# Patient Record
Sex: Female | Born: 1945
Health system: Southern US, Community
[De-identification: ages and names within clinical notes are randomized; demographics above are authoritative.]

## PROBLEM LIST (undated history)

## (undated) DIAGNOSIS — M1990 Unspecified osteoarthritis, unspecified site: Secondary | ICD-10-CM

## (undated) DIAGNOSIS — F32A Depression, unspecified: Secondary | ICD-10-CM

## (undated) DIAGNOSIS — I10 Essential (primary) hypertension: Secondary | ICD-10-CM

## (undated) DIAGNOSIS — E785 Hyperlipidemia, unspecified: Secondary | ICD-10-CM

## (undated) DIAGNOSIS — F419 Anxiety disorder, unspecified: Secondary | ICD-10-CM

## (undated) DIAGNOSIS — Z9889 Other specified postprocedural states: Secondary | ICD-10-CM

## (undated) DIAGNOSIS — D649 Anemia, unspecified: Secondary | ICD-10-CM

## (undated) DIAGNOSIS — Z86010 Personal history of colonic polyps: Principal | ICD-10-CM

## (undated) DIAGNOSIS — R112 Nausea with vomiting, unspecified: Secondary | ICD-10-CM

## (undated) DIAGNOSIS — F329 Major depressive disorder, single episode, unspecified: Secondary | ICD-10-CM

## (undated) HISTORY — DX: Essential (primary) hypertension: I10

## (undated) HISTORY — DX: Nausea with vomiting, unspecified: Z98.890

## (undated) HISTORY — DX: Anemia, unspecified: D64.9

## (undated) HISTORY — DX: Personal history of colonic polyps: Z86.010

## (undated) HISTORY — DX: Nausea with vomiting, unspecified: R11.2

## (undated) HISTORY — DX: Anxiety disorder, unspecified: F41.9

## (undated) HISTORY — DX: Hyperlipidemia, unspecified: E78.5

## (undated) HISTORY — DX: Depression, unspecified: F32.A

## (undated) HISTORY — DX: Major depressive disorder, single episode, unspecified: F32.9

## (undated) HISTORY — PX: TONSILLECTOMY: SUR1361

## (undated) HISTORY — PX: COLONOSCOPY: SHX174

---

## 2002-06-25 ENCOUNTER — Encounter: Payer: Self-pay | Admitting: Internal Medicine

## 2004-10-20 ENCOUNTER — Ambulatory Visit: Payer: Self-pay | Admitting: Internal Medicine

## 2004-12-01 ENCOUNTER — Ambulatory Visit: Payer: Self-pay | Admitting: Internal Medicine

## 2006-08-19 ENCOUNTER — Encounter: Admission: RE | Admit: 2006-08-19 | Discharge: 2006-08-19 | Payer: Self-pay | Admitting: Orthopedic Surgery

## 2006-09-05 ENCOUNTER — Ambulatory Visit: Payer: Self-pay | Admitting: Internal Medicine

## 2007-05-19 ENCOUNTER — Telehealth: Payer: Self-pay | Admitting: Internal Medicine

## 2007-05-22 DIAGNOSIS — F32A Depression, unspecified: Secondary | ICD-10-CM | POA: Insufficient documentation

## 2007-05-22 DIAGNOSIS — F329 Major depressive disorder, single episode, unspecified: Secondary | ICD-10-CM

## 2007-05-22 DIAGNOSIS — I10 Essential (primary) hypertension: Secondary | ICD-10-CM | POA: Insufficient documentation

## 2007-05-24 ENCOUNTER — Ambulatory Visit: Payer: Self-pay | Admitting: Internal Medicine

## 2007-05-26 LAB — CONVERTED CEMR LAB
BUN: 8 mg/dL (ref 6–23)
CO2: 30 meq/L (ref 19–32)
Calcium: 9.6 mg/dL (ref 8.4–10.5)
Chloride: 101 meq/L (ref 96–112)
Cholesterol: 235 mg/dL (ref 0–200)
Creatinine, Ser: 0.5 mg/dL (ref 0.4–1.2)
Direct LDL: 177.2 mg/dL
GFR calc Af Amer: 162 mL/min
GFR calc non Af Amer: 134 mL/min
Glucose, Bld: 104 mg/dL — ABNORMAL HIGH (ref 70–99)
HDL: 41.2 mg/dL (ref >39.0)
Potassium: 3.5 meq/L (ref 3.5–5.1)
Sodium: 139 meq/L (ref 135–145)
TSH: 2.2 microintl units/mL (ref 0.35–5.50)
Total CHOL/HDL Ratio: 5.7
Triglycerides: 113 mg/dL (ref 0–149)
VLDL: 23 mg/dL (ref 0–40)

## 2007-07-05 ENCOUNTER — Ambulatory Visit: Payer: Self-pay | Admitting: Internal Medicine

## 2007-09-29 ENCOUNTER — Telehealth: Payer: Self-pay | Admitting: Internal Medicine

## 2007-10-25 ENCOUNTER — Encounter: Payer: Self-pay | Admitting: Internal Medicine

## 2008-01-17 ENCOUNTER — Ambulatory Visit: Payer: Self-pay | Admitting: Internal Medicine

## 2008-03-06 ENCOUNTER — Ambulatory Visit: Payer: Self-pay | Admitting: Internal Medicine

## 2008-08-27 ENCOUNTER — Telehealth: Payer: Self-pay | Admitting: Internal Medicine

## 2008-08-29 ENCOUNTER — Telehealth: Payer: Self-pay | Admitting: Internal Medicine

## 2009-08-13 ENCOUNTER — Ambulatory Visit: Payer: Self-pay | Admitting: Internal Medicine

## 2009-08-13 ENCOUNTER — Telehealth: Payer: Self-pay | Admitting: Internal Medicine

## 2009-09-30 ENCOUNTER — Ambulatory Visit: Payer: Self-pay | Admitting: Internal Medicine

## 2009-09-30 DIAGNOSIS — E785 Hyperlipidemia, unspecified: Secondary | ICD-10-CM | POA: Insufficient documentation

## 2009-10-07 LAB — CONVERTED CEMR LAB
CO2: 30 meq/L (ref 19–32)
Direct LDL: 158.6 mg/dL
GFR calc non Af Amer: 107.4 mL/min (ref >60)
Glucose, Bld: 107 mg/dL — ABNORMAL HIGH (ref 70–99)
Potassium: 4 meq/L (ref 3.5–5.1)
Sodium: 139 meq/L (ref 135–145)
Total CHOL/HDL Ratio: 5

## 2009-12-09 ENCOUNTER — Encounter (INDEPENDENT_AMBULATORY_CARE_PROVIDER_SITE_OTHER): Payer: Self-pay | Admitting: *Deleted

## 2010-02-03 ENCOUNTER — Ambulatory Visit: Payer: Self-pay | Admitting: Internal Medicine

## 2010-02-03 DIAGNOSIS — J029 Acute pharyngitis, unspecified: Secondary | ICD-10-CM | POA: Insufficient documentation

## 2010-02-03 DIAGNOSIS — J020 Streptococcal pharyngitis: Secondary | ICD-10-CM | POA: Insufficient documentation

## 2010-02-03 LAB — CONVERTED CEMR LAB: Rapid Strep: POSITIVE

## 2010-11-10 NOTE — Assessment & Plan Note (Signed)
Summary: ?strep/req throat culture/cjr   Vital Signs:  Patient profile:   65 year old female Temp:     102.1 degrees F oral BP sitting:   130 / 80  (right arm) Cuff size:   regular  Vitals Entered By: Duard Brady LPN (February 03, 2010 4:10 PM) CC: c/o sore throat , painful swallowing , fever Is Patient Diabetic? No   CC:  c/o sore throat , painful swallowing , and fever.  History of Present Illness: 65 year old patient with a one day history of fever, sore throat, malaise.  She does have a prior history of strep  throat in the past.  She states as he feels her daughter is a carrier.  She does spend time with a grandchild as well.  A rapid stress screen was positive today. She has a history of hypertension, and depression, which have been stable  Allergies: 1)  ! Penicillin V Potassium (Penicillin V Potassium)  Past History:  Past Medical History: Reviewed history from 09/30/2009 and no changes required. Depression Hypertension Hyperlipidemia  Review of Systems       The patient complains of anorexia and fever.    Physical Exam  General:  overweight-appearing.  unwell but in no acute distress; temperature 102.1 Head:  Normocephalic and atraumatic without obvious abnormalities. No apparent alopecia or balding. Eyes:  No corneal or conjunctival inflammation noted. EOMI. Perrla. Funduscopic exam benign, without hemorrhages, exudates or papilledema. Vision grossly normal. Ears:  External ear exam shows no significant lesions or deformities.  Otoscopic examination reveals clear canals, tympanic membranes are intact bilaterally without bulging, retraction, inflammation or discharge. Hearing is grossly normal bilaterally. Nose:  External nasal examination shows no deformity or inflammation. Nasal mucosa are pink and moist without lesions or exudates. Mouth:  pharyngeal erythema.   Neck:  mild tender cervical adenopathy Lungs:  Normal respiratory effort, chest expands  symmetrically. Lungs are clear to auscultation, no crackles or wheezes.   Impression & Recommendations:  Problem # 1:  PHARYNGITIS, STREPTOCOCCAL, ACUTE (ICD-034.0)  Her updated medication list for this problem includes:    Azithromycin 250 Mg Tabs (Azithromycin) .Marland Kitchen..Marland Kitchen Two initially, then one daily for 4 additional days  Problem # 2:  HYPERTENSION (ICD-401.9)  Her updated medication list for this problem includes:    Hydrochlorothiazide 25 Mg Tabs (Hydrochlorothiazide) ..... One daily  Complete Medication List: 1)  Hydrochlorothiazide 25 Mg Tabs (Hydrochlorothiazide) .... One daily 2)  Lexapro 20 Mg Tabs (Escitalopram oxalate) .Marland Kitchen.. 1 by mouth daily 3)  Alprazolam 0.25 Mg Tabs (Alprazolam) .... Take 1 tablet by mouth once a day as needed anxiety 4)  Fluticasone Propionate 50 Mcg/act Susp (Fluticasone propionate) .... 2 sprays each nostril once daily 5)  Azithromycin 250 Mg Tabs (Azithromycin) .... Two initially, then one daily for 4 additional days 6)  Hydrocodone-acetaminophen 5-500 Mg Tabs (Hydrocodone-acetaminophen) .... One or two every 6 hours as needed for pain  Other Orders: Rapid Strep (21308)  Patient Instructions: 1)  Take 400-600mg  of Ibuprofen (Advil, Motrin) with food every 4-6 hours as needed for relief of pain or comfort of fever. 2)  Take your antibiotic as prescribed until ALL of it is gone, but stop if you develop a rash or swelling and contact our office as soon as possible. Prescriptions: FLUTICASONE PROPIONATE 50 MCG/ACT  SUSP (FLUTICASONE PROPIONATE) 2 sprays each nostril once daily  #1 vial x 3   Entered and Authorized by:   Gordy Savers  MD   Signed by:   Janett Labella  Amador Cunas  MD on 02/03/2010   Method used:   Electronically to        [facility]. [phone]* (retail)       [phone] [address]       [place], [place]  [address]       Ph: [phone]       Fax: [phone]   RxID:   [id number] AZITHROMYCIN 250 MG  TABS (AZITHROMYCIN) two initially, then one daily for 4 additional days  #6 x 0   Entered and Authorized by:   Gordy Savers  MD   Signed by:   Gordy Savers  MD on 02/03/2010   Method used:   Electronically to        [facility]. [phone]* (retail)       [phone] [address].       [place], [place]  [address]       Ph: [phone]       Fax: [phone]   RxID:   [id number] HYDROCODONE-ACETAMINOPHEN 5-500 MG TABS (HYDROCODONE-ACETAMINOPHEN) one or two every 6 hours as needed for pain  #40 x 0   Entered and Authorized by:   Gordy Savers  MD   Signed by:   Gordy Savers  MD on 02/03/2010   Method used:   Print then Give to Patient   RxID:   [id number] AZITHROMYCIN 250 MG TABS (AZITHROMYCIN) two initially, then one daily for 4 additional days  #6 x 0   Entered and Authorized by:   Gordy Savers  MD   Signed by:   Gordy Savers  MD on 02/03/2010   Method used:   Print then Give to Patient   RxID:   [id number]   Laboratory Results    Other Tests  Rapid Strep: positive Comments: Rita Ohara  February 03, 2010 4:25 PM   Kit Test Internal QC: Positive   (Normal Range: Negative)

## 2010-11-10 NOTE — Letter (Signed)
Summary: Referral - not able to see patient  [facility]  [address] [place], [place] [address]   Phone: [phone]  Fax: [phone]    December 09, 2009   Dr. Birdie Sons, M.D. [address] [place], [place] [address]   Re:   Cynthia Berry DOB:  09-23-1946 MRN:   [id number]    Dear Dr. Cato Mulligan:  Thank you for your kind referral of the above patient.  We have attempted to schedule the recommended procedure Screening Colonoscopy but have not been able to schedule because:   X  The patient was not available by phone and/or has not returned our calls.  ___ The patient declined to schedule the procedure at this time.  We appreciate the referral and hope that we will have the opportunity to treat this patient in the future.    Sincerely,    [facility] [phone]

## 2010-11-12 ENCOUNTER — Other Ambulatory Visit: Payer: Self-pay | Admitting: *Deleted

## 2010-11-12 DIAGNOSIS — I10 Essential (primary) hypertension: Secondary | ICD-10-CM

## 2010-11-12 DIAGNOSIS — F3289 Other specified depressive episodes: Secondary | ICD-10-CM

## 2010-11-12 DIAGNOSIS — F329 Major depressive disorder, single episode, unspecified: Secondary | ICD-10-CM

## 2010-11-12 MED ORDER — HYDROCHLOROTHIAZIDE 25 MG PO TABS: 25.0000 mg | ORAL_TABLET | Freq: Every day | ORAL | Status: DC

## 2010-11-12 MED ORDER — ESCITALOPRAM OXALATE 20 MG PO TABS: 20.0000 mg | ORAL_TABLET | Freq: Every day | ORAL | Status: DC

## 2010-12-18 ENCOUNTER — Telehealth: Payer: Self-pay | Admitting: Internal Medicine

## 2010-12-18 DIAGNOSIS — F329 Major depressive disorder, single episode, unspecified: Secondary | ICD-10-CM

## 2010-12-18 DIAGNOSIS — I10 Essential (primary) hypertension: Secondary | ICD-10-CM

## 2010-12-18 DIAGNOSIS — F3289 Other specified depressive episodes: Secondary | ICD-10-CM

## 2010-12-18 MED ORDER — HYDROCHLOROTHIAZIDE 25 MG PO TABS: 25.0000 mg | ORAL_TABLET | Freq: Every day | ORAL | Status: DC

## 2010-12-18 MED ORDER — ESCITALOPRAM OXALATE 20 MG PO TABS: 20.0000 mg | ORAL_TABLET | Freq: Every day | ORAL | Status: DC

## 2010-12-18 NOTE — Telephone Encounter (Signed)
rx sent in electronically 

## 2010-12-18 NOTE — Telephone Encounter (Signed)
Pt has sch a med check appt for 01/26/11 Pt needs refill of Lexapro 20 mg and HCTZ 25 mg. Pls call in to [facility] [place] on [address].

## 2011-01-25 ENCOUNTER — Encounter: Payer: Self-pay | Admitting: Internal Medicine

## 2011-01-26 ENCOUNTER — Encounter: Payer: Self-pay | Admitting: Internal Medicine

## 2011-01-26 ENCOUNTER — Telehealth: Payer: Self-pay | Admitting: Internal Medicine

## 2011-01-26 ENCOUNTER — Ambulatory Visit (INDEPENDENT_AMBULATORY_CARE_PROVIDER_SITE_OTHER): Payer: BLUE CROSS/BLUE SHIELD | Admitting: Internal Medicine

## 2011-01-26 ENCOUNTER — Other Ambulatory Visit: Payer: Self-pay | Admitting: Internal Medicine

## 2011-01-26 VITALS — BP 138/104 | HR 99 | Temp 102.0°F | Ht 63.0 in | Wt 218.0 lb

## 2011-01-26 DIAGNOSIS — E785 Hyperlipidemia, unspecified: Secondary | ICD-10-CM

## 2011-01-26 DIAGNOSIS — F3289 Other specified depressive episodes: Secondary | ICD-10-CM

## 2011-01-26 DIAGNOSIS — I10 Essential (primary) hypertension: Secondary | ICD-10-CM

## 2011-01-26 DIAGNOSIS — Z0000 Encounter for general adult medical examination without abnormal findings: Secondary | ICD-10-CM

## 2011-01-26 DIAGNOSIS — F329 Major depressive disorder, single episode, unspecified: Secondary | ICD-10-CM

## 2011-01-26 LAB — LIPID PANEL
Cholesterol: 243 mg/dL — ABNORMAL HIGH (ref 0–200)
HDL: 44 mg/dL (ref >39.00)
Triglycerides: 125 mg/dL (ref 0.0–149.0)

## 2011-01-26 LAB — BASIC METABOLIC PANEL
CO2: 28 mEq/L (ref 19–32)
Calcium: 9.5 mg/dL (ref 8.4–10.5)
Chloride: 99 mEq/L (ref 96–112)
Sodium: 139 mEq/L (ref 135–145)

## 2011-01-26 LAB — LDL CHOLESTEROL, DIRECT: Direct LDL: 180.8 mg/dL

## 2011-01-26 MED ORDER — FLUTICASONE PROPIONATE 50 MCG/ACT NA SUSP: 2.0000 | Freq: Every day | NASAL | Status: DC

## 2011-01-26 MED ORDER — ESCITALOPRAM OXALATE 20 MG PO TABS: 20.0000 mg | ORAL_TABLET | Freq: Every day | ORAL | Status: DC

## 2011-01-26 MED ORDER — ALPRAZOLAM 0.25 MG PO TABS: 0.2500 mg | ORAL_TABLET | Freq: Every day | ORAL | Status: DC | PRN

## 2011-01-26 NOTE — Telephone Encounter (Signed)
Called pharmacist and l/m 1 po qd #30 11 refills

## 2011-01-26 NOTE — Telephone Encounter (Signed)
Pharmacist called and needs clarification on Lexapro 20 mg. Instructions need clarification.

## 2011-01-26 NOTE — Telephone Encounter (Signed)
Pt called and said that Dr Cato Mulligan did not do a refill for pts HCTZ. Pls call in to [facility] at [place] asap.

## 2011-01-26 NOTE — Assessment & Plan Note (Signed)
Home BPS 130s/80s

## 2011-01-28 MED ORDER — SIMVASTATIN 20 MG PO TABS: 20.0000 mg | ORAL_TABLET | Freq: Every evening | ORAL | Status: DC

## 2011-01-28 NOTE — Progress Notes (Signed)
  Subjective:    Patient ID: Cynthia Berry, female    DOB: 1945-12-19, 65 y.o.   MRN: [id number]  HPI  Patient comes in for followup of hypertension, hyperlipidemia and depression. I have not seen her in quite some time. She is tolerating medications. She does not monitor her blood pressure at home. She has refused statin medications in the past. In regards to her depression she is feeling well. She has intermittent anxiety but rarely uses uppers ordered. No other specific complaints.  Past Medical History  Diagnosis Date  . Depression   . Hyperlipidemia   . Hypertension    No past surgical history on file.  reports that she has never smoked. She does not have any smokeless tobacco history on file. She reports that she drinks about .6 ounces of alcohol per week. Her drug history not on file. family history includes Heart disease (age of onset:51) in her father and Stroke (age of onset:89) in her mother. Allergies  Allergen Reactions  . Penicillins     REACTION: swelling,rash     Review of Systems    patient denies chest pain, shortness of breath, orthopnea. Denies lower extremity edema, abdominal pain, change in appetite, change in bowel movements. Patient denies rashes, musculoskeletal complaints. No other specific complaints in a complete review of systems.    Objective:   Physical Exam  Well-developed well-nourished female in no acute distress. HEENT exam atraumatic, normocephalic, extraocular muscles are intact. Neck is supple. No jugular venous distention no thyromegaly. Chest clear to auscultation without increased work of breathing. Cardiac exam S1 and S2 are regular. Abdominal exam active bowel sounds, soft, nontender, overweight. Extremities no edema. Neurologic exam she is alert without any motor sensory deficits. Gait is normal.        Assessment & Plan:

## 2011-01-28 NOTE — Assessment & Plan Note (Signed)
Tolerating medications. She rarely uses alprazolam. Continue same medications.

## 2011-01-28 NOTE — Assessment & Plan Note (Signed)
Patient needs follow. We'll check laboratory work today.

## 2011-02-24 ENCOUNTER — Other Ambulatory Visit: Payer: Self-pay | Admitting: Internal Medicine

## 2011-02-24 NOTE — Telephone Encounter (Signed)
Pt called req refill of HCTZ 25 mg to [facility] at [place].

## 2011-02-25 ENCOUNTER — Other Ambulatory Visit: Payer: Self-pay | Admitting: Internal Medicine

## 2011-02-26 ENCOUNTER — Telehealth: Payer: Self-pay | Admitting: Internal Medicine

## 2011-02-26 MED ORDER — HYDROCHLOROTHIAZIDE 25 MG PO TABS: 25.0000 mg | ORAL_TABLET | Freq: Every day | ORAL | Status: DC

## 2011-02-26 NOTE — Telephone Encounter (Signed)
Pt was told by [facility] that she would need to get ov before refill of HCTZ could be given. Pt also wondering why doctor is doing refills of on 30 day supply. Pt req to get 90 day supply with 1 yr refills to [facility] on [address].

## 2011-02-26 NOTE — Telephone Encounter (Signed)
Pt called back and is demanding that this script be taken care of today. Pls be sure to call this in today.

## 2011-04-01 ENCOUNTER — Encounter: Payer: Self-pay | Admitting: Internal Medicine

## 2011-06-10 ENCOUNTER — Other Ambulatory Visit: Payer: Self-pay | Admitting: Internal Medicine

## 2011-09-24 ENCOUNTER — Telehealth: Payer: Self-pay | Admitting: *Deleted

## 2011-09-24 MED ORDER — OSELTAMIVIR PHOSPHATE 75 MG PO CAPS: 75.0000 mg | ORAL_CAPSULE | Freq: Two times a day (BID) | ORAL | Status: AC

## 2011-09-24 NOTE — Telephone Encounter (Signed)
rx sent in electronically, pt aware 

## 2011-09-24 NOTE — Telephone Encounter (Signed)
tamiflu 75 mg po bid for 5 days. 

## 2011-09-24 NOTE — Telephone Encounter (Signed)
Fever 100.3, chills, no cough, no congestion, body aches, no sore throat. Glands feel sore. Pt feels like she has the flu and would like some tamiflu called in.

## 2012-01-31 ENCOUNTER — Ambulatory Visit (INDEPENDENT_AMBULATORY_CARE_PROVIDER_SITE_OTHER): Payer: Self-pay | Admitting: Family

## 2012-01-31 ENCOUNTER — Encounter: Payer: Self-pay | Admitting: Family

## 2012-01-31 VITALS — BP 138/70 | Temp 98.8°F | Wt 225.0 lb

## 2012-01-31 DIAGNOSIS — E785 Hyperlipidemia, unspecified: Secondary | ICD-10-CM

## 2012-01-31 DIAGNOSIS — M79676 Pain in unspecified toe(s): Secondary | ICD-10-CM

## 2012-01-31 DIAGNOSIS — M109 Gout, unspecified: Secondary | ICD-10-CM

## 2012-01-31 DIAGNOSIS — M79609 Pain in unspecified limb: Secondary | ICD-10-CM

## 2012-01-31 MED ORDER — INDOMETHACIN 25 MG PO CAPS: 25.0000 mg | ORAL_CAPSULE | Freq: Two times a day (BID) | ORAL | Status: DC

## 2012-01-31 MED ORDER — SIMVASTATIN 20 MG PO TABS: 20.0000 mg | ORAL_TABLET | Freq: Every evening | ORAL | Status: DC

## 2012-01-31 MED ORDER — HYDROCODONE-ACETAMINOPHEN 5-500 MG PO TABS: 1.0000 | ORAL_TABLET | Freq: Three times a day (TID) | ORAL | Status: AC | PRN

## 2012-01-31 MED ORDER — INDOMETHACIN 25 MG PO CAPS: ORAL_CAPSULE | ORAL | Status: DC

## 2012-01-31 MED ORDER — KETOROLAC TROMETHAMINE 30 MG/ML IJ SOLN
60.0000 mg | Freq: Once | INTRAMUSCULAR | Status: AC
  Administered 2012-01-31: 60 mg via INTRAMUSCULAR

## 2012-01-31 NOTE — Patient Instructions (Signed)
Gout Gout is an inflammatory condition (arthritis) caused by a buildup of uric acid crystals in the joints. Uric acid is a chemical that is normally present in the blood. Under some circumstances, uric acid can form into crystals in your joints. This causes joint redness, soreness, and swelling (inflammation). Repeat attacks are common. Over time, uric acid crystals can form into masses (tophi) near a joint, causing disfigurement. Gout is treatable and often preventable. CAUSES  The disease begins with elevated levels of uric acid in the blood. Uric acid is produced by your body when it breaks down a naturally found substance called purines. This also happens when you eat certain foods such as meats and fish. Causes of an elevated uric acid level include:  Being passed down from parent to child (heredity).   Diseases that cause increased uric acid production (obesity, psoriasis, some cancers).   Excessive alcohol use.   Diet, especially diets rich in meat and seafood.   Medicines, including certain cancer-fighting drugs (chemotherapy), diuretics, and aspirin.   Chronic kidney disease. The kidneys are no longer able to remove uric acid well.   Problems with metabolism.  Conditions strongly associated with gout include:  Obesity.   High blood pressure.   High cholesterol.   Diabetes.  Not everyone with elevated uric acid levels gets gout. It is not understood why some people get gout and others do not. Surgery, joint injury, and eating too much of certain foods are some of the factors that can lead to gout. SYMPTOMS   An attack of gout comes on quickly. It causes intense pain with redness, swelling, and warmth in a joint.   Fever can occur.   Often, only one joint is involved. Certain joints are more commonly involved:   Base of the big toe.   Knee.   Ankle.   Wrist.   Finger.  Without treatment, an attack usually goes away in a few days to weeks. Between attacks, you  usually will not have symptoms, which is different from many other forms of arthritis. DIAGNOSIS  Your caregiver will suspect gout based on your symptoms and exam. Removal of fluid from the joint (arthrocentesis) is done to check for uric acid crystals. Your caregiver will give you a medicine that numbs the area (local anesthetic) and use a needle to remove joint fluid for exam. Gout is confirmed when uric acid crystals are seen in joint fluid, using a special microscope. Sometimes, blood, urine, and X-ray tests are also used. TREATMENT  There are 2 phases to gout treatment: treating the sudden onset (acute) attack and preventing attacks (prophylaxis). Treatment of an Acute Attack  Medicines are used. These include anti-inflammatory medicines or steroid medicines.   An injection of steroid medicine into the affected joint is sometimes necessary.   The painful joint is rested. Movement can worsen the arthritis.   You may use warm or cold treatments on painful joints, depending which works best for you.   Discuss the use of coffee, vitamin C, or cherries with your caregiver. These may be helpful treatment options.  Treatment to Prevent Attacks After the acute attack subsides, your caregiver may advise prophylactic medicine. These medicines either help your kidneys eliminate uric acid from your body or decrease your uric acid production. You may need to stay on these medicines for a very long time. The early phase of treatment with prophylactic medicine can be associated with an increase in acute gout attacks. For this reason, during the first few months   of treatment, your caregiver may also advise you to take medicines usually used for acute gout treatment. Be sure you understand your caregiver's directions. You should also discuss dietary treatment with your caregiver. Certain foods such as meats and fish can increase uric acid levels. Other foods such as dairy can decrease levels. Your caregiver  can give you a list of foods to avoid. HOME CARE INSTRUCTIONS   Do not take aspirin to relieve pain. This raises uric acid levels.   Only take over-the-counter or prescription medicines for pain, discomfort, or fever as directed by your caregiver.   Rest the joint as much as possible. When in bed, keep sheets and blankets off painful areas.   Keep the affected joint raised (elevated).   Use crutches if the painful joint is in your leg.   Drink enough water and fluids to keep your urine clear or pale yellow. This helps your body get rid of uric acid. Do not drink alcoholic beverages. They slow the passage of uric acid.   Follow your caregiver's dietary instructions. Pay careful attention to the amount of protein you eat. Your daily diet should emphasize fruits, vegetables, whole grains, and fat-free or low-fat milk products.   Maintain a healthy body weight.  SEEK MEDICAL CARE IF:   You have an oral temperature above 102 F (38.9 C).   You develop diarrhea, vomiting, or any side effects from medicines.   You do not feel better in 24 hours, or you are getting worse.  SEEK IMMEDIATE MEDICAL CARE IF:   Your joint becomes suddenly more tender and you have:   Chills.   An oral temperature above 102 F (38.9 C), not controlled by medicine.  MAKE SURE YOU:   Understand these instructions.   Will watch your condition.   Will get help right away if you are not doing well or get worse.  Document Released: 09/24/2000 Document Revised: 09/16/2011 Document Reviewed: 01/05/2010 ExitCare Patient Information 2012 [employer]. 

## 2012-01-31 NOTE — Progress Notes (Signed)
Subjective:    Patient ID: Cynthia Berry, female    DOB: October 12, 1945, 66 y.o.   MRN: [id number]  HPI 66 year old female, patient of Dr. Abelina Bachelor is in today with left great toe pain this morning for 2 days. Reports swelling, tenderness to touch, decreased range of motion, and redness. She has not taken any medication for her symptoms. Denies any history of gout. Does have a history of arthritis. Patient denies any lightheadedness, dizziness, chest pain, palpitations, shortness of breath or edema.   Review of Systems  Constitutional: Negative.   Respiratory: Negative.   Cardiovascular: Negative.   Musculoskeletal: Negative for back pain.       Left great toe pain  Skin: Positive for color change.  Neurological: Negative.   Hematological: Negative.   Psychiatric/Behavioral: Negative.    Past Medical History  Diagnosis Date  . Depression   . Hyperlipidemia   . Hypertension     History   Social History  . Marital Status: Married    Spouse Name: N/A    Number of Children: N/A  . Years of Education: N/A   Occupational History  . Not on file.   Social History Main Topics  . Smoking status: Never Smoker   . Smokeless tobacco: Not on file  . Alcohol Use: 0.6 oz/week    1 Glasses of wine per week  . Drug Use: Not on file  . Sexually Active: Not on file   Other Topics Concern  . Not on file   Social History Narrative  . No narrative on file    No past surgical history on file.  Family History  Problem Relation Age of Onset  . Stroke Mother 21  . Heart disease Father 15    mi    Allergies  Allergen Reactions  . Penicillins     REACTION: swelling,rash    Current Outpatient Prescriptions on File Prior to Visit  Medication Sig Dispense Refill  . ALPRAZolam (XANAX) 0.25 MG tablet take 1 tablet by mouth every 4 hours if needed for anxiety  60 tablet  1  . fluticasone (FLONASE) 50 MCG/ACT nasal spray 2 sprays by Nasal route daily.  16 g  11  . hydrochlorothiazide 25 MG  tablet Take 1 tablet (25 mg total) by mouth daily.  90 tablet  3  . escitalopram (LEXAPRO) 20 MG tablet Take 1 tablet (20 mg total) by mouth daily. Taking 1/2 daily   30 tablet  11  . simvastatin (ZOCOR) 20 MG tablet Take 1 tablet (20 mg total) by mouth every evening.  30 tablet  11   No current facility-administered medications on file prior to visit.    BP 138/70  Temp(Src) 98.8 F (37.1 C) (Oral)  Wt 225 lb (102.059 kg)chart    Objective:   Physical Exam  Constitutional: She is oriented to person, place, and time. She appears well-developed and well-nourished.  HENT:  Right Ear: External ear normal.  Left Ear: External ear normal.  Nose: Nose normal.  Mouth/Throat: Oropharynx is clear and moist.  Cardiovascular: Normal rate and normal heart sounds.   Pulmonary/Chest: Effort normal and breath sounds normal.  Musculoskeletal:       Left great toe pain  Neurological: She is alert and oriented to person, place, and time.  Skin: Skin is warm and dry.  Psychiatric: She has a normal mood and affect.      Toradol 60 mg IM x1    Assessment & Plan:  Assessment: Left great toe pain,  Gout-likely  Plan: Uric acid level stents. Indocin 25 mg 2 capsules by mouth 3 times a day x48 hours, then one tablet every 8 hours until pain resolves. Vicodin one every 8 hours when necessary pain. Patient to call the office if her symptoms worsen or persist. Schedule an appointment for fasting labs. 30 day supply of her cholestero given today. Recheck with her PCP ASAP.

## 2012-02-02 ENCOUNTER — Telehealth: Payer: Self-pay | Admitting: Family

## 2012-02-02 NOTE — Telephone Encounter (Signed)
Pt aware.

## 2012-02-02 NOTE — Telephone Encounter (Signed)
Pt needs blood work results °

## 2012-02-24 ENCOUNTER — Other Ambulatory Visit: Payer: Self-pay | Admitting: Internal Medicine

## 2012-06-15 ENCOUNTER — Other Ambulatory Visit: Payer: Self-pay | Admitting: Internal Medicine

## 2012-10-24 ENCOUNTER — Other Ambulatory Visit: Payer: Self-pay | Admitting: Internal Medicine

## 2012-12-21 ENCOUNTER — Ambulatory Visit (INDEPENDENT_AMBULATORY_CARE_PROVIDER_SITE_OTHER): Payer: Medicare Other | Admitting: Emergency Medicine

## 2012-12-21 VITALS — BP 172/100 | HR 105 | Temp 98.2°F | Resp 20 | Ht 62.5 in | Wt 223.0 lb

## 2012-12-21 DIAGNOSIS — H1030 Unspecified acute conjunctivitis, unspecified eye: Secondary | ICD-10-CM

## 2012-12-21 MED ORDER — OFLOXACIN 0.3 % OP SOLN: 1.0000 [drp] | OPHTHALMIC | Status: DC

## 2012-12-21 NOTE — Progress Notes (Signed)
[facility] [address], [place] [address] [phone]  Date:  12/21/2012   Name:  Cynthia Berry   DOB:  1946-01-01   MRN:  [id number]  PCP:  Judie Petit, MD    Chief Complaint: Eye Pain   History of Present Illness:  Cynthia Berry is a 67 y.o. very pleasant female patient who presents with the following:  History of "dry eyes" and is not compliant with the use of her drops.  Has had inflammation of her right eye for the past 4 days that has culminated in her developing a red, swollen, draining eye that was glued in the morning on the past 2 days.  She has no visual symptoms and no sick contacts.  No fever or chills.  No cough or coryza.  Denies other complaint or health concern today.  No improvement with over the counter medications or other home remedies.   Patient Active Problem List  Diagnosis  . HYPERLIPIDEMIA  . DEPRESSION  . HYPERTENSION    Past Medical History  Diagnosis Date  . Depression   . Hyperlipidemia   . Hypertension   . Anxiety     History reviewed. No pertinent past surgical history.  History  Substance Use Topics  . Smoking status: Never Smoker   . Smokeless tobacco: Not on file  . Alcohol Use: 0.6 oz/week    1 Glasses of wine per week    Family History  Problem Relation Age of Onset  . Stroke Mother 62  . Heart disease Father 14    mi    Allergies  Allergen Reactions  . Penicillins     REACTION: swelling,rash    Medication list has been reviewed and updated.  Current Outpatient Prescriptions on File Prior to Visit  Medication Sig Dispense Refill  . escitalopram (LEXAPRO) 20 MG tablet take 1 tablet by mouth once daily  30 tablet  11  . hydrochlorothiazide (HYDRODIURIL) 25 MG tablet take 1 tablet by mouth once daily  90 tablet  3  . ALPRAZolam (XANAX) 0.25 MG tablet take 1 tablet by mouth every 4 hours if needed for anxiety  60 tablet  1  . fluticasone (FLONASE) 50 MCG/ACT nasal spray 2 sprays by Nasal  route daily.  16 g  11  . indomethacin (INDOCIN) 25 MG capsule 2 caps po TID x 48 hours then 1 cap po TID until pain resolves.  45 capsule  1  . simvastatin (ZOCOR) 20 MG tablet take 1 tablet by mouth every evening  30 tablet  0   No current facility-administered medications on file prior to visit.    Review of Systems:  As per HPI, otherwise negative.    Physical Examination: Filed Vitals:   12/21/12 2044  BP: 172/100  Pulse: 105  Temp: 98.2 F (36.8 C)  Resp: 20   Filed Vitals:   12/21/12 2044  Height: 5' 2.5" (1.588 m)  Weight: 223 lb (101.152 kg)   Body mass index is 40.11 kg/(m^2). Ideal Body Weight: Weight in (lb) to have BMI = 25: 138.6   GEN: WDWN, NAD, Non-toxic, Alert & Oriented x 3 HEENT: Atraumatic, Normocephalic. Right eye injected sclera and draining.  Cornea clear.  PRRERLA EOMI no hypopion.  No foreign body Ears and Nose: No external deformity. EXTR: No clubbing/cyanosis/edema NEURO: Normal gait.  PSYCH: Normally interactive. Conversant. Not depressed or anxious appearing.  Calm demeanor.    Assessment and Plan: Conjunctivitis Ocuflox Follow up as needed  Carmelina Dane, MD

## 2012-12-21 NOTE — Patient Instructions (Addendum)
Conjunctivitis Conjunctivitis is commonly called "pink eye." Conjunctivitis can be caused by bacterial or viral infection, allergies, or injuries. There is usually redness of the lining of the eye, itching, discomfort, and sometimes discharge. There may be deposits of matter along the eyelids. A viral infection usually causes a watery discharge, while a bacterial infection causes a yellowish, thick discharge. Pink eye is very contagious and spreads by direct contact. You may be given antibiotic eyedrops as part of your treatment. Before using your eye medicine, remove all drainage from the eye by washing gently with warm water and cotton balls. Continue to use the medication until you have awakened 2 mornings in a row without discharge from the eye. Do not rub your eye. This increases the irritation and helps spread infection. Use separate towels from other household members. Wash your hands with soap and water before and after touching your eyes. Use cold compresses to reduce pain and sunglasses to relieve irritation from light. Do not wear contact lenses or wear eye makeup until the infection is gone. SEEK MEDICAL CARE IF:   Your symptoms are not better after 3 days of treatment.  You have increased pain or trouble seeing.  The outer eyelids become very red or swollen. Document Released: 11/04/2004 Document Revised: 12/20/2011 Document Reviewed: 09/27/2005 ExitCare Patient Information 2013 [employer].  

## 2012-12-24 ENCOUNTER — Telehealth: Payer: Self-pay

## 2012-12-24 NOTE — Telephone Encounter (Signed)
WAS IN Thursday 12/21/2012 FOR PINK EYE. PT SAW DR Dareen Piano. PT STATES THAT HER CONDITION IS NOT GETTING BETTER AND WOULD LIKE ANOTHER RX  [phone]

## 2012-12-24 NOTE — Telephone Encounter (Signed)
Pt states that her eye is no better and is swollen.  Per Dr. Dareen Piano pt to call her eye dr Monday morning.  If she cannot get in to call and let us know.

## 2013-01-18 ENCOUNTER — Other Ambulatory Visit: Payer: Self-pay | Admitting: Internal Medicine

## 2013-01-22 ENCOUNTER — Telehealth: Payer: Self-pay | Admitting: Internal Medicine

## 2013-01-22 MED ORDER — ALPRAZOLAM 0.25 MG PO TABS: ORAL_TABLET | ORAL | Status: DC

## 2013-01-22 MED ORDER — ESCITALOPRAM OXALATE 20 MG PO TABS: ORAL_TABLET | ORAL | Status: DC

## 2013-01-22 NOTE — Telephone Encounter (Signed)
Ok to see [employer]

## 2013-01-22 NOTE — Telephone Encounter (Signed)
Pt has made office visit for next Mon @ 9:30am. Pt would like to request 5-6  Alprazolam to get her through until appt. [facility]

## 2013-01-22 NOTE — Telephone Encounter (Signed)
rx's called in . 

## 2013-01-22 NOTE — Telephone Encounter (Addendum)
Pt also needs escitalopram (LEXAPRO) 20 MG tablet added to this . Pt called back and would like to see someone sooner. Pt states she is feeling anxious.  Pt is taking a trip at the end of the month, and would like to see someone before the appt in June.  Is it ok to see Padonda?

## 2013-01-22 NOTE — Telephone Encounter (Addendum)
Pt needs refill on alprazolam call into [facility] [address]. Pt has appt sch for 03-19-13

## 2013-01-24 ENCOUNTER — Other Ambulatory Visit: Payer: Self-pay

## 2013-01-24 DIAGNOSIS — Z1231 Encounter for screening mammogram for malignant neoplasm of breast: Secondary | ICD-10-CM

## 2013-01-24 NOTE — Telephone Encounter (Signed)
Pt aware.

## 2013-01-29 ENCOUNTER — Encounter: Payer: Self-pay | Admitting: Family

## 2013-01-29 ENCOUNTER — Ambulatory Visit (INDEPENDENT_AMBULATORY_CARE_PROVIDER_SITE_OTHER): Payer: Medicare Other | Admitting: Family

## 2013-01-29 VITALS — BP 134/90 | HR 105 | Wt 219.0 lb

## 2013-01-29 DIAGNOSIS — I10 Essential (primary) hypertension: Secondary | ICD-10-CM

## 2013-01-29 DIAGNOSIS — E669 Obesity, unspecified: Secondary | ICD-10-CM

## 2013-01-29 DIAGNOSIS — E785 Hyperlipidemia, unspecified: Secondary | ICD-10-CM

## 2013-01-29 DIAGNOSIS — F411 Generalized anxiety disorder: Secondary | ICD-10-CM

## 2013-01-29 LAB — LIPID PANEL
HDL: 39 mg/dL — ABNORMAL LOW (ref >39.00)
Total CHOL/HDL Ratio: 5
Triglycerides: 117 mg/dL (ref 0.0–149.0)
VLDL: 23.4 mg/dL (ref 0.0–40.0)

## 2013-01-29 LAB — HEPATIC FUNCTION PANEL
AST: 16 U/L (ref 0–37)
Alkaline Phosphatase: 83 U/L (ref 39–117)
Bilirubin, Direct: 0 mg/dL (ref 0.0–0.3)
Total Bilirubin: 0.6 mg/dL (ref 0.3–1.2)

## 2013-01-29 LAB — CBC WITH DIFFERENTIAL/PLATELET
Basophils Absolute: 0.1 10*3/uL (ref 0.0–0.1)
Basophils Relative: 0.7 % (ref 0.0–3.0)
Eosinophils Absolute: 0.1 10*3/uL (ref 0.0–0.7)
HCT: 39.6 % (ref 36.0–46.0)
Hemoglobin: 13.1 g/dL (ref 12.0–15.0)
Lymphocytes Relative: 28.2 % (ref 12.0–46.0)
Lymphs Abs: 2.2 10*3/uL (ref 0.7–4.0)
MCHC: 33.2 g/dL (ref 30.0–36.0)
MCV: 87.2 fl (ref 78.0–100.0)
Neutro Abs: 4.7 10*3/uL (ref 1.4–7.7)
RBC: 4.54 Mil/uL (ref 3.87–5.11)
RDW: 13.1 % (ref 11.5–14.6)

## 2013-01-29 LAB — BASIC METABOLIC PANEL
Calcium: 9.4 mg/dL (ref 8.4–10.5)
GFR: 104.27 mL/min (ref >60.00)
Glucose, Bld: 99 mg/dL (ref 70–99)
Potassium: 4.3 mEq/L (ref 3.5–5.1)
Sodium: 138 mEq/L (ref 135–145)

## 2013-01-29 NOTE — Progress Notes (Signed)
Subjective:    Patient ID: Cynthia Berry, female    DOB: 1946-06-27, 67 y.o.   MRN: [id number]  HPI 67 year old WF, nonsmoker, is in today with c/o depression and anxiousness. She called in last week and requested an appointment. She has been taking Lexapro and Klonopin as needed. She reports feeling better. Patient reports having a sister sick with lung cancer. Another sister is also ill. She has had knee pain requiring surgery in November. Her daughter also had a fall fracturing her tailbone and miscarriage. She denies feelings of helplessness or hopelessness.    Review of Systems  Constitutional: Negative.   Respiratory: Negative.   Cardiovascular: Negative.   Gastrointestinal: Negative.   Genitourinary: Negative.   Musculoskeletal: Negative.   Skin: Negative.   Neurological: Negative.   Hematological: Negative.   Psychiatric/Behavioral: Negative for sleep disturbance. The patient is nervous/anxious.    Past Medical History  Diagnosis Date  . Depression   . Hyperlipidemia   . Hypertension   . Anxiety     History   Social History  . Marital Status: Married    Spouse Name: N/A    Number of Children: N/A  . Years of Education: N/A   Occupational History  . Not on file.   Social History Main Topics  . Smoking status: Never Smoker   . Smokeless tobacco: Not on file  . Alcohol Use: 0.6 oz/week    1 Glasses of wine per week  . Drug Use: No  . Sexually Active: No   Other Topics Concern  . Not on file   Social History Narrative  . No narrative on file    No past surgical history on file.  Family History  Problem Relation Age of Onset  . Stroke Mother 51  . Heart disease Father 18    mi    Allergies  Allergen Reactions  . Penicillins     REACTION: swelling,rash    Current Outpatient Prescriptions on File Prior to Visit  Medication Sig Dispense Refill  . ALPRAZolam (XANAX) 0.25 MG tablet take 1 tablet by mouth every 4 hours if needed for anxiety  60 tablet   0  . escitalopram (LEXAPRO) 20 MG tablet take 1 tablet by mouth once daily  30 tablet  0  . fluticasone (FLONASE) 50 MCG/ACT nasal spray 2 sprays by Nasal route daily.  16 g  11  . hydrochlorothiazide (HYDRODIURIL) 25 MG tablet take 1 tablet by mouth once daily  90 tablet  3  . indomethacin (INDOCIN) 25 MG capsule 2 caps po TID x 48 hours then 1 cap po TID until pain resolves.  45 capsule  1  . ofloxacin (OCUFLOX) 0.3 % ophthalmic solution Place 1 drop into the right eye every 4 (four) hours.  5 mL  0  . simvastatin (ZOCOR) 20 MG tablet take 1 tablet by mouth every evening  30 tablet  0   No current facility-administered medications on file prior to visit.    BP 134/90  Pulse 105  Wt 219 lb (99.338 kg)  BMI 39.39 kg/m2  SpO2 95%chart    Objective:   Physical Exam  Constitutional: She is oriented to person, place, and time. She appears well-developed and well-nourished.  Neck: Normal range of motion. Neck supple. No thyromegaly present.  Cardiovascular: Normal rate, regular rhythm and normal heart sounds.   Pulmonary/Chest: Effort normal and breath sounds normal.  Abdominal: Soft. Bowel sounds are normal.  Musculoskeletal: Normal range of motion.  Neurological:  She is alert and oriented to person, place, and time.  Skin: Skin is warm and dry.  Psychiatric: She has a normal mood and affect.          Assessment & Plan:  Assessment:  1.  Depression 2. Anxiety 3. Hypertension  Plan: Continue Lexapro 10mg  once daily and Klonopin as needed. Exercise 30 min daily. Call the office if symptoms worsen or persist. Recheck in 1 month and sooner as needed.

## 2013-01-29 NOTE — Patient Instructions (Signed)

## 2013-02-20 ENCOUNTER — Ambulatory Visit
Admission: RE | Admit: 2013-02-20 | Discharge: 2013-02-20 | Disposition: A | Discharge status: Home / Self Care | Payer: Medicare Other | Source: Ambulatory Visit

## 2013-02-20 DIAGNOSIS — Z1231 Encounter for screening mammogram for malignant neoplasm of breast: Secondary | ICD-10-CM

## 2013-03-19 ENCOUNTER — Ambulatory Visit: Payer: Medicare Other | Admitting: Internal Medicine

## 2013-04-17 ENCOUNTER — Other Ambulatory Visit: Payer: Self-pay | Admitting: Internal Medicine

## 2013-04-17 NOTE — Telephone Encounter (Signed)
You have not seen this pt since 2012 but Padonda seen her in April 2014

## 2013-06-04 ENCOUNTER — Other Ambulatory Visit: Payer: Self-pay | Admitting: Internal Medicine

## 2013-07-19 ENCOUNTER — Other Ambulatory Visit: Payer: Self-pay | Admitting: Internal Medicine

## 2013-07-21 ENCOUNTER — Other Ambulatory Visit: Payer: Self-pay | Admitting: Internal Medicine

## 2013-07-23 ENCOUNTER — Encounter: Payer: Self-pay | Admitting: Family

## 2013-07-25 ENCOUNTER — Other Ambulatory Visit: Payer: Self-pay | Admitting: Internal Medicine

## 2013-07-25 ENCOUNTER — Other Ambulatory Visit: Payer: Self-pay | Admitting: Family

## 2013-07-25 DIAGNOSIS — E7800 Pure hypercholesterolemia, unspecified: Secondary | ICD-10-CM

## 2013-07-26 ENCOUNTER — Other Ambulatory Visit: Payer: Self-pay | Admitting: *Deleted

## 2013-07-26 MED ORDER — ESCITALOPRAM OXALATE 20 MG PO TABS: ORAL_TABLET | ORAL | Status: DC

## 2013-07-26 MED ORDER — HYDROCHLOROTHIAZIDE 25 MG PO TABS: ORAL_TABLET | ORAL | Status: DC

## 2013-07-26 MED ORDER — ALPRAZOLAM 0.25 MG PO TABS: ORAL_TABLET | ORAL | Status: DC

## 2013-08-16 ENCOUNTER — Other Ambulatory Visit: Payer: Self-pay

## 2013-08-28 ENCOUNTER — Encounter: Payer: Self-pay | Admitting: *Deleted

## 2013-08-29 ENCOUNTER — Other Ambulatory Visit (INDEPENDENT_AMBULATORY_CARE_PROVIDER_SITE_OTHER): Payer: Medicare Other

## 2013-08-29 ENCOUNTER — Telehealth: Payer: Self-pay | Admitting: Internal Medicine

## 2013-08-29 ENCOUNTER — Other Ambulatory Visit: Payer: Self-pay | Admitting: Family

## 2013-08-29 ENCOUNTER — Telehealth: Payer: Self-pay | Admitting: Family

## 2013-08-29 DIAGNOSIS — E7800 Pure hypercholesterolemia, unspecified: Secondary | ICD-10-CM

## 2013-08-29 LAB — BASIC METABOLIC PANEL
Calcium: 9.7 mg/dL (ref 8.4–10.5)
Creatinine, Ser: 0.6 mg/dL (ref 0.4–1.2)
GFR: 106.09 mL/min (ref >60.00)
Potassium: 4.1 mEq/L (ref 3.5–5.1)
Sodium: 140 mEq/L (ref 135–145)

## 2013-08-29 LAB — HEPATIC FUNCTION PANEL
AST: 16 U/L (ref 0–37)
Alkaline Phosphatase: 59 U/L (ref 39–117)
Bilirubin, Direct: 0 mg/dL (ref 0.0–0.3)
Total Bilirubin: 0.9 mg/dL (ref 0.3–1.2)

## 2013-08-29 LAB — LIPID PANEL
HDL: 42.6 mg/dL (ref >39.00)
Total CHOL/HDL Ratio: 6
Triglycerides: 133 mg/dL (ref 0.0–149.0)
VLDL: 26.6 mg/dL (ref 0.0–40.0)

## 2013-08-29 LAB — LDL CHOLESTEROL, DIRECT: Direct LDL: 184.4 mg/dL

## 2013-08-29 MED ORDER — HYDROCHLOROTHIAZIDE 25 MG PO TABS: ORAL_TABLET | ORAL | Status: DC

## 2013-08-29 MED ORDER — ESCITALOPRAM OXALATE 20 MG PO TABS: ORAL_TABLET | ORAL | Status: DC

## 2013-08-29 MED ORDER — ALPRAZOLAM 0.25 MG PO TABS: ORAL_TABLET | ORAL | Status: DC

## 2013-08-29 NOTE — Telephone Encounter (Signed)
lmom for pt to return my call. 

## 2013-08-29 NOTE — Telephone Encounter (Signed)
Advise patient to schedule appt to discuss labs with me.

## 2013-08-29 NOTE — Telephone Encounter (Signed)
done

## 2013-08-29 NOTE — Telephone Encounter (Signed)
hydrochlorothiazide (HYDRODIURIL) 25 MG tablet escitalopram (LEXAPRO) 20 MG tablet ALPRAZolam (XANAX) 0.25 MG tablet  Pt had lab work drawn today,08/29/13 and would like her re-fills sent to [facility] on [address].

## 2013-08-30 NOTE — Telephone Encounter (Signed)
Pt scheduled appt 11/21 at 9:45 to discuss those labs. Will be here at 9:30 am

## 2013-08-31 ENCOUNTER — Ambulatory Visit (INDEPENDENT_AMBULATORY_CARE_PROVIDER_SITE_OTHER): Payer: Medicare Other | Admitting: Family

## 2013-08-31 ENCOUNTER — Encounter: Payer: Self-pay | Admitting: Family

## 2013-08-31 VITALS — BP 128/88 | HR 108

## 2013-08-31 DIAGNOSIS — R7309 Other abnormal glucose: Secondary | ICD-10-CM

## 2013-08-31 DIAGNOSIS — R739 Hyperglycemia, unspecified: Secondary | ICD-10-CM | POA: Insufficient documentation

## 2013-08-31 DIAGNOSIS — I10 Essential (primary) hypertension: Secondary | ICD-10-CM

## 2013-08-31 DIAGNOSIS — F411 Generalized anxiety disorder: Secondary | ICD-10-CM

## 2013-08-31 MED ORDER — SIMVASTATIN 20 MG PO TABS: 20.0000 mg | ORAL_TABLET | Freq: Every day | ORAL | Status: DC

## 2013-08-31 NOTE — Patient Instructions (Signed)

## 2013-08-31 NOTE — Progress Notes (Signed)
Pre visit review using our clinic review tool, if applicable. No additional management support is needed unless otherwise documented below in the visit note. 

## 2013-08-31 NOTE — Progress Notes (Signed)
Subjective:    Patient ID: Cynthia Berry, female    DOB: [birth year], 67 y.o.   MRN: [id number]  HPI 67 year old white female, nonsmoker, patient of Dr. Cato Mulligan is in today to discuss labs. Her last labs showed a blood glucose fasting of 122 and elevated cholesterol. She had been on simvastatin in the past but has not been taking it. She is not following any particular diet or exercising. Has a family history of type 2 diabetes.   Review of Systems  Constitutional: Negative.   Respiratory: Negative.   Cardiovascular: Negative.   Gastrointestinal: Negative.   Endocrine: Negative.  Negative for polydipsia, polyphagia and polyuria.  Genitourinary: Negative.   Musculoskeletal: Negative.   Skin: Negative.   Neurological: Negative.   Hematological: Negative.   Psychiatric/Behavioral: Negative.    Past Medical History  Diagnosis Date  . Depression   . Hyperlipidemia   . Hypertension   . Anxiety     History   Social History  . Marital Status: Married    Spouse Name: N/A    Number of Children: N/A  . Years of Education: N/A   Occupational History  . Not on file.   Social History Main Topics  . Smoking status: Never Smoker   . Smokeless tobacco: Not on file  . Alcohol Use: 0.6 oz/week    1 Glasses of wine per week  . Drug Use: No  . Sexual Activity: No   Other Topics Concern  . Not on file   Social History Narrative  . No narrative on file    History reviewed. No pertinent past surgical history.  Family History  Problem Relation Age of Onset  . Stroke Mother 73  . Heart disease Father 53    mi    Allergies  Allergen Reactions  . Penicillins     REACTION: swelling,rash    Current Outpatient Prescriptions on File Prior to Visit  Medication Sig Dispense Refill  . ALPRAZolam (XANAX) 0.25 MG tablet 1 tab twice daily as needed  60 tablet  1  . escitalopram (LEXAPRO) 20 MG tablet take 1 tablet by mouth once daily  30 tablet  4  . hydrochlorothiazide (HYDRODIURIL)  25 MG tablet take 1 tablet by mouth once daily  30 tablet  4  . ILEVRO 0.3 % SUSP       . ofloxacin (OCUFLOX) 0.3 % ophthalmic solution Place 1 drop into the right eye every 4 (four) hours.  5 mL  0  . fluticasone (FLONASE) 50 MCG/ACT nasal spray 2 sprays by Nasal route daily.  16 g  11  . indomethacin (INDOCIN) 25 MG capsule 2 caps po TID x 48 hours then 1 cap po TID until pain resolves.  45 capsule  1   No current facility-administered medications on file prior to visit.    BP 128/88  Pulse 108  SpO2 98%chart    Objective:   Physical Exam  Constitutional: She is oriented to person, place, and time. She appears well-developed and well-nourished.  Neck: Normal range of motion. Neck supple. No thyromegaly present.  Cardiovascular: Normal rate, regular rhythm and normal heart sounds.   Pulmonary/Chest: Effort normal and breath sounds normal.  Abdominal: Soft. Bowel sounds are normal.  Musculoskeletal: Normal range of motion.  Neurological: She is alert and oriented to person, place, and time. She has normal reflexes.  Skin: Skin is warm and dry.  Psychiatric: She has a normal mood and affect.  Assessment & Plan:  Assessment: 1. Hyperglycemia 2. Hypertension 3. Obesity  Plan: Encouraged healthy diet, exercise, weight reduction. A1c sent today. Restart simvastatin 20 mg once daily. Recheck with Dr. Cato Mulligan next month. Patient will return fasting so she can have her cholesterol rechecked at that point and medications adjusted if necessary. Call the office with any questions or concerns. Diabetes education given today.

## 2013-09-11 ENCOUNTER — Other Ambulatory Visit: Payer: Self-pay | Admitting: Internal Medicine

## 2013-09-17 ENCOUNTER — Ambulatory Visit (INDEPENDENT_AMBULATORY_CARE_PROVIDER_SITE_OTHER): Payer: Medicare Other | Admitting: Internal Medicine

## 2013-09-17 DIAGNOSIS — J069 Acute upper respiratory infection, unspecified: Secondary | ICD-10-CM

## 2013-09-17 MED ORDER — OLOPATADINE HCL 0.1 % OP SOLN: 1.0000 [drp] | Freq: Two times a day (BID) | OPHTHALMIC | Status: DC

## 2013-09-17 MED ORDER — DM-GUAIFENESIN ER 30-600 MG PO TB12: 1.0000 | ORAL_TABLET | Freq: Two times a day (BID) | ORAL | Status: DC

## 2013-09-17 MED ORDER — HYDROCODONE-HOMATROPINE 5-1.5 MG/5ML PO SYRP: 5.0000 mL | ORAL_SOLUTION | Freq: Three times a day (TID) | ORAL | Status: DC | PRN

## 2013-09-17 NOTE — Progress Notes (Signed)
Pre visit review using our clinic review tool, if applicable. No additional management support is needed unless otherwise documented below in the visit note. 

## 2013-09-17 NOTE — Patient Instructions (Signed)

## 2013-09-17 NOTE — Progress Notes (Signed)
URI sxs x 7 days Cough for 5 days Conjunctival erythema for 3 days  No fever or chills.    Review problems, allergies, medications.  Review of systems: Denies fever, chills. Denies shortness of breath.   Exam - vital signs reviewed. Chest her auscultation. Cardiac exam S1-S2 are regular. HEENT exam: Tympanic membranes are normal. There is conjunctival erythema. No exudate. No preauricular adenopathy.  Assessment and plan: URI. Symptomatic treatment. Will use Patanol eyedrops and Hycodan cough syrup. Side effects discussed.

## 2013-09-26 ENCOUNTER — Encounter: Payer: Self-pay | Admitting: Internal Medicine

## 2013-10-08 ENCOUNTER — Ambulatory Visit: Payer: Medicare Other | Admitting: Internal Medicine

## 2014-01-03 ENCOUNTER — Other Ambulatory Visit: Payer: Self-pay | Admitting: Family

## 2014-02-04 ENCOUNTER — Encounter: Payer: Self-pay | Admitting: Internal Medicine

## 2014-02-05 MED ORDER — ESCITALOPRAM OXALATE 20 MG PO TABS: ORAL_TABLET | ORAL | Status: DC

## 2014-02-05 MED ORDER — HYDROCHLOROTHIAZIDE 25 MG PO TABS: ORAL_TABLET | ORAL | Status: DC

## 2014-03-04 ENCOUNTER — Encounter: Payer: Self-pay | Admitting: Internal Medicine

## 2014-03-05 MED ORDER — SIMVASTATIN 20 MG PO TABS: 20.0000 mg | ORAL_TABLET | Freq: Every day | ORAL | Status: DC

## 2014-04-10 ENCOUNTER — Other Ambulatory Visit: Payer: Self-pay

## 2014-04-10 DIAGNOSIS — Z1231 Encounter for screening mammogram for malignant neoplasm of breast: Secondary | ICD-10-CM

## 2014-04-23 ENCOUNTER — Ambulatory Visit: Payer: Medicare Other

## 2014-05-01 ENCOUNTER — Ambulatory Visit
Admission: RE | Admit: 2014-05-01 | Discharge: 2014-05-01 | Disposition: A | Discharge status: Home / Self Care | Payer: Medicare Other | Source: Ambulatory Visit

## 2014-05-01 DIAGNOSIS — Z1231 Encounter for screening mammogram for malignant neoplasm of breast: Secondary | ICD-10-CM | POA: Diagnosis not present

## 2014-07-25 ENCOUNTER — Telehealth: Payer: Self-pay | Admitting: Internal Medicine

## 2014-07-25 MED ORDER — SIMVASTATIN 20 MG PO TABS: 20.0000 mg | ORAL_TABLET | Freq: Every day | ORAL | Status: DC

## 2014-07-25 MED ORDER — ESCITALOPRAM OXALATE 20 MG PO TABS: ORAL_TABLET | ORAL | Status: DC

## 2014-07-25 MED ORDER — HYDROCHLOROTHIAZIDE 25 MG PO TABS: ORAL_TABLET | ORAL | Status: DC

## 2014-07-25 NOTE — Telephone Encounter (Signed)
Medications refilled

## 2014-07-25 NOTE — Telephone Encounter (Addendum)
Pt needs refills on hctz,simvastatin and generic lexapro [facility] [address]. Pt has appt on 12-25-14 with hunter

## 2014-08-08 DIAGNOSIS — Z23 Encounter for immunization: Secondary | ICD-10-CM | POA: Diagnosis not present

## 2014-08-31 DIAGNOSIS — J209 Acute bronchitis, unspecified: Secondary | ICD-10-CM | POA: Diagnosis not present

## 2014-09-26 DIAGNOSIS — L821 Other seborrheic keratosis: Secondary | ICD-10-CM | POA: Diagnosis not present

## 2014-09-26 DIAGNOSIS — L300 Nummular dermatitis: Secondary | ICD-10-CM | POA: Diagnosis not present

## 2014-11-06 DIAGNOSIS — R05 Cough: Secondary | ICD-10-CM | POA: Diagnosis not present

## 2014-12-25 ENCOUNTER — Ambulatory Visit: Payer: Medicare Other | Admitting: Family Medicine

## 2014-12-27 ENCOUNTER — Ambulatory Visit (INDEPENDENT_AMBULATORY_CARE_PROVIDER_SITE_OTHER): Payer: Medicare Other | Admitting: Family Medicine

## 2014-12-27 VITALS — BP 128/88 | HR 96 | Temp 98.3°F | Wt 226.0 lb

## 2014-12-27 DIAGNOSIS — R739 Hyperglycemia, unspecified: Secondary | ICD-10-CM | POA: Diagnosis not present

## 2014-12-27 DIAGNOSIS — F329 Major depressive disorder, single episode, unspecified: Secondary | ICD-10-CM | POA: Diagnosis not present

## 2014-12-27 DIAGNOSIS — I10 Essential (primary) hypertension: Secondary | ICD-10-CM

## 2014-12-27 DIAGNOSIS — E785 Hyperlipidemia, unspecified: Secondary | ICD-10-CM | POA: Diagnosis not present

## 2014-12-27 DIAGNOSIS — F32A Depression, unspecified: Secondary | ICD-10-CM

## 2014-12-28 ENCOUNTER — Encounter: Payer: Self-pay | Admitting: Family Medicine

## 2014-12-28 DIAGNOSIS — M109 Gout, unspecified: Secondary | ICD-10-CM | POA: Insufficient documentation

## 2014-12-28 DIAGNOSIS — J309 Allergic rhinitis, unspecified: Secondary | ICD-10-CM | POA: Insufficient documentation

## 2014-12-28 MED ORDER — ESCITALOPRAM OXALATE 20 MG PO TABS: ORAL_TABLET | ORAL | Status: DC

## 2014-12-28 MED ORDER — SIMVASTATIN 20 MG PO TABS: 20.0000 mg | ORAL_TABLET | Freq: Every day | ORAL | Status: DC

## 2014-12-28 MED ORDER — HYDROCHLOROTHIAZIDE 25 MG PO TABS: ORAL_TABLET | ORAL | Status: DC

## 2014-12-28 NOTE — Assessment & Plan Note (Signed)
S: stable lexapro 20mg , xanax 0.25mg  4-5x a year. Recent stressors include caring for grandchildren almost full time, one recently with concussion, 1/2 sister very ill. Has not had to take extra xanax. Spending a few weeks with her sister who may be passing away.  A/P: reasonable control on lexapro 20mg , xanax 0.25mg  4-5x a year so will continue

## 2014-12-28 NOTE — Progress Notes (Signed)
Garret Reddish, MD Phone: [phone]  Subjective:  Patient presents today to establish care with me as their new primary care provider. Patient was formerly a patient of Dr. Leanne Chang. Chief complaint-noted.   See problem oriented charting ROS-no chest pain or shortness of breath. No myalgias No SI/HI. Denies any  HA, blurry vision, LE edema, transient weakness, orthopnea, PND.   The following were reviewed and entered/updated in epic: Past Medical History  Diagnosis Date  . Depression   . Hyperlipidemia   . Hypertension   . Anxiety    Patient Active Problem List   Diagnosis Date Noted  . Gout 12/28/2014    Priority: Medium  . Hyperlipidemia 09/30/2009    Priority: Medium  . Depression 05/22/2007    Priority: Medium  . Essential hypertension 05/22/2007    Priority: Medium  . Allergic rhinitis 12/28/2014    Priority: Low  . Hyperglycemia 08/31/2013    Priority: Low   Past Surgical History  Procedure Laterality Date  . None      Family History  Problem Relation Age of Onset  . Stroke Mother 68  . Heart disease Father 28    mi, smoker  . Lung cancer Sister     1/2 sister-lung cancer    Medications- reviewed and updated Current Outpatient Prescriptions  Medication Sig Dispense Refill  . ALPRAZolam (XANAX) 0.25 MG tablet 1 tab twice daily as needed 60 tablet 1  . escitalopram (LEXAPRO) 20 MG tablet take 1 tablet by mouth once daily 90 tablet 3  . fluticasone (FLONASE) 50 MCG/ACT nasal spray instill 2 sprays into each nostril once daily 16 g 3  . hydrochlorothiazide (HYDRODIURIL) 25 MG tablet take 1 tablet by mouth once daily 90 tablet 3  . indomethacin (INDOCIN) 25 MG capsule take 2 capsules by mouth three times a day for 48 hours then 1 capsule by mouth three times a day until pain RESOLVED. 45 capsule 0  . olopatadine (PATANOL) 0.1 % ophthalmic solution Place 1 drop into both eyes 2 (two) times daily. 5 mL 12  . simvastatin (ZOCOR) 20 MG tablet Take 1 tablet (20  mg total) by mouth daily at 6 PM. 90 tablet 3   No current facility-administered medications for this visit.    Allergies-reviewed and updated Allergies  Allergen Reactions  . Penicillins     REACTION: swelling,rash    History   Social History  . Marital Status: Married    Spouse Name: N/A  . Number of Children: N/A  . Years of Education: N/A   Social History Main Topics  . Smoking status: Never Smoker   . Smokeless tobacco: Not on file  . Alcohol Use: 0.0 - 0.6 oz/week    0-1 Glasses of wine per week     Comment: about 1 drink per month  . Drug Use: No  . Sexual Activity: No   Other Topics Concern  . None   Social History Narrative   Widowed 2008. 2 daughters-1 in [place] and 1 in [place]. 2 grandkids      Taught special education until daughters born   Mirian Mo      Hobbies: dancing, cooking and family gatherings.     ROS--See HPI   Objective: BP 128/88 mmHg  Pulse 96  Temp(Src) 98.3 F (36.8 C)  Wt 226 lb (102.513 kg) Gen: NAD, resting comfortably HEENT: Mucous membranes are moist. Oropharynx normal Neck: no thyromegaly CV: RRR no murmurs rubs or gallops Lungs: CTAB no crackles, wheeze, rhonchi Abdomen:  soft/nontender/nondistended/normal bowel sounds. No rebound or guarding.  Ext: no edema Skin: warm, dry Neuro: grossly normal, moves all extremities, PERRLA  Assessment/Plan:  Hyperlipidemia S:mild poor control last check. Appears started on simvastatin since that time.   A/P: Epic was down at time of visit. Last ldl >180 and appears started on statin. We will need to update bloodwork next visit to eval improvement.   Depression S: stable lexapro 20mg , xanax 0.25mg  4-5x a year. Recent stressors include caring for grandchildren almost full time, one recently with concussion, 1/2 sister very ill. Has not had to take extra xanax. Spending a few weeks with her sister who may be passing away.  A/P: reasonable control on lexapro 20mg , xanax 0.25mg  4-5x  a year so will continue  Essential hypertension S: stable on hctz 25mg  A/P: controlled on hctz. Continue. Patient reports a history of gout and we discussed switching medication away from hctz but she would like to research options before trying.   Hyperglycemia S: Last a1c 5.9. Patient aware risks her weight poses.  A/P: We discussed healthy lifestyle habits to help with weight loss.   F/u 6 months (patient states may wait year). Will need labs updated. Check in about 1/2 sister who may have passed by time of next visit  Added ASA 81mg  every other day given early MI in father even thoguh was smoker.   Please note epic was down at time of visit and could not review health maintenance.   Meds ordered this encounter  Medications  . escitalopram (LEXAPRO) 20 MG tablet    Sig: take 1 tablet by mouth once daily    Dispense:  90 tablet    Refill:  3  . hydrochlorothiazide (HYDRODIURIL) 25 MG tablet    Sig: take 1 tablet by mouth once daily    Dispense:  90 tablet    Refill:  3  . simvastatin (ZOCOR) 20 MG tablet    Sig: Take 1 tablet (20 mg total) by mouth daily at 6 PM.    Dispense:  90 tablet    Refill:  3

## 2014-12-28 NOTE — Assessment & Plan Note (Signed)
S: stable on hctz 25mg  A/P: controlled on hctz. Continue. Patient reports a history of gout and we discussed switching medication away from hctz but she would like to research options before trying.

## 2014-12-28 NOTE — Assessment & Plan Note (Signed)
S:mild poor control last check. Appears started on simvastatin since that time.   A/P: Epic was down at time of visit. Last ldl >180 and appears started on statin. We will need to update bloodwork next visit to eval improvement.

## 2014-12-28 NOTE — Assessment & Plan Note (Addendum)
S: Last a1c 5.9. Patient aware risks her weight poses.  A/P: We discussed healthy lifestyle habits to help with weight loss.

## 2014-12-30 ENCOUNTER — Other Ambulatory Visit: Payer: Self-pay | Admitting: Family

## 2015-04-09 DIAGNOSIS — R05 Cough: Secondary | ICD-10-CM | POA: Diagnosis not present

## 2015-04-09 DIAGNOSIS — J0100 Acute maxillary sinusitis, unspecified: Secondary | ICD-10-CM | POA: Diagnosis not present

## 2015-05-05 ENCOUNTER — Other Ambulatory Visit: Payer: Self-pay

## 2015-05-05 DIAGNOSIS — Z1231 Encounter for screening mammogram for malignant neoplasm of breast: Secondary | ICD-10-CM

## 2015-05-08 ENCOUNTER — Ambulatory Visit
Admission: RE | Admit: 2015-05-08 | Discharge: 2015-05-08 | Disposition: A | Discharge status: Home / Self Care | Payer: Medicare Other | Source: Ambulatory Visit

## 2015-05-08 DIAGNOSIS — Z1231 Encounter for screening mammogram for malignant neoplasm of breast: Secondary | ICD-10-CM | POA: Diagnosis not present

## 2015-05-16 ENCOUNTER — Encounter: Payer: Self-pay | Admitting: Podiatry

## 2015-05-16 ENCOUNTER — Ambulatory Visit (INDEPENDENT_AMBULATORY_CARE_PROVIDER_SITE_OTHER): Payer: Medicare Other | Admitting: Podiatry

## 2015-05-16 ENCOUNTER — Ambulatory Visit (INDEPENDENT_AMBULATORY_CARE_PROVIDER_SITE_OTHER): Payer: Medicare Other

## 2015-05-16 ENCOUNTER — Telehealth: Payer: Self-pay | Admitting: *Deleted

## 2015-05-16 VITALS — BP 133/81 | HR 99 | Resp 16 | Ht 62.0 in | Wt 218.0 lb

## 2015-05-16 DIAGNOSIS — M722 Plantar fascial fibromatosis: Secondary | ICD-10-CM

## 2015-05-16 MED ORDER — MELOXICAM 15 MG PO TABS: 15.0000 mg | ORAL_TABLET | Freq: Every day | ORAL | Status: DC

## 2015-05-16 MED ORDER — METHYLPREDNISOLONE 4 MG PO TBPK: ORAL_TABLET | ORAL | Status: DC

## 2015-05-16 NOTE — Progress Notes (Signed)
   Subjective:    Patient ID: Cynthia Berry, female    DOB: [birth year], 69 y.o.   MRN: [id number]  HPI Comments: "I think I have that fasciitis"  Patient c/o aching plantar heel and lateral side left for a few months. AM pain. Tried ice, OTC arch supports, stretching, Ibuprofen, wore a boot that a friend gave her-no help.     Review of Systems  Musculoskeletal: Positive for arthralgias.  All other systems reviewed and are negative.      Objective:   Physical Exam Physical Exam: I have reviewed her past history medications allergies surgery social history and review systems. Pulses are strongly palpable bilateral. Neurologic sensorium is intact for Semmes-Weinstein monofilament. Deep tendon reflexes are intact bilaterally muscle strength +5 over 5 dorsiflexors plantar flexors and inverters everters all Jones musculature is intact. Orthopedic evaluation demonstrates all joints distal to the ankle for range of motion without crepitation. Cutaneous evaluation demonstrates supple well-hydrated cutis. She has pain on palpation medial calcaneal tubercle of the left heel. Radiographs confirm soft tissue increase in density at the plantar fascial calcaneal insertion site.        Assessment & Plan:  Assessment: Plantar fasciitis left   Plan: Discussed etiology pathology conservative versus surgical therapies. Injected the heel today with Kenalog and local anesthetic. Dispensed a plantar fascial brace and a night splint. Discussed the appropriate shoe gear stretching exercises ice therapy shoe modifications. Dispensed a prescription for Medrol Dosepak to be followed by meloxicam. I will follow-up with her 1 month.  ** declined to take prescriptions stating that she is on statin and has fungus.

## 2015-05-16 NOTE — Patient Instructions (Signed)

## 2015-05-16 NOTE — Telephone Encounter (Addendum)
Pt states her medications were not at the [facility] [address] [phone].  I reviewed pt's medications the rxs were sent to [facility] in [place], [place].  I called the pharmacist and explained, I will switch the pharmacies.  Pt states she does not want to take the two medications Dr. Milinda Pointer ordered today, one states not to take with the statins and the other states don't take if you have a fungal infection, which she is affected by both.  I told pt that I would present to Dr. Milinda Pointer on Monday since he was out of the office and call her then.  She asked what to do til then and I recommended if she could tolerate OTC Ibuprofen or Aleve take as the package directs and ice 10-15 mins for 3-4 times a day.  Pt asked if she had arthritis in the x-rays; I told her Dr. Stephenie Acres dictation did not mention arthritis.  I informed pt of Dr. Stephenie Acres recommendations again as on 05/16/2015, and pt states she will begin the medications.  Pt did not understand she needed to take the steroid pack to completion then Meloxicam, I reiterated those instructions.  Pt states the strap she received on 05/16/2015 has broken, I told her to return it and we would replace.  Pt agreed.

## 2015-05-18 NOTE — Telephone Encounter (Signed)
I recommended these medications and if she wants to improve she should take them.  These medications are should only be used with caution with those other meds and conditions if used long term.  These are not hard contra indications.  Otherwise use otc ibuprofen which carries the same risks if not more because to get the same efficacy she will need to take much more of it.

## 2015-05-20 ENCOUNTER — Ambulatory Visit: Payer: Medicare Other | Admitting: Podiatry

## 2015-06-04 DIAGNOSIS — M25561 Pain in right knee: Secondary | ICD-10-CM | POA: Diagnosis not present

## 2015-06-04 DIAGNOSIS — M1711 Unilateral primary osteoarthritis, right knee: Secondary | ICD-10-CM | POA: Diagnosis not present

## 2015-06-11 ENCOUNTER — Telehealth: Payer: Self-pay | Admitting: *Deleted

## 2015-06-11 ENCOUNTER — Telehealth: Payer: Self-pay | Admitting: Family Medicine

## 2015-06-11 NOTE — Telephone Encounter (Signed)
Pt states the foot situation has gotten better, so she cancelled her appt with Dr. Milinda Pointer tomorrow, but has questions about Meloxicam.  I spoke with pt and she wanted to know if she needed to continue the Meloxicam and the 3 refills if she was better.  I told her to complete the Meloxicam she has on hand.  Pt asked if she should use Meloxicam on the out of the country trip, told her to get 1 refill and take with her in case of a flare up.  Pt states understanding.

## 2015-06-11 NOTE — Telephone Encounter (Signed)
Pt request refill of the following:  Indomethacin  25MG   Pt said the above med she takes for gout and will need it to take with her on her trip    Pt said she is going out of the country and is asking for a letter that states her meds are prescribed by the doctor.   Phamacy: [place]

## 2015-06-12 ENCOUNTER — Ambulatory Visit: Payer: Medicare Other | Admitting: Podiatry

## 2015-06-12 ENCOUNTER — Other Ambulatory Visit: Payer: Self-pay | Admitting: Family Medicine

## 2015-06-12 MED ORDER — INDOMETHACIN 25 MG PO CAPS: ORAL_CAPSULE | ORAL | Status: DC

## 2015-06-12 NOTE — Telephone Encounter (Signed)
I refilled the med. You can write her a letter for this.

## 2015-06-12 NOTE — Telephone Encounter (Signed)
Letter printed.

## 2015-06-17 ENCOUNTER — Telehealth: Payer: Self-pay | Admitting: Family Medicine

## 2015-06-17 NOTE — Telephone Encounter (Signed)
Patient wants to know about take a cortisone shot. Patient states that she has seen two different doctors for two different things and she wants to know if it is ok to get another cortisone shot because of the spacing of the shots. Patients wants to know if it would be ok to wear compression socks for a long flight. Patient has appointment tomorrow for the cortisone shot. Patient is leaving to go out of town at the end of the week. Patient would like a call to know if this is ok.

## 2015-06-17 NOTE — Telephone Encounter (Signed)
Ok to wear compression stockings in flight. Pumping her calves (flexing muscles) can also help prevent blood clot risks  Generally best to wait at least 3 months between cortisone injections. Ok to get more than 1 in 3 months if in different locations if provider giving injection is ok with it. Main concern is increasing blood sugar

## 2015-06-17 NOTE — Telephone Encounter (Signed)
See below

## 2015-06-18 DIAGNOSIS — M1711 Unilateral primary osteoarthritis, right knee: Secondary | ICD-10-CM | POA: Diagnosis not present

## 2015-06-18 NOTE — Telephone Encounter (Signed)
Pt returned call and notified of below.

## 2015-06-18 NOTE — Telephone Encounter (Signed)
Called and lm on pt vm tcb. 

## 2015-08-06 DIAGNOSIS — Z23 Encounter for immunization: Secondary | ICD-10-CM | POA: Diagnosis not present

## 2015-10-28 ENCOUNTER — Other Ambulatory Visit: Payer: Self-pay | Admitting: Family Medicine

## 2015-10-28 NOTE — Telephone Encounter (Signed)
Yes thanks 

## 2015-10-28 NOTE — Telephone Encounter (Signed)
Refill ok? 

## 2016-02-03 ENCOUNTER — Other Ambulatory Visit: Payer: Self-pay | Admitting: *Deleted

## 2016-02-03 ENCOUNTER — Encounter: Payer: Self-pay | Admitting: Family Medicine

## 2016-02-03 MED ORDER — ESCITALOPRAM OXALATE 20 MG PO TABS: ORAL_TABLET | ORAL | Status: DC

## 2016-02-03 MED ORDER — SIMVASTATIN 20 MG PO TABS: 20.0000 mg | ORAL_TABLET | Freq: Every day | ORAL | Status: DC

## 2016-02-03 MED ORDER — HYDROCHLOROTHIAZIDE 25 MG PO TABS: ORAL_TABLET | ORAL | Status: DC

## 2016-02-04 DIAGNOSIS — Z23 Encounter for immunization: Secondary | ICD-10-CM | POA: Diagnosis not present

## 2016-02-11 ENCOUNTER — Other Ambulatory Visit (INDEPENDENT_AMBULATORY_CARE_PROVIDER_SITE_OTHER): Payer: Medicare Other

## 2016-02-11 DIAGNOSIS — Z0000 Encounter for general adult medical examination without abnormal findings: Secondary | ICD-10-CM

## 2016-02-11 LAB — TSH: TSH: 2.57 u[IU]/mL (ref 0.35–4.50)

## 2016-02-11 LAB — BASIC METABOLIC PANEL
BUN: 13 mg/dL (ref 6–23)
CALCIUM: 9.7 mg/dL (ref 8.4–10.5)
CO2: 28 meq/L (ref 19–32)
CREATININE: 0.7 mg/dL (ref 0.40–1.20)
Chloride: 101 mEq/L (ref 96–112)
GFR: 88.15 mL/min (ref >60.00)
GLUCOSE: 142 mg/dL — AB (ref 70–99)
Potassium: 3.5 mEq/L (ref 3.5–5.1)
Sodium: 138 mEq/L (ref 135–145)

## 2016-02-11 LAB — POC URINALSYSI DIPSTICK (AUTOMATED)
GLUCOSE UA: NEGATIVE
Ketones, UA: NEGATIVE
Leukocytes, UA: NEGATIVE
NITRITE UA: NEGATIVE
RBC UA: NEGATIVE
SPEC GRAV UA: 1.02
UROBILINOGEN UA: 1
pH, UA: 6

## 2016-02-11 LAB — CBC WITH DIFFERENTIAL/PLATELET
Basophils Absolute: 0 10*3/uL (ref 0.0–0.1)
Basophils Relative: 0.3 % (ref 0.0–3.0)
EOS ABS: 0.1 10*3/uL (ref 0.0–0.7)
Eosinophils Relative: 1.2 % (ref 0.0–5.0)
HEMATOCRIT: 39.7 % (ref 36.0–46.0)
HEMOGLOBIN: 13.6 g/dL (ref 12.0–15.0)
LYMPHS ABS: 2.8 10*3/uL (ref 0.7–4.0)
Lymphocytes Relative: 26.2 % (ref 12.0–46.0)
MCHC: 34.3 g/dL (ref 30.0–36.0)
MCV: 84.6 fl (ref 78.0–100.0)
Monocytes Absolute: 0.9 10*3/uL (ref 0.1–1.0)
Monocytes Relative: 8.6 % (ref 3.0–12.0)
NEUTROS ABS: 6.7 10*3/uL (ref 1.4–7.7)
Neutrophils Relative %: 63.7 % (ref 43.0–77.0)
Platelets: 325 10*3/uL (ref 150.0–400.0)
RBC: 4.69 Mil/uL (ref 3.87–5.11)
RDW: 12.7 % (ref 11.5–15.5)
WBC: 10.5 10*3/uL (ref 4.0–10.5)

## 2016-02-11 LAB — LIPID PANEL
CHOL/HDL RATIO: 3
Cholesterol: 181 mg/dL (ref 0–200)
HDL: 52.4 mg/dL (ref >39.00)
LDL Cholesterol: 102 mg/dL — ABNORMAL HIGH (ref 0–99)
NONHDL: 128.89
Triglycerides: 135 mg/dL (ref 0.0–149.0)
VLDL: 27 mg/dL (ref 0.0–40.0)

## 2016-02-11 LAB — HEPATIC FUNCTION PANEL
ALBUMIN: 4 g/dL (ref 3.5–5.2)
ALT: 15 U/L (ref 0–35)
AST: 18 U/L (ref 0–37)
Alkaline Phosphatase: 61 U/L (ref 39–117)
BILIRUBIN DIRECT: 0.1 mg/dL (ref 0.0–0.3)
TOTAL PROTEIN: 7.7 g/dL (ref 6.0–8.3)
Total Bilirubin: 0.7 mg/dL (ref 0.2–1.2)

## 2016-02-12 ENCOUNTER — Other Ambulatory Visit: Payer: Self-pay | Admitting: Family Medicine

## 2016-02-12 DIAGNOSIS — R739 Hyperglycemia, unspecified: Secondary | ICD-10-CM

## 2016-02-19 ENCOUNTER — Encounter: Payer: Self-pay | Admitting: Family Medicine

## 2016-02-19 ENCOUNTER — Other Ambulatory Visit: Payer: Self-pay | Admitting: Family Medicine

## 2016-02-19 ENCOUNTER — Ambulatory Visit (INDEPENDENT_AMBULATORY_CARE_PROVIDER_SITE_OTHER): Payer: Medicare Other | Admitting: Family Medicine

## 2016-02-19 VITALS — BP 118/82 | HR 96 | Temp 98.9°F | Resp 14 | Ht 61.0 in | Wt 219.0 lb

## 2016-02-19 DIAGNOSIS — E785 Hyperlipidemia, unspecified: Secondary | ICD-10-CM | POA: Diagnosis not present

## 2016-02-19 DIAGNOSIS — M109 Gout, unspecified: Secondary | ICD-10-CM

## 2016-02-19 DIAGNOSIS — R739 Hyperglycemia, unspecified: Secondary | ICD-10-CM

## 2016-02-19 DIAGNOSIS — F329 Major depressive disorder, single episode, unspecified: Secondary | ICD-10-CM

## 2016-02-19 DIAGNOSIS — Z1211 Encounter for screening for malignant neoplasm of colon: Secondary | ICD-10-CM | POA: Diagnosis not present

## 2016-02-19 DIAGNOSIS — Z780 Asymptomatic menopausal state: Secondary | ICD-10-CM | POA: Diagnosis not present

## 2016-02-19 DIAGNOSIS — F32A Depression, unspecified: Secondary | ICD-10-CM

## 2016-02-19 DIAGNOSIS — I10 Essential (primary) hypertension: Secondary | ICD-10-CM

## 2016-02-19 LAB — POCT GLYCOSYLATED HEMOGLOBIN (HGB A1C): HEMOGLOBIN A1C: 5.7

## 2016-02-19 MED ORDER — TETANUS-DIPHTH-ACELL PERTUSSIS 5-2.5-18.5 LF-MCG/0.5 IM SUSP: 0.5000 mL | Freq: Once | INTRAMUSCULAR | Status: DC

## 2016-02-19 NOTE — Assessment & Plan Note (Signed)
S: fasting sugar suggested DM over 140. Patient down 7 lbs from a year ago and a1c improved form 5.9 in 2014 to 2017. Patient was very anxious about this diagnosis driving HR and BP up early in visit. Required extensive counseling on where she is at- at risk and not diabetes and comforting her over her concerns A/P: counseling provided in regards to healthy lifestyle to help avoid progression to diabetes

## 2016-02-19 NOTE — Patient Instructions (Addendum)
I know you dont have diabetes but following a diabetic type diet can help with prevention:  You may find this book helpful for your diabetes. It is available on [place] and is fairly inexpensive.Dr. Janene Harvey Program for Reversing Diabetes: The Scientifically Proven System for Reversing Diabetes without Drugs. http://www.nealbarnard.org/index.cfm  Continue weight loss efforts. I would like to see you in 6 months to check in. Especially given increased risk of diabetes close follow up is important  Someone will call you to schedule your colonoscopy  Please stop by front desk and schedule bone density.  No changes in medication  Update your tetanus shot at your pharmacy  I would also like for you to sign up for an annual wellness visit on a Friday with our nurse Manuela Schwartz. This is a free benefit under medicare that may help [place] find additional ways to help you.

## 2016-02-19 NOTE — Assessment & Plan Note (Signed)
S: slight poor control on simvastatin 20mg  but this is down from LDL >180 so drastic improvement. No myalgias.  Lab Results  Component Value Date   CHOL 181 02/11/2016   HDL 52.40 02/11/2016   LDLCALC 102* 02/11/2016   LDLDIRECT 184.4 08/29/2013   TRIG 135.0 02/11/2016   CHOLHDL 3 02/11/2016   A/P: improved control, continue current prescription

## 2016-02-19 NOTE — Assessment & Plan Note (Signed)
S: stable on lexapro 20mg  and sparing xanax initially reported. Previously had been 4-5x a year. phq2 of 0 A/P: at end of visit patient asked nursing staff for refill of xanax but she had been given #60 in January with 3 refills- nursing staff asked patient to schedule a follow up to focus further on anxiety

## 2016-02-19 NOTE — Progress Notes (Signed)
Phone: [phone]  Subjective:  Patient presents today for their annual physical. Chief complaint-noted.   See problem oriented charting- ROS- full  review of systems was completed and negative including No chest pain or shortness of breath. No headache or blurry vision.   The following were reviewed and entered/updated in epic: Past Medical History  Diagnosis Date  . Depression   . Hyperlipidemia   . Hypertension   . Anxiety    Patient Active Problem List   Diagnosis Date Noted  . Gout 12/28/2014    Priority: Medium  . Hyperlipidemia 09/30/2009    Priority: Medium  . Depression 05/22/2007    Priority: Medium  . Essential hypertension 05/22/2007    Priority: Medium  . Allergic rhinitis 12/28/2014    Priority: Low  . Hyperglycemia 08/31/2013    Priority: Low   Past Surgical History  Procedure Laterality Date  . None      Family History  Problem Relation Age of Onset  . Stroke Mother 4789  . Heart disease Father 4451    mi, smoker  . Lung cancer Sister     1/2 sister-lung cancer    Medications- reviewed and updated Current Outpatient Prescriptions  Medication Sig Dispense Refill  . ALPRAZolam (XANAX) 0.25 MG tablet take 1 tablet by mouth twice a day if needed 60 tablet 3  . aspirin 81 MG tablet Take 81 mg by mouth daily.    Marland Kitchen. escitalopram (LEXAPRO) 20 MG tablet take 1 tablet by mouth once daily 30 tablet 0  . fluticasone (FLONASE) 50 MCG/ACT nasal spray instill 2 sprays into each nostril once daily 16 g 3  . hydrochlorothiazide (HYDRODIURIL) 25 MG tablet take 1 tablet by mouth once daily 30 tablet 0  . simvastatin (ZOCOR) 20 MG tablet Take 1 tablet (20 mg total) by mouth daily at 6 PM. 30 tablet 0  . Tdap (BOOSTRIX) 5-2.5-18.5 LF-MCG/0.5 injection Inject 0.5 mLs into the muscle once. 0.5 mL 0   No current facility-administered medications for this visit.    Allergies-reviewed and updated Allergies  Allergen Reactions  . Penicillins     REACTION:  swelling,rash  . Sulfa Antibiotics     Social History   Social History  . Marital Status: Married    Spouse Name: N/A  . Number of Children: N/A  . Years of Education: N/A   Social History Main Topics  . Smoking status: Never Smoker   . Smokeless tobacco: None  . Alcohol Use: 0.0 - 0.6 oz/week    0-1 Glasses of wine per week     Comment: about 1 drink per month  . Drug Use: No  . Sexual Activity: No   Other Topics Concern  . None   Social History Narrative   Widowed 2008. 2 daughters-1 in [place] and 1 in [place]. 2 grandkids      Taught special education until daughters born   Rande LawmanWen tto [employer]      Hobbies: dancing, cooking and family gatherings.     Objective: BP 118/82 mmHg  Pulse 96  Temp(Src) 98.9 F (37.2 C) (Oral)  Resp 14  Ht 5\' 1"  (1.549 m)  Wt 219 lb (99.338 kg)  BMI 41.40 kg/m2  SpO2 97% Gen: NAD, resting comfortably HEENT: Mucous membranes are moist. Oropharynx normal Neck: no thyromegaly CV: RRR no murmurs rubs or gallops Lungs: CTAB no crackles, wheeze, rhonchi Abdomen: soft/nontender/nondistended/normal bowel sounds. No rebound or guarding.  Ext: no edema Skin: warm, dry Neuro: grossly normal, moves all extremities, PERRLA  Assessment/Plan:  70 y.o. female presenting for regular physical and requests typical discussions/counseling done within annual exam  Health Maintenance counseling: 1. Anticipatory guidance: Patient counseled regarding regular dental exams, eye exams, wearing seatbelts.  2. Risk factor reduction:  Advised patient of need for regular exercise and diet rich and fruits and vegetables to reduce risk of heart attack and stroke.  Currently walkin 3 days a week- working up to 5 days a week. Down 7 lbs. Giving diabetic diet.   3. Immunizations/screenings/ancillary studies Immunization History  Administered Date(s) Administered  . Influenza Whole 08/13/2009  . Influenza-Unspecified 07/11/2013, 08/06/2015  . Pneumococcal Conjugate-13  12/12/2014  . Pneumococcal Polysaccharide-23 02/04/2016  . Td 08/11/2000   Health Maintenance Due  Topic Date Due  . Hepatitis C Screening - next labs June 27, 1946  . COLONOSCOPY  12/25/1996  . ZOSTAVAX - recommended as had in 7s and in eye distribution 12/26/2006  . TETANUS/TDAP - print for patient 08/11/2010  . DEXA SCAN  12/26/2011   4. Cervical cancer screening- aged out, never had abnormal pap smear 5. Breast cancer screening-  mammogram 05/08/2015, declines exam today 6. Colon cancer screening - refer today  Status of chronic or acute concerns   Hyperlipidemia S: slight poor control on simvastatin 20mg  but this is down from LDL >180 so drastic improvement. No myalgias.  Lab Results  Component Value Date   CHOL 181 02/11/2016   HDL 52.40 02/11/2016   LDLCALC 102* 02/11/2016   LDLDIRECT 184.4 08/29/2013   TRIG 135.0 02/11/2016   CHOLHDL 3 02/11/2016   A/P: improved control, continue current prescription    Depression S: stable on lexapro 20mg  and sparing xanax initially reported. Previously had been 4-5x a year. phq2 of 0 A/P: at end of visit patient asked nursing staff for refill of xanax but she had been given #60 in January with 3 refills- nursing staff asked patient to schedule a follow up to focus further on anxiety  Essential hypertension S: controlled. - typically controlled on hctz 25mg , patient admits to high anxiety today due to concern of diabetes and initial BP 158/98, improved on repeat BP Readings from Last 3 Encounters:  02/19/16 118/82  05/16/15 133/81  12/28/14 128/88  A/P:Continue current meds:  Doing well and no gout   Gout S: over a year since last gout attack. We had previously discussed stopping hctz A/P: considering no recurrence we opted out of stopping hctz. Consider if recurrence in the future. could also update uric acid   Hyperglycemia S: fasting sugar suggested DM over 140. Patient down 7 lbs from a year ago and a1c improved form 5.9 in  2014 to 2017. Patient was very anxious about this diagnosis driving HR and BP up early in visit. Required extensive counseling on where she is at- at risk and not diabetes and comforting her over her concerns A/P: counseling provided in regards to healthy lifestyle to help avoid progression to diabetes     Return in about 6 months (around 08/21/2016) for follow up- or sooner if needed. Return precautions advised.   Orders Placed This Encounter  Procedures  . DG Bone Density    Standing Status: Future     Number of Occurrences:      Standing Expiration Date: 04/20/2017    Order Specific Question:  Reason for Exam (SYMPTOM  OR DIAGNOSIS REQUIRED)    Answer:  post menopausal    Order Specific Question:  Preferred imaging location?    Answer:  [facility]  . Ambulatory  referral to Gastroenterology    Referral Priority:  Routine    Referral Type:  Consultation    Referral Reason:  Specialty Services Required    Number of Visits Requested:  1  . POC HgB A1c    Meds ordered this encounter  Medications  . Tdap (BOOSTRIX) 5-2.5-18.5 LF-MCG/0.5 injection    Sig: Inject 0.5 mLs into the muscle once.    Dispense:  0.5 mL    Refill:  0   The duration of face-to-face time during this visit was 45 minutes. Greater than 50% of this time was spent in counseling, explanation of diagnosis, planning of further management, and/or coordination of care.     Garret Reddish, MD

## 2016-02-19 NOTE — Assessment & Plan Note (Signed)
S: controlled. - typically controlled on hctz 25mg , patient admits to high anxiety today due to concern of diabetes and initial BP 158/98, improved on repeat BP Readings from Last 3 Encounters:  02/19/16 118/82  05/16/15 133/81  12/28/14 128/88  A/P:Continue current meds:  Doing well and no gout

## 2016-02-19 NOTE — Assessment & Plan Note (Signed)
S: over a year since last gout attack. We had previously discussed stopping hctz A/P: considering no recurrence we opted out of stopping hctz. Consider if recurrence in the future. could also update uric acid

## 2016-02-20 ENCOUNTER — Encounter: Payer: Self-pay | Admitting: Internal Medicine

## 2016-02-20 MED ORDER — ESCITALOPRAM OXALATE 20 MG PO TABS: ORAL_TABLET | ORAL | Status: DC

## 2016-02-20 MED ORDER — SIMVASTATIN 20 MG PO TABS: 20.0000 mg | ORAL_TABLET | Freq: Every day | ORAL | Status: DC

## 2016-02-20 MED ORDER — HYDROCHLOROTHIAZIDE 25 MG PO TABS: ORAL_TABLET | ORAL | Status: DC

## 2016-02-25 ENCOUNTER — Other Ambulatory Visit: Payer: Medicare Other

## 2016-02-27 ENCOUNTER — Encounter: Payer: BLUE CROSS/BLUE SHIELD | Admitting: Family Medicine

## 2016-03-01 ENCOUNTER — Other Ambulatory Visit: Payer: Self-pay

## 2016-03-01 MED ORDER — SIMVASTATIN 20 MG PO TABS: 20.0000 mg | ORAL_TABLET | Freq: Every day | ORAL | Status: DC

## 2016-04-06 ENCOUNTER — Ambulatory Visit (AMBULATORY_SURGERY_CENTER): Payer: Self-pay

## 2016-04-06 VITALS — Ht 61.0 in | Wt 221.4 lb

## 2016-04-06 DIAGNOSIS — Z1211 Encounter for screening for malignant neoplasm of colon: Secondary | ICD-10-CM

## 2016-04-06 NOTE — Progress Notes (Signed)
Per pt, no allergies to soy or egg products.Pt not taking any weight loss meds or using  O2 at home. 

## 2016-04-20 ENCOUNTER — Encounter: Payer: Medicare Other | Admitting: Gastroenterology

## 2016-04-21 ENCOUNTER — Telehealth: Payer: Self-pay | Admitting: Internal Medicine

## 2016-04-21 ENCOUNTER — Telehealth: Payer: Self-pay | Admitting: Family Medicine

## 2016-04-21 NOTE — Telephone Encounter (Signed)
[facility] - Client  [place]    --------------------------------------------------------------------------------   Patient Name: Cynthia  Berry   Gender: Female  DOB: May 15, 1946   Age: 70 Y 3 M 25 D  Return Phone Number: [phone] (Primary)  Address:     City/State/Zip:  [place]     Client [facility] - Client  Client Site [facility] - Day  Physician Garret Reddish - MD  Contact Type Call  Who Is Calling Patient / Member / Family / Caregiver  Call Type Triage / Clinical  Relationship To Patient Self  Return Phone Number [phone] (Primary)  Chief Complaint Medical Device, Procedure and Surgery Questions (non symptomatic)  Reason for Call Symptomatic / Request for [place] states is needing information about the colon guard, scheduled for a colonoscopy.   Appointment Disposition EMR Caller Not Reached  Translation No       Nurse Assessment       Guidelines          Guideline Title Affirmed Question Affirmed Notes Nurse Date/Time (Eastern Time)       Disp. Time Eilene Ghazi Time) Disposition Final User    04/21/2016 4:33:03 PM Attempt made - message left   Amalia Hailey, RN, Melissa    04/21/2016 4:53:12 PM Attempt made - no message left   Amalia Hailey, RN, Lenna Sciara      04/21/2016 5:06:31 PM FINAL ATTEMPT MADE - no message left Yes Amalia Hailey, RN, Melissa                  --------------------------------------------------------------------------------

## 2016-04-21 NOTE — Telephone Encounter (Signed)
I reviewed with the patient alternatives to colonoscopy.  I advised her on cologuard and Virtual colonoscopy.  We did discuss Capsule colon, but advised her that would need to go to a tertiary center to have that and not sure who has the technology at this point.  She is very nervous about the anesthesia.  She will thing about it until the end of the day.  She will either keep procedure for Monday or call back to cancel procedure.  She will discuss alternatives with her primary care if she decides not to keep appt.

## 2016-04-22 ENCOUNTER — Telehealth: Payer: Self-pay | Admitting: Family Medicine

## 2016-04-22 NOTE — Telephone Encounter (Signed)
Left voicemail message for patient asking for return phone call.

## 2016-04-22 NOTE — Telephone Encounter (Signed)
I spoke with pt and she is scheduled to have a colonoscopy on Monday 04/26/2016. She wants to know if the cologaurd would be sufficient or does Dr. Yong Channel recommend the colonoscopy?

## 2016-04-22 NOTE — Telephone Encounter (Signed)
PLEASE NOTE: All timestamps contained within this report are represented as [place] Standard Time. CONFIDENTIALTY NOTICE: This fax transmission is intended only for the addressee. It contains information that is legally privileged, confidential or otherwise protected from use or disclosure. If you are not the intended recipient, you are strictly prohibited from reviewing, disclosing, copying using or disseminating any of this information or taking any action in reliance on or regarding this information. If you have received this fax in error, please notify us immediately by telephone so that we can arrange for its return to [place]. Phone: [phone], Toll-Free: [phone], Fax: [phone] Page: 1 of 1 Call Id: [id number] [facility] - Client [place] Patient Name: Cynthia Berry Gender: Female DOB: [birth year] Age: 70 Y 55 M 25 D Return Phone Number: [phone] (Primary) Address: City/State/Zip: [place] Client [facility] - Client Client Site [facility] - Day Physician Garret Reddish - MD Contact Type Call Who Is Calling Patient / Member / Family / Caregiver Call Type Triage / Clinical Relationship To Patient Self Return Phone Number [phone] (Primary) Chief Complaint Medical Device, Procedure and Surgery Questions (non symptomatic) Reason for Call Symptomatic / Request for Health Information Initial Comment Caller states is needing information about the colon guard, scheduled for a colonoscopy. Appointment Disposition EMR Caller Not Reached Info pasted into Epic Yes Translation No Nurse Assessment Guidelines Guideline Title Affirmed Question Affirmed Notes Nurse Date/Time (Eastern Time) Disp. Time Eilene Ghazi Time) Disposition Final User 04/21/2016 4:33:03 PM Attempt made - message left Amalia Hailey, RN, Melissa 04/21/2016 4:53:12 PM Attempt made - no message left Amalia Hailey, RN,  Lenna Sciara 04/21/2016 5:06:31 PM FINAL ATTEMPT MADE - no message left Yes Amalia Hailey, RN, Lenna Sciara

## 2016-04-22 NOTE — Telephone Encounter (Signed)
My preference is colonoscopy if patient is willing as they can remove any polyps if noted so it is diagnostic as well as treatment all in one. Cologuard may lead to need for colonoscopy

## 2016-04-23 NOTE — Telephone Encounter (Signed)
Spoke with patient who verbalized understanding of colonoscopy preference vs ColorGuard. Patient did state she was nervous. Reassured her that this was normal. I encouraged her to make sure that she communicated any questions or concerns with her physicians. She assured me she would.

## 2016-04-26 ENCOUNTER — Encounter: Payer: Self-pay | Admitting: Internal Medicine

## 2016-04-26 ENCOUNTER — Ambulatory Visit (AMBULATORY_SURGERY_CENTER): Payer: Medicare Other | Admitting: Internal Medicine

## 2016-04-26 VITALS — BP 108/67 | HR 89 | Temp 98.4°F | Resp 19 | Ht 61.0 in | Wt 221.0 lb

## 2016-04-26 DIAGNOSIS — Z1211 Encounter for screening for malignant neoplasm of colon: Secondary | ICD-10-CM

## 2016-04-26 DIAGNOSIS — D120 Benign neoplasm of cecum: Secondary | ICD-10-CM

## 2016-04-26 MED ORDER — SODIUM CHLORIDE 0.9 % IV SOLN: 500.0000 mL | INTRAVENOUS | Status: DC

## 2016-04-26 NOTE — Patient Instructions (Addendum)
I removed one tiny polyp from the colon. No signs of cancer. You also have a condition called diverticulosis - common and not usually a problem. Please read the handout provided.  I will let you know pathology results and when to have another routine colonoscopy by mail.  I appreciate the opportunity to care for you. Gatha Mayer, MD, [facility]   Handouts given on polyps,diverticulosis.   YOU HAD AN ENDOSCOPIC PROCEDURE TODAY AT [facility]:   Refer to the procedure report that was given to you for any specific questions about what was found during the examination.  If the procedure report does not answer your questions, please call your gastroenterologist to clarify.  If you requested that your care partner not be given the details of your procedure findings, then the procedure report has been included in a sealed envelope for you to review at your convenience later.  YOU SHOULD EXPECT: Some feelings of bloating in the abdomen. Passage of more gas than usual.  Walking can help get rid of the air that was put into your GI tract during the procedure and reduce the bloating. If you had a lower endoscopy (such as a colonoscopy or flexible sigmoidoscopy) you may notice spotting of blood in your stool or on the toilet paper. If you underwent a bowel prep for your procedure, you may not have a normal bowel movement for a few days.  Please Note:  You might notice some irritation and congestion in your nose or some drainage.  This is from the oxygen used during your procedure.  There is no need for concern and it should clear up in a day or so.  SYMPTOMS TO REPORT IMMEDIATELY:   Following lower endoscopy (colonoscopy or flexible sigmoidoscopy):  Excessive amounts of blood in the stool  Significant tenderness or worsening of abdominal pains  Swelling of the abdomen that is new, acute  Fever of 100F or higher   For urgent or emergent issues, a gastroenterologist can be  reached at any hour by calling [phone].   DIET: Your first meal following the procedure should be a small meal and then it is ok to progress to your normal diet. Heavy or fried foods are harder to digest and may make you feel nauseous or bloated.  Likewise, meals heavy in dairy and vegetables can increase bloating.  Drink plenty of fluids but you should avoid alcoholic beverages for 24 hours.  ACTIVITY:  You should plan to take it easy for the rest of today and you should NOT DRIVE or use heavy machinery until tomorrow (because of the sedation medicines used during the test).    FOLLOW UP: Our staff will call the number listed on your records the next business day following your procedure to check on you and address any questions or concerns that you may have regarding the information given to you following your procedure. If we do not reach you, we will leave a message.  However, if you are feeling well and you are not experiencing any problems, there is no need to return our call.  We will assume that you have returned to your regular daily activities without incident.  If any biopsies were taken you will be contacted by phone or by letter within the next 1-3 weeks.  Please call us at [phone] if you have not heard about the biopsies in 3 weeks.    SIGNATURES/CONFIDENTIALITY: You and/or your care partner have signed paperwork which will  be entered into your electronic medical record.  These signatures attest to the fact that that the information above on your After Visit Summary has been reviewed and is understood.  Full responsibility of the confidentiality of this discharge information lies with you and/or your care-partner. 

## 2016-04-26 NOTE — Progress Notes (Signed)
Report to PACU, RN, vss, BBS= Clear.  

## 2016-04-26 NOTE — Progress Notes (Signed)
Called to room to assist during endoscopic procedure.  Patient ID and intended procedure confirmed with present staff. Received instructions for my participation in the procedure from the performing physician.  

## 2016-04-26 NOTE — Op Note (Signed)
[facility] Patient Name: Cynthia Berry Procedure Date: 04/26/2016 10:46 AM MRN: [id number] Endoscopist: Gatha Mayer , MD Age: 70 Referring MD:  Date of Birth: 06/24/1946 Gender: Female Account #: [id number] Procedure:                Colonoscopy Indications:              Screening for colorectal malignant neoplasm, This                            is the patient's first colonoscopy Medicines:                Propofol per Anesthesia, Monitored Anesthesia Care Procedure:                Pre-Anesthesia Assessment:                           - Prior to the procedure, a History and Physical                            was performed, and patient medications and                            allergies were reviewed. The patient's tolerance of                            previous anesthesia was also reviewed. The risks                            and benefits of the procedure and the sedation                            options and risks were discussed with the patient.                            All questions were answered, and informed consent                            was obtained. Prior Anticoagulants: The patient                            last took aspirin 5 days prior to the procedure.                            ASA Grade Assessment: II - A patient with mild                            systemic disease. After reviewing the risks and                            benefits, the patient was deemed in satisfactory                            condition to undergo the procedure.  After obtaining informed consent, the colonoscope                            was passed under direct vision. Throughout the                            procedure, the patient's blood pressure, pulse, and                            oxygen saturations were monitored continuously. The                            Model CF-HQ190L [id number]) scope was introduced                            through the  anus and advanced to the the cecum,                            identified by appendiceal orifice and ileocecal                            valve. The colonoscopy was performed without                            difficulty. The patient tolerated the procedure                            well. The quality of the bowel preparation was                            good. The bowel preparation used was Miralax. The                            ileocecal valve, appendiceal orifice, and rectum                            were photographed. Scope In: 10:56:34 AM Scope Out: 11:07:43 AM Scope Withdrawal Time: 0 hours 8 minutes 15 seconds  Total Procedure Duration: 0 hours 11 minutes 9 seconds  Findings:                 The perianal and digital rectal examinations were                            normal.                           A 2 mm polyp was found in the cecum. The polyp was                            sessile. The polyp was removed with a cold biopsy                            forceps. Resection and retrieval were complete.  Verification of patient identification for the                            specimen was done. Estimated blood loss was minimal.                           Multiple large-mouthed diverticula were found in                            the sigmoid colon and descending colon. There was                            no evidence of diverticular bleeding.                           The exam was otherwise without abnormality on                            direct and retroflexion views. Complications:            No immediate complications. Estimated Blood Loss:     Estimated blood loss was minimal. Impression:               - One 2 mm polyp in the cecum, removed with a cold                            biopsy forceps. Resected and retrieved.                           - Moderate diverticulosis in the sigmoid colon and                            in the descending colon. There  was no evidence of                            diverticular bleeding.                           - The examination was otherwise normal on direct                            and retroflexion views. Recommendation:           - Patient has a contact number available for                            emergencies. The signs and symptoms of potential                            delayed complications were discussed with the                            patient. Return to normal activities tomorrow.  Written discharge instructions were provided to the                            patient.                           - Resume previous diet.                           - Continue present medications.                           - Repeat colonoscopy is recommended. The                            colonoscopy date will be determined after pathology                            results from today's exam become available for                            review. Gatha Mayer, MD 04/26/2016 11:13:52 AM This report has been signed electronically.

## 2016-04-26 NOTE — Progress Notes (Signed)
Pt is very anxious.  She had a lot of questions about the colonoscopy.  I answered them all and assured her we would take very good care of her. maw

## 2016-04-27 ENCOUNTER — Telehealth: Payer: Self-pay

## 2016-04-27 NOTE — Telephone Encounter (Signed)
  Follow up Call-  Call back number 04/26/2016  Post procedure Call Back phone  # [phone] hm  Permission to leave phone message Yes     Patient questions:  Do you have a fever, pain , or abdominal swelling? No. Pain Score  0 *  Have you tolerated food without any problems? Yes.    Have you been able to return to your normal activities? Yes.    Do you have any questions about your discharge instructions: Diet   No. Medications  No. Follow up visit  No.  Do you have questions or concerns about your Care? No.  Actions: * If pain score is 4 or above: No action needed, pain <4.

## 2016-04-30 ENCOUNTER — Encounter: Payer: Self-pay | Admitting: Internal Medicine

## 2016-04-30 DIAGNOSIS — Z860101 Personal history of adenomatous and serrated colon polyps: Secondary | ICD-10-CM

## 2016-04-30 DIAGNOSIS — Z86010 Personal history of colonic polyps: Secondary | ICD-10-CM

## 2016-04-30 HISTORY — DX: Personal history of colonic polyps: Z86.010

## 2016-04-30 HISTORY — DX: Personal history of adenomatous and serrated colon polyps: Z86.0101

## 2016-04-30 NOTE — Progress Notes (Signed)
Quick Note:  2 mm adenoma Recall 2024 ______

## 2016-05-06 ENCOUNTER — Encounter: Payer: BLUE CROSS/BLUE SHIELD | Admitting: Family Medicine

## 2016-05-12 ENCOUNTER — Other Ambulatory Visit: Payer: Self-pay | Admitting: Family Medicine

## 2016-05-13 NOTE — Telephone Encounter (Signed)
Deny please.   From last note- S: stable on lexapro 20mg  and sparing xanax initially reported. Previously had been 4-5x a year. phq2 of 0 A/P: at end of visit patient asked nursing staff for refill of xanax but she had been given #60 in January with 3 refills- nursing staff asked patient to schedule a follow up to focus further on anxiety

## 2016-06-16 ENCOUNTER — Other Ambulatory Visit: Payer: Self-pay

## 2016-06-16 ENCOUNTER — Other Ambulatory Visit: Payer: Self-pay | Admitting: Internal Medicine

## 2016-07-05 ENCOUNTER — Other Ambulatory Visit: Payer: Self-pay

## 2016-07-05 MED ORDER — FLUTICASONE PROPIONATE 50 MCG/ACT NA SUSP: NASAL | 3 refills | Status: DC

## 2016-08-17 ENCOUNTER — Telehealth: Payer: Self-pay

## 2016-08-17 NOTE — Telephone Encounter (Signed)
Patients appointment cancelled as requested.   [employer] [employer] [facility] - Client Client Site [facility] - Night Physician Garret Reddish - MD Contact Type Call Who Is Calling Patient / Member / Family / Caregiver Caller Name [name] Caller Phone Number [phone] Patient Name Cynthia Berry Call Type Message Only Information Provided Reason for Call Request to [facility] Appointment Initial Comment Caller states she was called about her appt but the caller needs to CANCEL her appt for Thursday / Aug 19, 2016. Additional Comment Cell phone - [phone]. The caller states she needs to cancel her appt for 08/19/2016, but will call back to reschedule  Call Closed By: Bonnita Nasuti Transaction Date/Time: 08/16/2016 5:38:27 PM (ET)

## 2016-08-19 ENCOUNTER — Ambulatory Visit: Payer: Medicare Other | Admitting: Family Medicine

## 2016-09-06 ENCOUNTER — Ambulatory Visit (INDEPENDENT_AMBULATORY_CARE_PROVIDER_SITE_OTHER): Payer: Medicare Other

## 2016-09-06 DIAGNOSIS — Z23 Encounter for immunization: Secondary | ICD-10-CM | POA: Diagnosis not present

## 2016-12-31 ENCOUNTER — Other Ambulatory Visit: Payer: Self-pay | Admitting: Family Medicine

## 2016-12-31 DIAGNOSIS — Z1231 Encounter for screening mammogram for malignant neoplasm of breast: Secondary | ICD-10-CM

## 2017-01-04 ENCOUNTER — Other Ambulatory Visit: Payer: Self-pay | Admitting: Podiatry

## 2017-01-27 ENCOUNTER — Ambulatory Visit: Payer: Medicare Other | Admitting: Podiatry

## 2017-01-27 DIAGNOSIS — J0140 Acute pansinusitis, unspecified: Secondary | ICD-10-CM | POA: Diagnosis not present

## 2017-01-27 DIAGNOSIS — R05 Cough: Secondary | ICD-10-CM | POA: Diagnosis not present

## 2017-01-28 ENCOUNTER — Ambulatory Visit: Payer: Medicare Other

## 2017-02-03 ENCOUNTER — Ambulatory Visit
Admission: RE | Admit: 2017-02-03 | Discharge: 2017-02-03 | Disposition: A | Discharge status: Home / Self Care | Payer: Medicare Other | Source: Ambulatory Visit | Attending: Family Medicine | Admitting: Family Medicine

## 2017-02-03 DIAGNOSIS — Z1231 Encounter for screening mammogram for malignant neoplasm of breast: Secondary | ICD-10-CM

## 2017-02-05 ENCOUNTER — Encounter: Payer: Self-pay | Admitting: Family Medicine

## 2017-02-07 ENCOUNTER — Telehealth: Payer: Self-pay

## 2017-02-07 MED ORDER — PROMETHAZINE HCL 12.5 MG PO TABS: 12.5000 mg | ORAL_TABLET | Freq: Three times a day (TID) | ORAL | 0 refills | Status: DC | PRN

## 2017-02-07 NOTE — Telephone Encounter (Signed)
Received PA request from [facility] for Promethazine. PA submitted & is pending. Key: [id number]

## 2017-02-08 NOTE — Telephone Encounter (Signed)
PA approved, form faxed back to pharmacy. 

## 2017-04-19 ENCOUNTER — Encounter: Payer: Self-pay | Admitting: Family Medicine

## 2017-04-19 ENCOUNTER — Ambulatory Visit (INDEPENDENT_AMBULATORY_CARE_PROVIDER_SITE_OTHER): Payer: Medicare Other | Admitting: Family Medicine

## 2017-04-19 DIAGNOSIS — F325 Major depressive disorder, single episode, in full remission: Secondary | ICD-10-CM | POA: Diagnosis not present

## 2017-04-19 DIAGNOSIS — I10 Essential (primary) hypertension: Secondary | ICD-10-CM | POA: Diagnosis not present

## 2017-04-19 DIAGNOSIS — E785 Hyperlipidemia, unspecified: Secondary | ICD-10-CM | POA: Diagnosis not present

## 2017-04-19 DIAGNOSIS — R739 Hyperglycemia, unspecified: Secondary | ICD-10-CM

## 2017-04-19 DIAGNOSIS — M1A9XX0 Chronic gout, unspecified, without tophus (tophi): Secondary | ICD-10-CM

## 2017-04-19 MED ORDER — HYDROCHLOROTHIAZIDE 25 MG PO TABS: ORAL_TABLET | ORAL | 3 refills | Status: DC

## 2017-04-19 MED ORDER — SIMVASTATIN 20 MG PO TABS: 20.0000 mg | ORAL_TABLET | Freq: Every day | ORAL | 1 refills | Status: DC

## 2017-04-19 MED ORDER — ALPRAZOLAM 0.25 MG PO TABS: ORAL_TABLET | ORAL | 0 refills | Status: DC

## 2017-04-19 MED ORDER — ESCITALOPRAM OXALATE 20 MG PO TABS: ORAL_TABLET | ORAL | 3 refills | Status: DC

## 2017-04-19 NOTE — Assessment & Plan Note (Signed)
S: Depression and anxiety well with PHQ9 of 0 and GAD7 of 2. States uses a xanax at least once a month for more stressful situations such as grandsons recent surgery.  A/P: Refill Lexapro at 20 mg. Refill Xanax 0.25 mg #50 with hopes for this to last a ye and ar. she asks about coming down off Lexapro-discussed would not do this until she is off of Xanax

## 2017-04-19 NOTE — Assessment & Plan Note (Signed)
Encouraged need for healthy eating, regular exercise, weight loss. Would like to see at least 10 pounds off within 6 months and encouraged follow-up at that time

## 2017-04-19 NOTE — Patient Instructions (Addendum)
Really need to buckle down on healthy eating, regular exercise, weight loss. Would like to see at least 10 pounds off within 6 months and encouraged follow-up at that time  Schedule a lab visit at the check out desk in about 6 weeks- consider doing your annual wellness visit at that time. Return for future fasting labs meaning nothing but water after midnight please. Ok to take your medications with water.

## 2017-04-19 NOTE — Progress Notes (Signed)
Subjective:  Cynthia Berry is a 71 y.o. year old very pleasant female patient who presents for/with See problem oriented charting ROS- No chest pain or shortness of breath. No headache or blurry vision. Has had some but minimal weight loss   Past Medical History-  Patient Active Problem List   Diagnosis Date Noted  . Hx of adenomatous polyp of colon 04/30/2016    Priority: Medium  . Gout 12/28/2014    Priority: Medium  . Hyperlipidemia 09/30/2009    Priority: Medium  . Depression 05/22/2007    Priority: Medium  . Essential hypertension 05/22/2007    Priority: Medium  . Allergic rhinitis 12/28/2014    Priority: Low  . Hyperglycemia 08/31/2013    Priority: Low  . Morbid obesity ([place]) 04/19/2017    Medications- reviewed and updated Current Outpatient Prescriptions  Medication Sig Dispense Refill  . acetaminophen (TYLENOL) 500 MG tablet Take 500 mg by mouth every 6 (six) hours as needed.    . ALPRAZolam (XANAX) 0.25 MG tablet take 1 tablet by mouth twice a day if needed 50 tablet 0  . aspirin 81 MG tablet Take 81 mg by mouth daily.    Marland Kitchen escitalopram (LEXAPRO) 20 MG tablet take 1 tablet by mouth once daily 90 tablet 3  . fluticasone (FLONASE) 50 MCG/ACT nasal spray instill 2 sprays into each nostril once daily 16 g 3  . hydrochlorothiazide (HYDRODIURIL) 25 MG tablet take 1 tablet by mouth once daily 90 tablet 3  . ibuprofen (ADVIL,MOTRIN) 200 MG tablet Take 200 mg by mouth every 6 (six) hours as needed.    . simvastatin (ZOCOR) 20 MG tablet Take 1 tablet (20 mg total) by mouth daily at 6 PM. 90 tablet 1   No current facility-administered medications for this visit.     Objective: BP 124/68 (BP Location: Left Arm, Patient Position: Sitting, Cuff Size: Large)   Pulse (!) 102   Temp 98.8 F (37.1 C) (Oral)   Ht 5\' 1"  (1.549 m)   Wt 219 lb (99.3 kg)   SpO2 97%   BMI 41.38 kg/m  Gen: NAD, resting comfortably CV: RRR no murmurs rubs or gallops. Heart rate not elevated on my  exam. Lungs: CTAB no crackles, wheeze, rhonchi Abdomen: soft/nontender/nondistended/normal bowel sounds. Obese Ext: no edema Skin: warm, dry Neuro: grossly normal, moves all extremities  Assessment/Plan:  Depression S: Depression and anxiety well with PHQ9 of 0 and GAD7 of 2. States uses a xanax at least once a month for more stressful situations such as grandsons recent surgery.  A/P: Refill Lexapro at 20 mg. Refill Xanax 0.25 mg #50 with hopes for this to last a ye and ar. she asks about coming down off Lexapro-discussed would not do this until she is off of Xanax  Essential hypertension S: controlled on hctz 25mg .  ASCVD 10 year risk calculation if age 41-79: on statin BP Readings from Last 3 Encounters:  04/19/17 124/68  04/26/16 108/67  02/19/16 118/82  A/P: We discussed blood pressure goal of <140/90. Continue current meds  Hyperlipidemia S: mild poorly controlled on simvastatin 20mg  last check-likely poorly controlled as she ran out of medication. No myalgias.  Lab Results  Component Value Date   CHOL 181 02/11/2016   HDL 52.40 02/11/2016   LDLCALC 102 (H) 02/11/2016   LDLDIRECT 184.4 08/29/2013   TRIG 135.0 02/11/2016   CHOLHDL 3 02/11/2016   A/P:Refill simvastatin 20 mg with labs in 6 weeks   Gout No recent flares. Will continue  hydrochlorothiazide and was having more regular issues  Hyperglycemia Weight down slightly from last year but needs further weight loss. Will update A1c  Morbid obesity ([place]) Encouraged need for healthy eating, regular exercise, weight loss. Would like to see at least 10 pounds off within 6 months and encouraged follow-up at that time  Return in about 6 months (around 10/20/2017) for follow up- or sooner if needed.  Orders Placed This Encounter  Procedures  . Hemoglobin A1c    [place]    Standing Status:   Future    Standing Expiration Date:   04/19/2018  . CBC    Standing Status:   Future    Standing Expiration Date:   04/19/2018   . Comprehensive metabolic panel    [place]    Standing Status:   Future    Standing Expiration Date:   04/19/2018  . Uric acid    Standing Status:   Future    Standing Expiration Date:   04/19/2018  . Lipid panel    Standing Status:   Future    Standing Expiration Date:   04/19/2018    Meds ordered this encounter  Medications  . ALPRAZolam (XANAX) 0.25 MG tablet    Sig: take 1 tablet by mouth twice a day if needed    Dispense:  50 tablet    Refill:  0  . hydrochlorothiazide (HYDRODIURIL) 25 MG tablet    Sig: take 1 tablet by mouth once daily    Dispense:  90 tablet    Refill:  3  . simvastatin (ZOCOR) 20 MG tablet    Sig: Take 1 tablet (20 mg total) by mouth daily at 6 PM.    Dispense:  90 tablet    Refill:  1  . escitalopram (LEXAPRO) 20 MG tablet    Sig: take 1 tablet by mouth once daily    Dispense:  90 tablet    Refill:  3    Return precautions advised.  Garret Reddish, MD

## 2017-04-19 NOTE — Assessment & Plan Note (Signed)
Weight down slightly from last year but needs further weight loss. Will update A1c

## 2017-04-19 NOTE — Assessment & Plan Note (Signed)
S: controlled on hctz 25mg .  ASCVD 10 year risk calculation if age 70-79: on statin BP Readings from Last 3 Encounters:  04/19/17 124/68  04/26/16 108/67  02/19/16 118/82  A/P: We discussed blood pressure goal of <140/90. Continue current meds

## 2017-04-19 NOTE — Assessment & Plan Note (Signed)
No recent flares. Will continue hydrochlorothiazide and was having more regular issues

## 2017-04-19 NOTE — Assessment & Plan Note (Signed)
S: mild poorly controlled on simvastatin 20mg  last check-likely poorly controlled as she ran out of medication. No myalgias.  Lab Results  Component Value Date   CHOL 181 02/11/2016   HDL 52.40 02/11/2016   LDLCALC 102 (H) 02/11/2016   LDLDIRECT 184.4 08/29/2013   TRIG 135.0 02/11/2016   CHOLHDL 3 02/11/2016   A/P:Refill simvastatin 20 mg with labs in 6 weeks

## 2017-06-14 ENCOUNTER — Ambulatory Visit: Payer: Medicare Other

## 2017-06-14 ENCOUNTER — Other Ambulatory Visit: Payer: Medicare Other

## 2017-06-14 ENCOUNTER — Other Ambulatory Visit (INDEPENDENT_AMBULATORY_CARE_PROVIDER_SITE_OTHER): Payer: Medicare Other

## 2017-06-14 DIAGNOSIS — M1A9XX0 Chronic gout, unspecified, without tophus (tophi): Secondary | ICD-10-CM | POA: Diagnosis not present

## 2017-06-14 DIAGNOSIS — E785 Hyperlipidemia, unspecified: Secondary | ICD-10-CM | POA: Diagnosis not present

## 2017-06-14 DIAGNOSIS — R739 Hyperglycemia, unspecified: Secondary | ICD-10-CM | POA: Diagnosis not present

## 2017-06-14 DIAGNOSIS — I10 Essential (primary) hypertension: Secondary | ICD-10-CM | POA: Diagnosis not present

## 2017-06-14 LAB — COMPREHENSIVE METABOLIC PANEL
ALBUMIN: 3.8 g/dL (ref 3.5–5.2)
ALK PHOS: 57 U/L (ref 39–117)
ALT: 11 U/L (ref 0–35)
AST: 15 U/L (ref 0–37)
BILIRUBIN TOTAL: 0.7 mg/dL (ref 0.2–1.2)
BUN: 12 mg/dL (ref 6–23)
CALCIUM: 9.5 mg/dL (ref 8.4–10.5)
CO2: 29 mEq/L (ref 19–32)
CREATININE: 0.66 mg/dL (ref 0.40–1.20)
Chloride: 99 mEq/L (ref 96–112)
GFR: 93.98 mL/min (ref >60.00)
Glucose, Bld: 118 mg/dL — ABNORMAL HIGH (ref 70–99)
Potassium: 3.9 mEq/L (ref 3.5–5.1)
Sodium: 138 mEq/L (ref 135–145)
Total Protein: 7.1 g/dL (ref 6.0–8.3)

## 2017-06-14 LAB — LIPID PANEL
CHOLESTEROL: 169 mg/dL (ref 0–200)
HDL: 52.6 mg/dL (ref >39.00)
LDL Cholesterol: 96 mg/dL (ref 0–99)
NonHDL: 116.68
Total CHOL/HDL Ratio: 3
Triglycerides: 104 mg/dL (ref 0.0–149.0)
VLDL: 20.8 mg/dL (ref 0.0–40.0)

## 2017-06-14 LAB — CBC
HCT: 40.9 % (ref 36.0–46.0)
Hemoglobin: 13.5 g/dL (ref 12.0–15.0)
MCHC: 33.1 g/dL (ref 30.0–36.0)
MCV: 87.3 fl (ref 78.0–100.0)
PLATELETS: 315 10*3/uL (ref 150.0–400.0)
RBC: 4.68 Mil/uL (ref 3.87–5.11)
RDW: 13.3 % (ref 11.5–15.5)
WBC: 7.9 10*3/uL (ref 4.0–10.5)

## 2017-06-14 LAB — HEMOGLOBIN A1C: HEMOGLOBIN A1C: 5.8 % (ref 4.6–6.5)

## 2017-06-14 LAB — URIC ACID: Uric Acid, Serum: 8.5 mg/dL — ABNORMAL HIGH (ref 2.4–7.0)

## 2017-08-05 ENCOUNTER — Ambulatory Visit (INDEPENDENT_AMBULATORY_CARE_PROVIDER_SITE_OTHER): Payer: Medicare Other | Admitting: *Deleted

## 2017-08-05 DIAGNOSIS — Z23 Encounter for immunization: Secondary | ICD-10-CM | POA: Diagnosis not present

## 2017-08-05 NOTE — Progress Notes (Signed)
I have reviewed and agree with note, evaluation, plan.   Lakayla Barrington, MD  

## 2017-08-05 NOTE — Progress Notes (Signed)
Pt received high dose flu vaccine. No distress noted.

## 2017-08-29 ENCOUNTER — Telehealth: Payer: Self-pay | Admitting: Family Medicine

## 2017-08-29 NOTE — Telephone Encounter (Signed)
MEDICATION: GOUT MEDICATION (name unknown)  PHARMACY:  [id number] - [place], [place] - [address] AT [address] A 90 DAY SUPPLY : no  IS PATIENT OUT OF MEDICATION: yes  IF NOT; HOW MUCH IS LEFT: n/a  LAST APPOINTMENT DATE: @7 /10/18  NEXT APPOINTMENT DATE:@1 /08/2018  OTHER COMMENTS: Patient has a flare up and does not know the name of the medication   **Let patient know to contact pharmacy at the end of the day to make sure medication is ready. **  ** Please notify patient to allow 48-72 hours to process**  **Encourage patient to contact the pharmacy for refills or they can request refills through [facility]**

## 2017-08-29 NOTE — Telephone Encounter (Signed)
Yes thanks, may refill indomethacin I gave on 06/12/15

## 2017-08-29 NOTE — Telephone Encounter (Signed)
Please see message and advise on refill?

## 2017-08-30 ENCOUNTER — Other Ambulatory Visit: Payer: Self-pay | Admitting: *Deleted

## 2017-08-30 DIAGNOSIS — M1A9XX0 Chronic gout, unspecified, without tophus (tophi): Secondary | ICD-10-CM

## 2017-08-30 MED ORDER — INDOMETHACIN 25 MG PO CAPS: ORAL_CAPSULE | ORAL | 1 refills | Status: DC

## 2017-08-30 NOTE — Telephone Encounter (Signed)
Prescription refill sent.

## 2017-08-30 NOTE — Telephone Encounter (Signed)
Patient called to inquire about her medication refill. I advised Cassie who was nearby and she stated she would take care of it. The patient asked how long before it would be sent and I advised her that it is being taken care of now and to give the pharmacy an hour (9:30am) before calling/ going to pick it up.

## 2017-10-21 ENCOUNTER — Ambulatory Visit: Payer: Medicare Other | Admitting: Family Medicine

## 2017-10-31 ENCOUNTER — Other Ambulatory Visit: Payer: Self-pay

## 2017-10-31 MED ORDER — SIMVASTATIN 20 MG PO TABS: 20.0000 mg | ORAL_TABLET | Freq: Every day | ORAL | 1 refills | Status: DC

## 2017-11-02 ENCOUNTER — Other Ambulatory Visit: Payer: Self-pay

## 2017-11-02 ENCOUNTER — Telehealth: Payer: Self-pay | Admitting: Family Medicine

## 2017-11-02 MED ORDER — SIMVASTATIN 20 MG PO TABS: 20.0000 mg | ORAL_TABLET | Freq: Every day | ORAL | 0 refills | Status: DC

## 2017-11-02 NOTE — Telephone Encounter (Signed)
Copied from [place]. Topic: Quick Communication - See Telephone Encounter >> Nov 02, 2017  1:45 PM Hewitt Shorts wrote: CRM for notification. See Telephone encounter for:  Pt is needing a refill on simvastation best number [phone] or cell [phone] has switched pharmacy-[facility] [phone] 11/02/17.

## 2017-11-03 ENCOUNTER — Encounter: Payer: Self-pay | Admitting: Family Medicine

## 2017-11-04 ENCOUNTER — Encounter: Payer: Self-pay | Admitting: Family Medicine

## 2017-11-24 ENCOUNTER — Telehealth: Payer: Medicare Other | Admitting: Physician Assistant

## 2017-11-24 DIAGNOSIS — J019 Acute sinusitis, unspecified: Secondary | ICD-10-CM

## 2017-11-24 DIAGNOSIS — B9689 Other specified bacterial agents as the cause of diseases classified elsewhere: Secondary | ICD-10-CM

## 2017-11-24 MED ORDER — DOXYCYCLINE HYCLATE 100 MG PO CAPS: 100.0000 mg | ORAL_CAPSULE | Freq: Two times a day (BID) | ORAL | 0 refills | Status: DC

## 2017-11-24 NOTE — Progress Notes (Signed)

## 2017-11-27 ENCOUNTER — Encounter: Payer: Self-pay | Admitting: Family Medicine

## 2017-11-29 ENCOUNTER — Ambulatory Visit: Payer: Medicare Other | Admitting: Family Medicine

## 2017-11-29 ENCOUNTER — Ambulatory Visit (INDEPENDENT_AMBULATORY_CARE_PROVIDER_SITE_OTHER): Payer: Medicare Other | Admitting: Family Medicine

## 2017-11-29 ENCOUNTER — Encounter: Payer: Self-pay | Admitting: Family Medicine

## 2017-11-29 VITALS — BP 134/88 | HR 105 | Temp 97.3°F | Ht 61.0 in | Wt 219.2 lb

## 2017-11-29 DIAGNOSIS — J0110 Acute frontal sinusitis, unspecified: Secondary | ICD-10-CM | POA: Diagnosis not present

## 2017-11-29 DIAGNOSIS — R195 Other fecal abnormalities: Secondary | ICD-10-CM | POA: Diagnosis not present

## 2017-11-29 DIAGNOSIS — Z1159 Encounter for screening for other viral diseases: Secondary | ICD-10-CM

## 2017-11-29 NOTE — Progress Notes (Signed)
PCP: Marin Olp, MD  Subjective:  Cynthia Berry is a 72 y.o. year old very pleasant female patient who presents with resolved sinusitis symptoms including nasal congestion, sinus tenderness. Now she has diarrhea after the antibiotics. See below -other symptoms include/day of illness: had 10 days of symptoms prior to 11/24/17 when was given doxycycline through e visit. Has had significant improvement on doxycyline- took 5 doses total (stopped due to her antibiotics). Pressure is now gone. Sinus congestion is better.  - had slightly higher anxiety while sick and that is now better- she had taken a few xanax- now back off.  - stool color was tannish which concerned her. Imodium helped the diarrhea but stool was still tannish.  - husband had pancreatic cancer- and weights very heavily on her  ROS-denies fever, SOB, NVD, tooth pain. Sinus pressure now resolved  Pertinent Past Medical History-  Patient Active Problem List   Diagnosis Date Noted  . Hx of adenomatous polyp of colon 04/30/2016    Priority: Medium  . Gout 12/28/2014    Priority: Medium  . Hyperlipidemia 09/30/2009    Priority: Medium  . Depression 05/22/2007    Priority: Medium  . Essential hypertension 05/22/2007    Priority: Medium  . Allergic rhinitis 12/28/2014    Priority: Low  . Hyperglycemia 08/31/2013    Priority: Low  . Morbid obesity ([place]) 04/19/2017    Medications- reviewed  Current Outpatient Medications  Medication Sig Dispense Refill  . acetaminophen (TYLENOL) 500 MG tablet Take 500 mg by mouth every 6 (six) hours as needed.    . ALPRAZolam (XANAX) 0.25 MG tablet take 1 tablet by mouth twice a day if needed 50 tablet 0  . aspirin 81 MG tablet Take 81 mg by mouth daily.    Marland Kitchen doxycycline (VIBRAMYCIN) 100 MG capsule Take 1 capsule (100 mg total) by mouth 2 (two) times daily. 20 capsule 0  . escitalopram (LEXAPRO) 20 MG tablet take 1 tablet by mouth once daily 90 tablet 3  . fluticasone (FLONASE) 50 MCG/ACT  nasal spray instill 2 sprays into each nostril once daily 16 g 3  . hydrochlorothiazide (HYDRODIURIL) 25 MG tablet take 1 tablet by mouth once daily 90 tablet 3  . ibuprofen (ADVIL,MOTRIN) 200 MG tablet Take 200 mg by mouth every 6 (six) hours as needed.    . indomethacin (INDOCIN) 25 MG capsule 2 caps po TID x 48 hours then 1 cap po TID until pain resolves. Do not take mobic while taking this. 45 capsule 1  . simvastatin (ZOCOR) 20 MG tablet Take 1 tablet (20 mg total) by mouth daily at 6 PM. 30 tablet 0   No current facility-administered medications for this visit.     Objective: BP 134/88 (BP Location: Left Arm, Patient Position: Sitting, Cuff Size: Large)   Pulse (!) 105   Temp (!) 97.3 F (36.3 C) (Oral)   Ht 5\' 1"  (1.549 m)   Wt 219 lb 3.2 oz (99.4 kg)   SpO2 96%   BMI 41.42 kg/m  Gen: NAD, resting comfortably HEENT: Turbinates normal, TM normal, pharynx mildly erythematous with no tonsilar exudate or edema, no sinus tenderness CV: RRR no murmurs rubs or gallops Lungs: CTAB no crackles, wheeze, rhonchi Ext: no edema Skin: warm, dry, no rash  Assessment/Plan:  Sinusitis resolved with doxycycline x5 doses- possibly this was viral given complete resolution on short course  Diarrhea/stool color change- we will check amylase and lipase as well as LFTs per patient strong  request- given history of pancreatic cancer in [place] and color change. I reassured her this is likely due to the antibiotics. Discussed probiotic option and eventual return of stool to normal color over next month most likely  Diagnosis associations: Change in stool - Plan: Lipase, Comprehensive metabolic panel, Amylase  Encounter for HCV screening test for low risk patient - Plan: Hepatitis C antibody (using opportunity for bloodwork to screen)  Return precautions discussed Garret Reddish, MD

## 2017-11-29 NOTE — Patient Instructions (Addendum)
Please stop by lab before you go  Thrilled sinus infection is better. Very common for antibiotics to cause diarrhea and color change of stool. Could take activia yogurt once a day or probiotic- but even if you do not do this probably will resolve within a month- happy to see you back if it does not

## 2017-11-30 ENCOUNTER — Encounter: Payer: Self-pay | Admitting: Family Medicine

## 2017-11-30 LAB — COMPREHENSIVE METABOLIC PANEL
ALBUMIN: 3.8 g/dL (ref 3.5–5.2)
ALK PHOS: 56 U/L (ref 39–117)
ALT: 15 U/L (ref 0–35)
AST: 17 U/L (ref 0–37)
BUN: 9 mg/dL (ref 6–23)
CALCIUM: 9.7 mg/dL (ref 8.4–10.5)
CO2: 31 mEq/L (ref 19–32)
Chloride: 99 mEq/L (ref 96–112)
Creatinine, Ser: 0.57 mg/dL (ref 0.40–1.20)
GFR: 111.15 mL/min (ref >60.00)
Glucose, Bld: 100 mg/dL — ABNORMAL HIGH (ref 70–99)
POTASSIUM: 3.6 meq/L (ref 3.5–5.1)
Sodium: 139 mEq/L (ref 135–145)
TOTAL PROTEIN: 7.3 g/dL (ref 6.0–8.3)
Total Bilirubin: 0.6 mg/dL (ref 0.2–1.2)

## 2017-11-30 LAB — HEPATITIS C ANTIBODY
HEP C AB: NONREACTIVE
SIGNAL TO CUT-OFF: 0.03 (ref <1.00)

## 2017-11-30 LAB — AMYLASE: AMYLASE: 32 U/L (ref 27–131)

## 2017-11-30 LAB — LIPASE: Lipase: 1 U/L — ABNORMAL LOW (ref 11.0–59.0)

## 2017-12-05 ENCOUNTER — Other Ambulatory Visit: Payer: Self-pay

## 2017-12-05 DIAGNOSIS — R195 Other fecal abnormalities: Secondary | ICD-10-CM

## 2017-12-20 ENCOUNTER — Other Ambulatory Visit (INDEPENDENT_AMBULATORY_CARE_PROVIDER_SITE_OTHER): Payer: Medicare Other

## 2017-12-20 DIAGNOSIS — R195 Other fecal abnormalities: Secondary | ICD-10-CM | POA: Diagnosis not present

## 2017-12-20 LAB — LIPASE: LIPASE: 11 U/L (ref 11.0–59.0)

## 2017-12-21 ENCOUNTER — Encounter: Payer: Self-pay | Admitting: Family Medicine

## 2018-01-19 DIAGNOSIS — J0100 Acute maxillary sinusitis, unspecified: Secondary | ICD-10-CM | POA: Diagnosis not present

## 2018-01-19 DIAGNOSIS — J309 Allergic rhinitis, unspecified: Secondary | ICD-10-CM | POA: Diagnosis not present

## 2018-02-10 ENCOUNTER — Other Ambulatory Visit: Payer: Self-pay | Admitting: Family Medicine

## 2018-02-10 ENCOUNTER — Other Ambulatory Visit: Payer: Self-pay

## 2018-02-10 ENCOUNTER — Telehealth: Payer: Self-pay | Admitting: *Deleted

## 2018-02-10 DIAGNOSIS — M1A9XX0 Chronic gout, unspecified, without tophus (tophi): Secondary | ICD-10-CM

## 2018-02-10 MED ORDER — INDOMETHACIN 25 MG PO CAPS
ORAL_CAPSULE | ORAL | 1 refills | Status: DC
Start: 2018-02-10 — End: 2018-05-16

## 2018-02-10 NOTE — Telephone Encounter (Signed)
Copied from [place] [phone]. Topic: Quick Communication - See Telephone Encounter >> Feb 10, 2018  7:58 AM Arletha Grippe wrote: CRM for notification. See Telephone encounter for: 02/10/18. Pt says she has gout in her left big toie, she has had this before. Pt decline to make an appt, due to having to go to work.  [facility] [address]   Pt would like to have a call if not going to fill rx. Please call [phone]

## 2018-02-10 NOTE — Telephone Encounter (Signed)
Pt called to check status, she is requesting call on cell # [phone]. She said she needs to know if Dr. Yong Channel will call in medicine or if she should go to Urgent Care. She has to be at work at 10:00am. I advised we have no guarantee on what time message will be responded to and if she is feeling that badly she should seek attention at Urgent Care. Pt still requested call on cell.

## 2018-02-10 NOTE — Telephone Encounter (Signed)
See note

## 2018-02-10 NOTE — Telephone Encounter (Signed)
Called patient to see if she went to urgent care or got her prescription. Urgent care wait was too long so she couldn't be seen because she had to get to work. I sent in her prescription (indomethacin) for her.

## 2018-02-10 NOTE — Telephone Encounter (Signed)
Pt returned call - please call [phone]

## 2018-02-10 NOTE — Telephone Encounter (Signed)
Called patient and left a voicemail message asking for a return phone call. Did she go to Urgent Care? Does she still need prescription?

## 2018-02-10 NOTE — Telephone Encounter (Signed)
It looks to me like we already filled this? Looks like an rx went through on 02/10/18. Am I missing something?

## 2018-02-13 NOTE — Telephone Encounter (Signed)
Once again I am not sure if you need anything from me on this prescription.  Please let me know

## 2018-02-28 ENCOUNTER — Ambulatory Visit (INDEPENDENT_AMBULATORY_CARE_PROVIDER_SITE_OTHER): Payer: Medicare Other

## 2018-02-28 ENCOUNTER — Other Ambulatory Visit: Payer: Self-pay | Admitting: Podiatry

## 2018-02-28 ENCOUNTER — Encounter: Payer: Self-pay | Admitting: Podiatry

## 2018-02-28 ENCOUNTER — Ambulatory Visit (INDEPENDENT_AMBULATORY_CARE_PROVIDER_SITE_OTHER): Payer: Medicare Other | Admitting: Podiatry

## 2018-02-28 DIAGNOSIS — B353 Tinea pedis: Secondary | ICD-10-CM

## 2018-02-28 DIAGNOSIS — M109 Gout, unspecified: Secondary | ICD-10-CM

## 2018-02-28 MED ORDER — NAFTIFINE HCL 2 % EX CREA: 1.0000 "application " | TOPICAL_CREAM | Freq: Two times a day (BID) | CUTANEOUS | 2 refills | Status: DC

## 2018-02-28 MED ORDER — MELOXICAM 15 MG PO TABS: 15.0000 mg | ORAL_TABLET | Freq: Every day | ORAL | 3 refills | Status: DC

## 2018-02-28 MED ORDER — METHYLPREDNISOLONE 4 MG PO TBPK: ORAL_TABLET | ORAL | 0 refills | Status: DC

## 2018-03-01 ENCOUNTER — Telehealth: Payer: Self-pay | Admitting: Podiatry

## 2018-03-01 MED ORDER — KETOCONAZOLE 2 % EX CREA: 1.0000 "application " | TOPICAL_CREAM | Freq: Every day | CUTANEOUS | 0 refills | Status: DC

## 2018-03-01 NOTE — Telephone Encounter (Signed)
I informed pt she should take the 6 day steroid pack as directed and after completing it she should began the meloxicam the following day.

## 2018-03-01 NOTE — Progress Notes (Signed)
Subjective:  Patient ID: Cynthia Berry, female    DOB: 07-23-1946,  MRN: [id number] HPI Chief Complaint  Patient presents with  . Foot Pain    1st MPJ left - aching x 3 weeks, was severely swollen and red, PCP Rx'd indomethacin, some better but still uncomfortable  . New Patient (Initial Visit)    Est pt - 05/2015    72 y.o. female presents with the above complaint.   ROS: Denies fever chills nausea vomiting muscle aches and pains.  Past Medical History:  Diagnosis Date  . Anemia   . Anxiety    Pt very anxious/sensitive to certain meds!  . Depression   . Hx of adenomatous polyp of colon 04/30/2016  . Hyperlipidemia   . Hypertension   . Post-operative nausea and vomiting    due to nerves   Past Surgical History:  Procedure Laterality Date  . TONSILLECTOMY     as child    Current Outpatient Medications:  .  ALPRAZolam (XANAX) 0.25 MG tablet, take 1 tablet by mouth twice a day if needed, Disp: 50 tablet, Rfl: 0 .  aspirin 81 MG tablet, Take 81 mg by mouth daily., Disp: , Rfl:  .  Aspirin-Calcium Carbonate 81-777 MG TABS, Take by mouth., Disp: , Rfl:  .  escitalopram (LEXAPRO) 20 MG tablet, take 1 tablet by mouth once daily, Disp: 90 tablet, Rfl: 3 .  hydrochlorothiazide (HYDRODIURIL) 25 MG tablet, take 1 tablet by mouth once daily, Disp: 90 tablet, Rfl: 3 .  indomethacin (INDOCIN) 25 MG capsule, 2 caps po TID x 48 hours then 1 cap po TID until pain resolves. Do not take mobic while taking this., Disp: 45 capsule, Rfl: 1 .  meloxicam (MOBIC) 15 MG tablet, Take 1 tablet (15 mg total) by mouth daily., Disp: 30 tablet, Rfl: 3 .  methylPREDNISolone (MEDROL DOSEPAK) 4 MG TBPK tablet, 6 day dose pack - take as directed, Disp: 21 tablet, Rfl: 0 .  Naftifine HCl (NAFTIN) 2 % CREA, Apply 1 application topically 2 (two) times daily., Disp: 60 g, Rfl: 2 .  simvastatin (ZOCOR) 20 MG tablet, Take 1 tablet (20 mg total) by mouth daily at 6 PM., Disp: 30 tablet, Rfl: 0  Allergies  Allergen  Reactions  . Doxycycline   . Penicillins     REACTION: swelling,rash  . Sulfa Antibiotics     Per pt, no reaction to Sulfa,but you avoid these meds.   Review of Systems Objective:  There were no vitals filed for this visit.  General: Well developed, nourished, in no acute distress, alert and oriented x3   Dermatological: Skin is warm, dry and supple bilateral. Nails x 10 are well maintained; remaining integument appears unremarkable at this time. There are no open sores, no preulcerative lesions, no rash or signs of infection present.  Tinea pedis and onychomycosis to the right foot.  Vascular: Dorsalis Pedis artery and Posterior Tibial artery pedal pulses are 2/4 bilateral with immedate capillary fill time. Pedal hair growth present. No varicosities and no lower extremity edema present bilateral.   Neruologic: Grossly intact via light touch bilateral. Vibratory intact via tuning fork bilateral. Protective threshold with Semmes Wienstein monofilament intact to all pedal sites bilateral. Patellar and Achilles deep tendon reflexes 2+ bilateral. No Babinski or clonus noted bilateral.   Musculoskeletal: No gross boney pedal deformities bilateral. No pain, crepitus, or limitation noted with foot and ankle range of motion bilateral. Muscular strength 5/5 in all groups tested bilateral.  Mild to moderate  swelling about the first metatarsophalangeal joint of the left foot with mild erythema overlying the prominence of the first metatarsal head.  Moderately tender on range of motion.  Appears to be resolving gout.  Gait: Unassisted, Nonantalgic.    Radiographs:  Radiographs taken today demonstrate no acute findings soft tissue swelling around the first metatarsophalangeal joint with some joint space narrowing.  Assessment & Plan:   Assessment: Resolving gouty capsulitis left foot.  Tinea pedis and onychomycosis right foot.  Plan: Discussed etiology pathology conservative versus surgical  therapies.  Offered her an injection she declined.  At this point I placed her on a Medrol Dosepak to be followed by meloxicam.  Naftin cream 1% applied twice daily was prescribed.  If she is not improved in the next 2 to 3 weeks with the pain in the first metatarsophalangeal joint she will notify us immediately.     Max T. [place], [place]

## 2018-03-01 NOTE — Telephone Encounter (Signed)
I saw Dr. Milinda Pointer yesterday and he prescribed me two prescriptions. The pharmacist told me not to take both of them at the same time and Dr. Milinda Pointer did not tell me this. I was confused and I don't know if I processed what the pharmacist said differently than what she meant. I just wanted to get clarification on that. The first medication is methylprednisolone Dosepak and the other prescription is meloxicam. These are the 2 she said I should not take together but I wasn't aware of that. If someone can call me back as soon as they can I would appreciate it. My mobile number is [phone] and my home number is [phone]. Thank you. Bye bye.

## 2018-03-01 NOTE — Telephone Encounter (Signed)
You never take these together.  I always tell patients the six day pack first then start the one aday pill after the six day pack is complete.

## 2018-03-01 NOTE — Telephone Encounter (Signed)
Try ketoconozole.

## 2018-03-07 ENCOUNTER — Encounter: Payer: Self-pay | Admitting: Podiatry

## 2018-03-13 ENCOUNTER — Other Ambulatory Visit: Payer: Self-pay | Admitting: Family Medicine

## 2018-03-22 ENCOUNTER — Telehealth: Payer: Self-pay | Admitting: *Deleted

## 2018-03-22 DIAGNOSIS — M109 Gout, unspecified: Secondary | ICD-10-CM

## 2018-03-24 LAB — CBC WITH DIFFERENTIAL/PLATELET
BASOS ABS: 42 {cells}/uL (ref 0–200)
Basophils Relative: 0.6 %
EOS ABS: 112 {cells}/uL (ref 15–500)
Eosinophils Relative: 1.6 %
HEMATOCRIT: 37.1 % (ref 35.0–45.0)
HEMOGLOBIN: 12.4 g/dL (ref 11.7–15.5)
LYMPHS ABS: 1897 {cells}/uL (ref 850–3900)
MCH: 28.8 pg (ref 27.0–33.0)
MCHC: 33.4 g/dL (ref 32.0–36.0)
MCV: 86.1 fL (ref 80.0–100.0)
MONOS PCT: 9.1 %
MPV: 10.9 fL (ref 7.5–12.5)
Neutro Abs: 4312 cells/uL (ref 1500–7800)
Neutrophils Relative %: 61.6 %
Platelets: 288 10*3/uL (ref 140–400)
RBC: 4.31 10*6/uL (ref 3.80–5.10)
RDW: 12.7 % (ref 11.0–15.0)
Total Lymphocyte: 27.1 %
WBC: 7 10*3/uL (ref 3.8–10.8)
WBCMIX: 637 {cells}/uL (ref 200–950)

## 2018-03-24 LAB — SEDIMENTATION RATE: Sed Rate: 43 mm/h — ABNORMAL HIGH (ref 0–30)

## 2018-03-24 LAB — C-REACTIVE PROTEIN: CRP: 15 mg/L — ABNORMAL HIGH (ref <8.0)

## 2018-03-24 LAB — RHEUMATOID FACTOR

## 2018-03-24 LAB — URIC ACID: Uric Acid, Serum: 8 mg/dL — ABNORMAL HIGH (ref 2.5–7.0)

## 2018-03-24 LAB — ANA: ANA: NEGATIVE

## 2018-03-29 ENCOUNTER — Telehealth: Payer: Self-pay | Admitting: *Deleted

## 2018-03-29 MED ORDER — COLCHICINE 0.6 MG PO TABS: ORAL_TABLET | ORAL | 0 refills | Status: DC

## 2018-03-29 NOTE — Telephone Encounter (Signed)
I informed pt of Dr. Stephenie Acres review of results and instructions for the colchicine. Pt states it is some better, but painful to touch, did she need to take the medication. I told pt it was her decision but it was designed to get rid of the gout.

## 2018-03-29 NOTE — Telephone Encounter (Signed)
-----   Message from Garrel Ridgel, [place] sent at 03/28/2018  7:07 AM EDT ----- Gout.  Start her on colchicine 0.6 mg.  One po tid x 3 days or stomach upset then one po qd.

## 2018-03-29 NOTE — Telephone Encounter (Signed)
This is Lamount Cranker. I received a message to call back from [place], [place]. I was returning the call. If you could call me back at [phone]. Thank you.

## 2018-03-29 NOTE — Telephone Encounter (Signed)
I informed pt of Dr. Hyatt's review of results and orders. 

## 2018-04-28 ENCOUNTER — Other Ambulatory Visit: Payer: Self-pay | Admitting: Family Medicine

## 2018-05-16 ENCOUNTER — Encounter: Payer: Self-pay | Admitting: Family Medicine

## 2018-05-16 ENCOUNTER — Ambulatory Visit (INDEPENDENT_AMBULATORY_CARE_PROVIDER_SITE_OTHER): Payer: Medicare Other | Admitting: Family Medicine

## 2018-05-16 VITALS — BP 136/82 | HR 95 | Temp 98.5°F | Ht 61.0 in | Wt 219.4 lb

## 2018-05-16 DIAGNOSIS — E785 Hyperlipidemia, unspecified: Secondary | ICD-10-CM

## 2018-05-16 DIAGNOSIS — F325 Major depressive disorder, single episode, in full remission: Secondary | ICD-10-CM | POA: Diagnosis not present

## 2018-05-16 DIAGNOSIS — I10 Essential (primary) hypertension: Secondary | ICD-10-CM | POA: Diagnosis not present

## 2018-05-16 DIAGNOSIS — M1A9XX0 Chronic gout, unspecified, without tophus (tophi): Secondary | ICD-10-CM

## 2018-05-16 DIAGNOSIS — R739 Hyperglycemia, unspecified: Secondary | ICD-10-CM | POA: Diagnosis not present

## 2018-05-16 LAB — CBC
HCT: 39.3 % (ref 36.0–46.0)
Hemoglobin: 13.2 g/dL (ref 12.0–15.0)
MCHC: 33.7 g/dL (ref 30.0–36.0)
MCV: 86.8 fl (ref 78.0–100.0)
Platelets: 324 10*3/uL (ref 150.0–400.0)
RBC: 4.53 Mil/uL (ref 3.87–5.11)
RDW: 12.8 % (ref 11.5–15.5)
WBC: 7.6 10*3/uL (ref 4.0–10.5)

## 2018-05-16 LAB — COMPREHENSIVE METABOLIC PANEL
ALBUMIN: 4 g/dL (ref 3.5–5.2)
ALT: 12 U/L (ref 0–35)
AST: 14 U/L (ref 0–37)
Alkaline Phosphatase: 56 U/L (ref 39–117)
BILIRUBIN TOTAL: 0.7 mg/dL (ref 0.2–1.2)
BUN: 13 mg/dL (ref 6–23)
CHLORIDE: 100 meq/L (ref 96–112)
CO2: 30 meq/L (ref 19–32)
Calcium: 10.1 mg/dL (ref 8.4–10.5)
Creatinine, Ser: 0.72 mg/dL (ref 0.40–1.20)
GFR: 84.78 mL/min (ref >60.00)
Glucose, Bld: 118 mg/dL — ABNORMAL HIGH (ref 70–99)
Potassium: 4.6 mEq/L (ref 3.5–5.1)
Sodium: 138 mEq/L (ref 135–145)
Total Protein: 7.2 g/dL (ref 6.0–8.3)

## 2018-05-16 LAB — HEMOGLOBIN A1C: Hgb A1c MFr Bld: 5.8 % (ref 4.6–6.5)

## 2018-05-16 LAB — LDL CHOLESTEROL, DIRECT: LDL DIRECT: 104 mg/dL

## 2018-05-16 MED ORDER — AMLODIPINE BESYLATE 2.5 MG PO TABS: 2.5000 mg | ORAL_TABLET | Freq: Every day | ORAL | 5 refills | Status: DC

## 2018-05-16 MED ORDER — ALPRAZOLAM 0.25 MG PO TABS: ORAL_TABLET | ORAL | 0 refills | Status: DC

## 2018-05-16 NOTE — Progress Notes (Signed)
Subjective:  Cynthia Berry is a 72 y.o. year old very pleasant female patient who presents for/with See problem oriented charting ROS- no edema. Recent right great toe pain. No fever or chills. Some warmth along toe- no spreading rash   Past Medical History-  Patient Active Problem List   Diagnosis Date Noted  . Hx of adenomatous polyp of colon 04/30/2016    Priority: Medium  . Gout 12/28/2014    Priority: Medium  . Hyperlipidemia 09/30/2009    Priority: Medium  . Depression 05/22/2007    Priority: Medium  . Essential hypertension 05/22/2007    Priority: Medium  . Allergic rhinitis 12/28/2014    Priority: Low  . Hyperglycemia 08/31/2013    Priority: Low  . Morbid obesity ([place]) 04/19/2017    Medications- reviewed and updated Current Outpatient Medications  Medication Sig Dispense Refill  . ALPRAZolam (XANAX) 0.25 MG tablet take 1 tablet by mouth twice a day if needed 50 tablet 0  . amLODipine (NORVASC) 2.5 MG tablet Take 1 tablet (2.5 mg total) by mouth daily. 30 tablet 5  . aspirin 81 MG tablet Take 81 mg by mouth daily.    Marland Kitchen escitalopram (LEXAPRO) 20 MG tablet TAKE 1 TABLET BY MOUTH EVERY DAY 90 tablet 1  . ketoconazole (NIZORAL) 2 % cream Apply 1 application topically daily. 15 g 0  . meloxicam (MOBIC) 15 MG tablet Take 1 tablet (15 mg total) by mouth daily. 30 tablet 3   hctz 25mg  daily     . simvastatin (ZOCOR) 20 MG tablet TAKE 1 TABLET (20 MG TOTAL) BY MOUTH DAILY AT 6 PM 90 tablet 2   Objective: BP 136/82 (BP Location: Left Arm, Patient Position: Sitting, Cuff Size: Normal)   Pulse 95   Temp 98.5 F (36.9 C) (Oral)   Ht 5\' 1"  (1.549 m)   Wt 219 lb 6.4 oz (99.5 kg)   SpO2 95%   BMI 41.46 kg/m  Gen: NAD, resting comfortably CV: RRR no murmurs rubs or gallops Lungs: CTAB no crackles, wheeze, rhonchi Abdomen: soft/nontender/nondistended/normal bowel sounds.obese  Ext: no edema Skin: warm, dry MSK: right great toe with very mild tenderness at MTP  joint  Assessment/Plan:  Essential hypertension S: controlled on hctz 25mg  but could be causing gout flares BP Readings from Last 3 Encounters:  05/16/18 136/82  11/29/17 134/88  04/19/17 124/68  A/P: We discussed blood pressure goal of <140/90. Start amlodipine 2.5mg  when you get back from the beach and follow up in 1 month for blood pressure check  Gout S: 2-3 flares in last 3 months on no rx for lowering uric acid  Had a gout flare a week ago last Monday- started meloxicam which she had left over from another attack. That took care of it. Has had 2 gout attacks since the spring. She thinks sugary drinks like soda are contributing Lab Results  Component Value Date   LABURIC 8.0 (H) 03/22/2018   A/P: we will stop hctz and cut down on sugar sweetened beverages- if continues to have flares we discussed starting allopurinol- she wants to hold off on adding additional medicine unless she has to  Depression S: controlled on lexapro 20mg . She was given xanax #50 a year ago with hopes it would last a year- has been 13 months- she uses sparingly for more stressful situations. phq9 of 0 today A/P: continue current rx  Hyperlipidemia S: well controlled on  simvasttin 20mg  last september  A/P: mild poor control today with LDL over  100 at 105- we discussed focusing on healthy eating/regular exercise/weight loss instead of increasing statin strength which could also increase risk of diabetes  Hyperglycemia S:   Stable weight over last year. Feels like gout flares have held her back Lab Results  Component Value Date   HGBA1C 5.8 05/16/2018   HGBA1C 5.8 06/14/2017   HGBA1C 5.7 02/19/2016  A/P: encouraged weight loss to help lower risk of diabetes and risks from morbid obesity with BMI >40  1 month BP check  Lab/Order associations: Essential hypertension - Plan: CBC, Comprehensive metabolic panel  Hyperlipidemia, unspecified hyperlipidemia type - Plan: LDL cholesterol,  direct  Hyperglycemia - Plan: Hemoglobin A1c  Chronic gout without tophus, unspecified cause, unspecified site  Major depressive disorder with single episode, in full remission ([place])  Meds ordered this encounter  Medications  . ALPRAZolam (XANAX) 0.25 MG tablet    Sig: take 1 tablet by mouth twice a day if needed    Dispense:  50 tablet    Refill:  0  . amLODipine (NORVASC) 2.5 MG tablet    Sig: Take 1 tablet (2.5 mg total) by mouth daily.    Dispense:  30 tablet    Refill:  5    Return precautions advised.  Garret Reddish, MD

## 2018-05-16 NOTE — Assessment & Plan Note (Signed)
S: well controlled on  simvasttin 20mg  last september  A/P: mild poor control today with LDL over 100 at 105- we discussed focusing on healthy eating/regular exercise/weight loss instead of increasing statin strength which could also increase risk of diabetes

## 2018-05-16 NOTE — Patient Instructions (Addendum)
Health Maintenance Due  Topic Date Due  . TETANUS/TDAP - Patient only wants to do it if she needed 08/11/2010  . DEXA SCAN - Schedule your bone density test at check out desk. You may also call directly to X-ray at [phone] to schedule an appointment that is convenient for you.  - located [address] across the [address] from [place] - in the basement - you do need an appointment for the bone density tests.  12/26/2011  . INFLUENZA VACCINE - We will await until October. 05/11/2018  . I would also like for you to sign up for an annual wellness visit with one of our nurses, Cassie or Manuela Schwartz, who both specialize in the annual wellness visit. This is a free benefit under medicare that may help [place] find additional ways to help you. Some highlights are reviewing medications, lifestyle, and doing a dementia screen.   . Please check with your pharmacy to see if they have the shingrix vaccine. If they do- please get this immunization and update [place] by phone call or mychart with dates you receive the vaccine     Start amlodipine 2.5mg  when you get back from the beach and follow up in 1 month for blood pressure check (afte ryou get back from the beach  Continue to work on weight loss. Would love in 6 months for you to get at least 5 lbs off  Cut down on the sugary drinks to help reduce gout flares as well (cutting out most ideal)  Please stop by lab before you go

## 2018-05-16 NOTE — Assessment & Plan Note (Signed)
S: 2-3 flares in last 3 months on no rx for lowering uric acid  Had a gout flare a week ago last Monday- started meloxicam which she had left over from another attack. That took care of it. Has had 2 gout attacks since the spring. She thinks sugary drinks like soda are contributing Lab Results  Component Value Date   LABURIC 8.0 (H) 03/22/2018   A/P: we will stop hctz and cut down on sugar sweetened beverages- if continues to have flares we discussed starting allopurinol- she wants to hold off on adding additional medicine unless she has to

## 2018-05-16 NOTE — Assessment & Plan Note (Signed)
S: controlled on lexapro 20mg . She was given xanax #50 a year ago with hopes it would last a year- has been 13 months- she uses sparingly for more stressful situations. phq9 of 0 today A/P: continue current rx

## 2018-05-16 NOTE — Assessment & Plan Note (Signed)
S:   Stable weight over last year. Feels like gout flares have held her back Lab Results  Component Value Date   HGBA1C 5.8 05/16/2018   HGBA1C 5.8 06/14/2017   HGBA1C 5.7 02/19/2016  A/P: encouraged weight loss to help lower risk of diabetes and risks from morbid obesity with BMI >40

## 2018-05-16 NOTE — Assessment & Plan Note (Signed)
S: controlled on hctz 25mg  but could be causing gout flares BP Readings from Last 3 Encounters:  05/16/18 136/82  11/29/17 134/88  04/19/17 124/68  A/P: We discussed blood pressure goal of <140/90. Start amlodipine 2.5mg  when you get back from the beach and follow up in 1 month for blood pressure check

## 2018-07-04 ENCOUNTER — Encounter: Payer: Self-pay | Admitting: Family Medicine

## 2018-07-10 ENCOUNTER — Encounter: Payer: Self-pay | Admitting: Family Medicine

## 2018-07-10 ENCOUNTER — Ambulatory Visit (INDEPENDENT_AMBULATORY_CARE_PROVIDER_SITE_OTHER): Payer: Medicare Other | Admitting: Family Medicine

## 2018-07-10 VITALS — BP 122/76 | HR 97 | Temp 98.1°F | Ht 61.0 in | Wt 220.8 lb

## 2018-07-10 DIAGNOSIS — I10 Essential (primary) hypertension: Secondary | ICD-10-CM | POA: Diagnosis not present

## 2018-07-10 DIAGNOSIS — M1A9XX0 Chronic gout, unspecified, without tophus (tophi): Secondary | ICD-10-CM

## 2018-07-10 DIAGNOSIS — Z780 Asymptomatic menopausal state: Secondary | ICD-10-CM

## 2018-07-10 NOTE — Progress Notes (Signed)
Subjective:  Cynthia Berry is a 72 y.o. year old very pleasant female patient who presents for/with See problem oriented charting ROS- no fever or chills. Did have some joint swelling and pain after trauma now resolved. No worsening pretibial edema.  No chest pain reported.   Past Medical History-  Patient Active Problem List   Diagnosis Date Noted  . Hx of adenomatous polyp of colon 04/30/2016    Priority: Medium  . Gout 12/28/2014    Priority: Medium  . Hyperlipidemia 09/30/2009    Priority: Medium  . Depression 05/22/2007    Priority: Medium  . Essential hypertension 05/22/2007    Priority: Medium  . Allergic rhinitis 12/28/2014    Priority: Low  . Hyperglycemia 08/31/2013    Priority: Low  . Morbid obesity ([place]) 04/19/2017    Medications- reviewed and updated Current Outpatient Medications  Medication Sig Dispense Refill  . ALPRAZolam (XANAX) 0.25 MG tablet take 1 tablet by mouth twice a day if needed 50 tablet 0  . amLODipine (NORVASC) 2.5 MG tablet Take 1 tablet (2.5 mg total) by mouth daily. 30 tablet 5  . aspirin 81 MG tablet Take 81 mg by mouth daily.    Marland Kitchen escitalopram (LEXAPRO) 20 MG tablet TAKE 1 TABLET BY MOUTH EVERY DAY 90 tablet 1  . ketoconazole (NIZORAL) 2 % cream Apply 1 application topically daily. 15 g 0  . meloxicam (MOBIC) 15 MG tablet Take 1 tablet (15 mg total) by mouth daily. 30 tablet 3  . simvastatin (ZOCOR) 20 MG tablet TAKE 1 TABLET (20 MG TOTAL) BY MOUTH DAILY AT 6 PM 90 tablet 2   No current facility-administered medications for this visit.     Objective: BP 122/76 (BP Location: Left Arm, Patient Position: Sitting, Cuff Size: Large)   Pulse 97   Temp 98.1 F (36.7 C) (Oral)   Ht 5\' 1"  (1.549 m)   Wt 220 lb 12.8 oz (100.2 kg)   LMP  (LMP Unknown)   SpO2 95%   BMI 41.72 kg/m  Gen: NAD, resting comfortably CV: RRR no murmurs rubs or gallops Lungs: CTAB no crackles, wheeze, rhonchi Abdomen: soft/nontender/nondistended/normal bowel sounds. No  rebound or guarding.  Ext: trace nonpitting edema, no left first toe MTP pain Skin: warm, dry, no erythema over foot  Assessment/Plan:   Essential hypertension S: controlled on amlodipine 2.5mg . Stopped hctz due to gout and associated flares.  BP Readings from Last 3 Encounters:  07/10/18 122/76  05/16/18 136/82  11/29/17 134/88  A/P: We discussed blood pressure goal of <140/90. Continue current meds:  Doing really well on this other than mild edema which is largely unchanged from prior   Gout S: She was at [employer] and did a lot of dancing and walking. She hit the left first MTP joint- she noted some swelling and pain but not nearly as bad as a typical gout flare- in fact she wasn't sure if it was a gout flare. No other obvious gout flares since stopping hctz. We had discussed allopurinol if having flares off hctz A/P: Patient wants to remain off uric acid lowering agent though agrees to return if has another potential gout flare between now and next 6 month visit to consider medication like allopurinol   Future Appointments  Date Time Provider [place]  08/07/2018  1:30 PM [facility] DEXA 1 [facility]  01/04/2019  1:40 PM Marin Olp, MD [facility]   Lab/Order associations: Postmenopausal - Plan: DG Bone Density  Essential hypertension  Chronic gout without tophus, unspecified cause, unspecified site   Return precautions advised.  Garret Reddish, MD

## 2018-07-10 NOTE — Patient Instructions (Addendum)
Health Maintenance Due  Topic Date Due  . DEXA SCAN-Schedule your bone density test at check out desk. You may also call directly to X-ray at [phone] to schedule an appointment that is convenient for you.  - located [address] across the [address] from [place] - in the basement - you do need an appointment for the bone density tests.     12/26/2011  . INFLUENZA VACCINE -please call our office to schedule this late October/November 05/11/2018   Blood pressure looks great! Continue current prescription. If you check at home- goal is <140/90 on regular basis.   Lets check in 6 months from now- sooner if you have more gout flares

## 2018-07-10 NOTE — Assessment & Plan Note (Signed)
S: She was at [employer] and did a lot of dancing and walking. She hit the left first MTP joint- she noted some swelling and pain but not nearly as bad as a typical gout flare- in fact she wasn't sure if it was a gout flare. No other obvious gout flares since stopping hctz. We had discussed allopurinol if having flares off hctz A/P: Patient wants to remain off uric acid lowering agent though agrees to return if has another potential gout flare between now and next 6 month visit to consider medication like allopurinol

## 2018-07-10 NOTE — Assessment & Plan Note (Signed)
S: controlled on amlodipine 2.5mg . Stopped hctz due to gout and associated flares.  BP Readings from Last 3 Encounters:  07/10/18 122/76  05/16/18 136/82  11/29/17 134/88  A/P: We discussed blood pressure goal of <140/90. Continue current meds:  Doing really well on this other than mild edema which is largely unchanged from prior

## 2018-07-17 DIAGNOSIS — J069 Acute upper respiratory infection, unspecified: Secondary | ICD-10-CM | POA: Diagnosis not present

## 2018-08-07 ENCOUNTER — Inpatient Hospital Stay: Admission: RE | Admit: 2018-08-07 | Payer: Medicare Other | Source: Ambulatory Visit

## 2018-08-09 ENCOUNTER — Ambulatory Visit (INDEPENDENT_AMBULATORY_CARE_PROVIDER_SITE_OTHER): Payer: Medicare Other | Admitting: Surgical

## 2018-08-09 ENCOUNTER — Encounter: Payer: Self-pay | Admitting: Family Medicine

## 2018-08-09 DIAGNOSIS — Z23 Encounter for immunization: Secondary | ICD-10-CM

## 2018-10-09 ENCOUNTER — Ambulatory Visit: Payer: Self-pay | Admitting: *Deleted

## 2018-10-09 NOTE — Telephone Encounter (Signed)
Pt calling with complaints of having sinus pain and congestion since the week before Christmas. Pt states that she has pain from the top of her eyebrows down into her cheeks. Pt states that she has a constant dull headache that she currently rates at 8. Pt states that she also coughs up a dark colored sputum that is yellowish-brown in color that sometimes has a tinge of red. Pt states she has not checked her temperature and states she has been taking tylenol and ibuprofen to help with the pressure she feels in her head. Pt scheduled for appt with Dr. Jerline Pain on 10/10/18 and advised to seek treatment in the Urgent Care/ED if symptoms become worse before scheduled appt. Pt verbalized understanding. Pt also states she is going on a cruise this weekend and wanted to know if Dr. Yong Channel would write a prescription for phenergan. Pt states she was given a few pills previously by Dr. Yong Channel to take while on the cruise.   Reason for Disposition . [1] Sinus congestion (pressure, fullness) AND [2] present > 10 days  Answer Assessment - Initial Assessment Questions 1. LOCATION: "Where does it hurt?"     Above eyebrows and down into cheeks 2. ONSET: "When did the sinus pain start?"  (e.g., hours, days)      A week before christmas 3. SEVERITY: "How bad is the pain?"   (Scale 1-10; mild, moderate or severe)   - MILD (1-3): doesn't interfere with normal activities    - MODERATE (4-7): interferes with normal activities (e.g., work or school) or awakens from sleep   - SEVERE (8-10): excruciating pain and patient unable to do any normal activities        8 4. RECURRENT SYMPTOM: "Have you ever had sinus problems before?" If so, ask: "When was the last time?" and "What happened that time?"      *No Answer* 5. NASAL CONGESTION: "Is the nose blocked?" If so, ask, "Can you open it or must you breathe through the mouth?"    Yes nose feels blocked and has to breath through the mouth 6. NASAL DISCHARGE: "Do you have  discharge from your nose?" If so ask, "What color?"     Sometimes with blowing 7. FEVER: "Do you have a fever?" If so, ask: "What is it, how was it measured, and when did it start?"      Didn't check if I did it may have been christmas day 8. OTHER SYMPTOMS: "Do you have any other symptoms?" (e.g., sore throat, cough, earache, difficulty breathing)     no  Protocols used: SINUS PAIN OR CONGESTION-A-AH

## 2018-10-10 ENCOUNTER — Encounter: Payer: Self-pay | Admitting: Family Medicine

## 2018-10-10 ENCOUNTER — Ambulatory Visit (INDEPENDENT_AMBULATORY_CARE_PROVIDER_SITE_OTHER): Payer: Medicare Other | Admitting: Family Medicine

## 2018-10-10 VITALS — BP 124/72 | HR 101 | Temp 98.3°F | Ht 61.0 in | Wt 221.0 lb

## 2018-10-10 DIAGNOSIS — J329 Chronic sinusitis, unspecified: Secondary | ICD-10-CM | POA: Diagnosis not present

## 2018-10-10 DIAGNOSIS — Z87898 Personal history of other specified conditions: Secondary | ICD-10-CM

## 2018-10-10 MED ORDER — PROMETHAZINE HCL 12.5 MG PO TABS: 12.5000 mg | ORAL_TABLET | Freq: Three times a day (TID) | ORAL | 0 refills | Status: DC | PRN

## 2018-10-10 MED ORDER — AZITHROMYCIN 250 MG PO TABS: ORAL_TABLET | ORAL | 0 refills | Status: DC

## 2018-10-10 MED ORDER — IPRATROPIUM BROMIDE 0.06 % NA SOLN: 2.0000 | Freq: Four times a day (QID) | NASAL | 0 refills | Status: DC

## 2018-10-10 NOTE — Patient Instructions (Signed)
Start the atrovent and azithromycin.  I will send in a few phenergan for your motion sickness as well.  Please stay well hydrated.  You can take tylenol and/or motrin as needed for low grade fever and pain.  Please let me know if your symptoms worsen or fail to improve.  Take care, Dr Jerline Pain

## 2018-10-10 NOTE — Progress Notes (Signed)
   Subjective:  Cynthia Berry is a 72 y.o. female who presents today for same-day appointment with a chief complaint of sinus pain.   HPI:  Sinus Pain, Acute problem Started about 2 weeks ago.  Symptoms are worsening. Pain located at the top of her eyebrows and radiates into her cheeks.  Associated with constant dull headache, productive cough, low grade fevers, and malaise.  She has tried taking Tylenol and ibuprofen with her head pressure which helps with her symptoms.  She also tried mucinex and zyrtec with no improvement. No known sick contacts. No other obvious alleviating or aggravating factors.   History of Motion Sickness Will be going on a cruise ot the [place] in a few days. Has had issues with motion sickness in the past. She has tolerated low dose phenergan in the past without significant side effects.   ROS: Per HPI  PMH: She reports that she has never smoked. She has never used smokeless tobacco. She reports current alcohol use. She reports that she does not use drugs.  Objective:  Physical Exam: BP 124/72 (BP Location: Left Arm, Patient Position: Sitting, Cuff Size: Large)   Pulse (!) 101   Temp 98.3 F (36.8 C) (Oral)   Ht 5\' 1"  (1.549 m)   Wt 221 lb (100.2 kg)   LMP  (LMP Unknown)   SpO2 96%   BMI 41.76 kg/m   Gen: NAD, resting comfortably HEENT: TMs with clear effusion bilaterally.  OP slightly erythematous with no exudate.  Nasal mucosa erythematous and boggy bilaterally with clear discharge. CV: RRR with no murmurs appreciated Pulm: NWOB, CTAB with no crackles, wheezes, or rhonchi  Assessment/Plan:  Sinusitis Given that symptoms have been persistent for the past 2 weeks, we will.  She has had in the past.  Will start Z-Pak today.  Also start Atrovent nasal spray.  Encouraged good oral hydration.  Recommended continuing Tylenol and/or Motrin as needed.  Return precautions and reasons to seek emergent care reviewed. Follow up as needed.   History of Motion  Sickness Sent in small supply of phenergan. Discussed potential side effects.   Algis Greenhouse. Jerline Pain, MD 10/10/2018 8:44 AM

## 2018-10-10 NOTE — Telephone Encounter (Signed)
Patient is being seen at this time.

## 2018-10-10 NOTE — Telephone Encounter (Signed)
See note

## 2018-11-20 ENCOUNTER — Other Ambulatory Visit: Payer: Self-pay | Admitting: Family Medicine

## 2018-12-12 ENCOUNTER — Other Ambulatory Visit: Payer: Self-pay | Admitting: Family Medicine

## 2019-01-04 ENCOUNTER — Ambulatory Visit: Payer: Medicare Other | Admitting: Family Medicine

## 2019-01-05 ENCOUNTER — Other Ambulatory Visit: Payer: Self-pay | Admitting: Family Medicine

## 2019-01-05 ENCOUNTER — Other Ambulatory Visit: Payer: Self-pay

## 2019-01-05 MED ORDER — AMLODIPINE BESYLATE 2.5 MG PO TABS: 2.5000 mg | ORAL_TABLET | Freq: Every day | ORAL | 0 refills | Status: DC

## 2019-01-08 ENCOUNTER — Other Ambulatory Visit: Payer: Self-pay | Admitting: Family Medicine

## 2019-01-08 ENCOUNTER — Ambulatory Visit: Payer: Medicare Other | Admitting: Family Medicine

## 2019-01-08 DIAGNOSIS — Z1231 Encounter for screening mammogram for malignant neoplasm of breast: Secondary | ICD-10-CM

## 2019-01-11 ENCOUNTER — Ambulatory Visit (INDEPENDENT_AMBULATORY_CARE_PROVIDER_SITE_OTHER): Payer: Medicare Other | Admitting: Family Medicine

## 2019-01-11 ENCOUNTER — Encounter: Payer: Self-pay | Admitting: Family Medicine

## 2019-01-11 ENCOUNTER — Telehealth: Payer: Self-pay | Admitting: Family Medicine

## 2019-01-11 VITALS — BP 127/83 | Temp 98.0°F | Ht 61.0 in | Wt 218.0 lb

## 2019-01-11 DIAGNOSIS — E785 Hyperlipidemia, unspecified: Secondary | ICD-10-CM | POA: Diagnosis not present

## 2019-01-11 DIAGNOSIS — I10 Essential (primary) hypertension: Secondary | ICD-10-CM

## 2019-01-11 DIAGNOSIS — F325 Major depressive disorder, single episode, in full remission: Secondary | ICD-10-CM

## 2019-01-11 MED ORDER — ESCITALOPRAM OXALATE 20 MG PO TABS: 20.0000 mg | ORAL_TABLET | Freq: Every day | ORAL | 3 refills | Status: DC

## 2019-01-11 MED ORDER — ALPRAZOLAM 0.25 MG PO TABS: ORAL_TABLET | ORAL | 0 refills | Status: DC

## 2019-01-11 MED ORDER — AMLODIPINE BESYLATE 2.5 MG PO TABS: 2.5000 mg | ORAL_TABLET | Freq: Every day | ORAL | 3 refills | Status: DC

## 2019-01-11 MED ORDER — SIMVASTATIN 20 MG PO TABS: 20.0000 mg | ORAL_TABLET | Freq: Every day | ORAL | 3 refills | Status: DC

## 2019-01-11 NOTE — Progress Notes (Signed)
Phone [phone]   Subjective:  Virtual visit via Video note  Our team/I connected with Cynthia Berry on 01/11/19 at  4:00 PM EDT by a video enabled telemedicine application (webex) and verified that I am speaking with the correct person using two identifiers.  Location patient: [facility] Location provider: [facility], office Persons participating in the virtual visit: patient  Our team/I discussed the limitations of evaluation and management by telemedicine and the availability of in person appointments. In light of current covid-19 pandemic, patient also understands that we are trying to protect them by minimizing in office contact if at all possible.  The patient expressed consent for telemedicine visit and agreed to proceed.   ROS- No chest pain or shortness of breath. No headache or blurry vision.    Past Medical History-  Patient Active Problem List   Diagnosis Date Noted  . Hx of adenomatous polyp of colon 04/30/2016    Priority: Medium  . Gout 12/28/2014    Priority: Medium  . Hyperlipidemia 09/30/2009    Priority: Medium  . Depression 05/22/2007    Priority: Medium  . Essential hypertension 05/22/2007    Priority: Medium  . Allergic rhinitis 12/28/2014    Priority: Low  . Hyperglycemia 08/31/2013    Priority: Low  . Morbid obesity ([place]) 04/19/2017    Medications- reviewed and updated Current Outpatient Medications  Medication Sig Dispense Refill  . ALPRAZolam (XANAX) 0.25 MG tablet take 1 tablet by mouth twice a day if needed 40 tablet 0  . amLODipine (NORVASC) 2.5 MG tablet Take 1 tablet (2.5 mg total) by mouth daily. 90 tablet 3  . aspirin 81 MG tablet Take 81 mg by mouth daily.    Marland Kitchen escitalopram (LEXAPRO) 20 MG tablet Take 1 tablet (20 mg total) by mouth daily. 90 tablet 3  . simvastatin (ZOCOR) 20 MG tablet Take 1 tablet (20 mg total) by mouth daily at 6 PM. 90 tablet 3  . meloxicam (MOBIC) 15 MG tablet Take 1 tablet (15 mg total) by mouth daily. (Patient not  taking: Reported on 01/11/2019) 30 tablet 3   No current facility-administered medications for this visit.      Objective:  BP 127/83 (BP Location: Left Arm, Patient Position: Sitting, Cuff Size: Normal)   Temp 98 F (36.7 C) (Oral)   Ht 5\' 1"  (1.549 m)   Wt 218 lb (98.9 kg)   LMP  (LMP Unknown)   BMI 41.19 kg/m  Gen: NAD, resting comfortably Lungs: nonlabored, normal respiratory rate  Skin: warm, dry, no obvious rash Normal speech     Assessment and Plan   #hypertension S: controlled on amlodipine 2.5 mg BP Readings from Last 3 Encounters:  01/11/19 127/83  10/10/18 124/72  07/10/18 122/76  A/P: Stable. Continue current medications.   # Depression S: depression well controlled on lexapro 20mg . Also uses sparing xanax to help with anxiety- has used abotu 40 tablets over 6 months Depression screen [facility] 2/9 01/11/2019  Decreased Interest 0  Down, Depressed, Hopeless 0  PHQ - 2 Score 0  Altered sleeping 0  Tired, decreased energy 0  Change in appetite 0  Feeling bad or failure about yourself  0  Trouble concentrating 0  Moving slowly or fidgety/restless 0  Suicidal thoughts 0  PHQ-9 Score 0  Difficult doing work/chores Not difficult at all  A/P: Stable. Continue current medications.   #hyperlipidemia/morbid obesity/hyperglycemia S:  Mildly poorly controlled on simvastatin 20mg . She is down a few lbs from last visit  but wants to focus on lifestyle changes. She has not been exercising much- doing some brief walks outside or indoors but being very sedentary. Ordered some bands and is trying to get those.  - she is tryign to cut down on portion sizes Lab Results  Component Value Date   CHOL 169 06/14/2017   HDL 52.60 06/14/2017   LDLCALC 96 06/14/2017   LDLDIRECT 104.0 05/16/2018   TRIG 104.0 06/14/2017   CHOLHDL 3 06/14/2017   A/P: likely mild poor control- continue to try to improve this with lifestyle for both obesity and hyperlipidemia- Encouraged need for healthy  eating, regular exercise, weight loss.  Lab Results  Component Value Date   HGBA1C 5.8 05/16/2018  -will update a1c next visit as well  Other notes: 1.meloxicam through Dr. Milinda Pointer last year- has not needed this after getting off hctz- so pains may have been gout related   Future Appointments  Date Time Provider [place]  03/27/2019 10:00 AM [facility] MM 3 [facility]  03/27/2019 10:30 AM [facility] DX DEXA 1 [facility]   3-6 month follow up   Lab/Order associations: Hyperlipidemia, unspecified hyperlipidemia type  Essential hypertension  Major depressive disorder with single episode, in full remission ([place])  Morbid obesity ([place])  Meds ordered this encounter  Medications  . amLODipine (NORVASC) 2.5 MG tablet    Sig: Take 1 tablet (2.5 mg total) by mouth daily.    Dispense:  90 tablet    Refill:  3  . escitalopram (LEXAPRO) 20 MG tablet    Sig: Take 1 tablet (20 mg total) by mouth daily.    Dispense:  90 tablet    Refill:  3  . simvastatin (ZOCOR) 20 MG tablet    Sig: Take 1 tablet (20 mg total) by mouth daily at 6 PM.    Dispense:  90 tablet    Refill:  3  . ALPRAZolam (XANAX) 0.25 MG tablet    Sig: take 1 tablet by mouth twice a day if needed    Dispense:  40 tablet    Refill:  0    Return precautions advised.  Garret Reddish, MD

## 2019-01-11 NOTE — Telephone Encounter (Signed)
Called pt and left VM to call the office.  

## 2019-01-11 NOTE — Telephone Encounter (Signed)
Pt has been set up ans taking care of.

## 2019-01-11 NOTE — Patient Instructions (Addendum)
Health Maintenance Due  Topic Date Due  . DEXA SCAN - this is set up for June 16th- hoping it can get completed.  12/26/2011   3-6 months for follow up  Video visit

## 2019-01-11 NOTE — Telephone Encounter (Signed)
Called pt to schedule webex. Pt is unsure if shell be able to do it. Please call pt and advise. 4pm is on hold for pt.

## 2019-01-24 ENCOUNTER — Encounter: Payer: Self-pay | Admitting: Family Medicine

## 2019-03-23 ENCOUNTER — Other Ambulatory Visit: Payer: Self-pay | Admitting: Family Medicine

## 2019-03-23 DIAGNOSIS — E2839 Other primary ovarian failure: Secondary | ICD-10-CM

## 2019-03-27 ENCOUNTER — Ambulatory Visit
Admission: RE | Admit: 2019-03-27 | Discharge: 2019-03-27 | Disposition: A | Discharge status: Home / Self Care | Payer: Medicare Other | Source: Ambulatory Visit | Attending: Family Medicine | Admitting: Family Medicine

## 2019-03-27 ENCOUNTER — Other Ambulatory Visit: Payer: Self-pay

## 2019-03-27 ENCOUNTER — Encounter: Payer: Self-pay | Admitting: Family Medicine

## 2019-03-27 DIAGNOSIS — Z780 Asymptomatic menopausal state: Secondary | ICD-10-CM | POA: Diagnosis not present

## 2019-03-27 DIAGNOSIS — M85851 Other specified disorders of bone density and structure, right thigh: Secondary | ICD-10-CM | POA: Diagnosis not present

## 2019-03-27 DIAGNOSIS — Z1231 Encounter for screening mammogram for malignant neoplasm of breast: Secondary | ICD-10-CM | POA: Diagnosis not present

## 2019-03-27 DIAGNOSIS — E2839 Other primary ovarian failure: Secondary | ICD-10-CM

## 2019-03-27 DIAGNOSIS — M8580 Other specified disorders of bone density and structure, unspecified site: Secondary | ICD-10-CM | POA: Insufficient documentation

## 2019-03-28 ENCOUNTER — Telehealth: Payer: Self-pay | Admitting: Family Medicine

## 2019-03-28 NOTE — Telephone Encounter (Signed)
Patient returning call to receive imagine results. Office created CRM to give NT permission to address. Please advise.

## 2019-05-03 ENCOUNTER — Telehealth: Payer: Self-pay | Admitting: Family Medicine

## 2019-05-03 NOTE — Telephone Encounter (Signed)
Spoke with pt, she has a friend that is Dr. Ansel Bong patient and she is having some Covid symptoms. It has been about 3 weeks since pt has been around her. She is currently asymptomatic. She says that her friend just started having symptoms this past Monday.   I advised pt that incubation time is about 2 weeks so its likely that her friend may have been exposed about 2 weeks ago if her sx just started. I told pt to contact the office for a virtual visit if she starts to develop any sx of Covid (such as fever, cough, SOB, v/d, loss of taste or smell, runny nose, sore throat ) and we could get her set up for testing at that time.   Pt verbalized understanding.

## 2019-05-03 NOTE — Telephone Encounter (Signed)
See note  Copied from [place] [id number]. Topic: General - Other >> May 03, 2019 11:22 AM Keene Breath wrote: Reason for CRM: Patient called to ask that the nurse call her regarding some questions she has about COVID.  CB# [phone]

## 2019-06-21 ENCOUNTER — Encounter: Payer: Self-pay | Admitting: Family Medicine

## 2019-06-21 ENCOUNTER — Other Ambulatory Visit: Payer: Self-pay

## 2019-06-21 ENCOUNTER — Other Ambulatory Visit (INDEPENDENT_AMBULATORY_CARE_PROVIDER_SITE_OTHER): Payer: Medicare Other

## 2019-06-21 ENCOUNTER — Ambulatory Visit (INDEPENDENT_AMBULATORY_CARE_PROVIDER_SITE_OTHER): Payer: Medicare Other | Admitting: Family Medicine

## 2019-06-21 VITALS — Ht 61.0 in | Wt 218.0 lb

## 2019-06-21 DIAGNOSIS — R300 Dysuria: Secondary | ICD-10-CM

## 2019-06-21 DIAGNOSIS — B3749 Other urogenital candidiasis: Secondary | ICD-10-CM

## 2019-06-21 DIAGNOSIS — R3129 Other microscopic hematuria: Secondary | ICD-10-CM

## 2019-06-21 DIAGNOSIS — R319 Hematuria, unspecified: Secondary | ICD-10-CM

## 2019-06-21 LAB — POCT URINALYSIS DIPSTICK
Bilirubin, UA: NEGATIVE
Blood, UA: POSITIVE
Glucose, UA: NEGATIVE
Ketones, UA: NEGATIVE
Leukocytes, UA: NEGATIVE
Nitrite, UA: NEGATIVE
Protein, UA: NEGATIVE
Spec Grav, UA: 1.015 (ref 1.010–1.025)
Urobilinogen, UA: 0.2 E.U./dL
pH, UA: 7 (ref 5.0–8.0)

## 2019-06-21 MED ORDER — CIPROFLOXACIN HCL 500 MG PO TABS: 500.0000 mg | ORAL_TABLET | Freq: Two times a day (BID) | ORAL | 0 refills | Status: DC

## 2019-06-21 MED ORDER — CLOTRIMAZOLE 1 % EX OINT: 1.0000 "application " | TOPICAL_OINTMENT | Freq: Two times a day (BID) | CUTANEOUS | 1 refills | Status: DC

## 2019-06-21 NOTE — Addendum Note (Signed)
Addended by: Francis Dowse T on: 06/21/2019 03:16 PM   Modules accepted: Orders

## 2019-06-21 NOTE — Progress Notes (Signed)
Patient: Cynthia Berry MRN: [id number] DOB: 04/21/1946 PCP: Marin Olp, MD    I connected with Lamount Cranker on 06/21/19 a 1:54pm by a video enabled telemedicine application and verified that I am speaking with the correct person using two identifiers.  Location patient: Home Location provider: [facility], Office Persons participating in this virtual visit: Roes Capotosto and Dr. Rogers Blocker   I discussed the limitations of evaluation and management by telemedicine and the availability of in person appointments. The patient expressed understanding and agreed to proceed.   Subjective:  Chief Complaint  Patient presents with  . Urinary Tract Infection    pelvic pressure, discomfort in lower back x several days    HPI: The patient is a 73 y.o. female who presents today for pelvic pressure and mild lower back pain x several days. She states yesterday afternoon she went to the bathroom and her lower abdomen felt tight. She noticed a really strong odor and her urine was murky and cloudy. She wasn't really in any pain, just pressure and bloating in her lower abdomen. She started drinking more water and her urine got cleared and the odor was less pungent. This morning when she urinated the odor was there and it was cloudy, but has gotten better throughout the day. Her stomach reminds tight and heavy and uncomfortable feeling. She has had no fever/chills, no CVA tenderness. She doesn't have dysuria, but states that at the start of her urine stream it feels off and then it's gone. No visible blood in her urine, no urgency. Very mild frequency. She also has some vaginal itching and thinks she has a yeast infection on her external labia.   Review of Systems  Constitutional: Negative for chills, fatigue and fever.  Respiratory: Negative for shortness of breath.   Cardiovascular: Negative for chest pain.  Gastrointestinal: Negative for abdominal pain, diarrhea, nausea and vomiting.  Genitourinary: Positive for  frequency (very mild frequency ). Negative for dysuria, flank pain, hematuria and urgency.  Musculoskeletal: Positive for back pain.       C/o mild lower back pain    Allergies Patient is allergic to doxycycline; penicillins; and sulfa antibiotics.  Past Medical History Patient  has a past medical history of Anemia, Anxiety, Depression, adenomatous polyp of colon (04/30/2016), Hyperlipidemia, Hypertension, and Post-operative nausea and vomiting.  Surgical History Patient  has a past surgical history that includes Tonsillectomy.  Family History Pateint's family history includes Heart disease in her sister; Heart disease (age of onset: 80) in her father; Lung cancer in her sister; Stroke (age of onset: 61) in her mother.  Social History Patient  reports that she has never smoked. She has never used smokeless tobacco. She reports current alcohol use. She reports that she does not use drugs.    Objective: Vitals:   06/21/19 1328  Weight: 218 lb (98.9 kg)  Height: 5\' 1"  (1.549 m)    Body mass index is 41.19 kg/m.  Physical Exam Vitals signs reviewed.  Constitutional:      Appearance: Normal appearance.  HENT:     Head: Normocephalic and atraumatic.  Pulmonary:     Effort: Pulmonary effort is normal.  Neurological:     General: No focal deficit present.     Mental Status: She is alert and oriented to person, place, and time.      Urine dipstick shows negative for all components except for blood.      Assessment/plan: .1. Dysuria Dipstick completely normal except for blood. Tried  to add on UA microscopy, but not enough urine. See below.will do short course of cipro due to allergies and f/u on culture. She is anxious and does not want to wait on culture. precautions given and if culture negative and symptoms persist, needs to come in for exam and further work up.  - POCT Urinalysis Dipstick; Future - Urine Culture; Future  2. Microscopic hematuria Checking urine  microscopy as UA does not look very suspicious for infection. Discussed could be false positive. She does not have enough urine to add this on, so we will call her and see if she wants to return or wait and repeat when she sees her pcp for annual/labs.  - Urinalysis, Routine w reflex microscopic  3. Candida infection of genital region Could be contributing to some of her symptoms. Has no vaginal discharge and just itching on external labia. clotrimazole bid. If not better needs to be seen for appointment.      Return in about 1 month (around 07/21/2019) for annual/labs with dr. Retail banker .     Orma Flaming, MD [place]  06/21/2019

## 2019-06-21 NOTE — Progress Notes (Signed)
Called and spoke to patient.  There was not enough urine to send for micro today.  Dipstick was + for blood.  Patient will return tomorrow morning and leave another sterile UA to be sent out for micro.  Also notified patient per Dr. Rogers Blocker that clotrimazole rx had been sent to pharmacy.  She verbalized understanding.

## 2019-06-22 ENCOUNTER — Encounter: Payer: Self-pay | Admitting: Family Medicine

## 2019-06-22 ENCOUNTER — Other Ambulatory Visit (INDEPENDENT_AMBULATORY_CARE_PROVIDER_SITE_OTHER): Payer: Medicare Other

## 2019-06-22 DIAGNOSIS — R3129 Other microscopic hematuria: Secondary | ICD-10-CM | POA: Diagnosis not present

## 2019-06-22 LAB — URINALYSIS, ROUTINE W REFLEX MICROSCOPIC
Bilirubin Urine: NEGATIVE
Hgb urine dipstick: NEGATIVE
Ketones, ur: NEGATIVE
Leukocytes,Ua: NEGATIVE
Nitrite: NEGATIVE
Specific Gravity, Urine: 1.015 (ref 1.000–1.030)
Total Protein, Urine: NEGATIVE
Urine Glucose: NEGATIVE
Urobilinogen, UA: 0.2 (ref 0.0–1.0)
pH: 8.5 — AB (ref 5.0–8.0)

## 2019-06-22 NOTE — Telephone Encounter (Signed)
Called and spoke with patient. Discussed results, pending results, and symptoms. Will await results of urine culture.

## 2019-06-23 LAB — URINE CULTURE
MICRO NUMBER:: 866583
SPECIMEN QUALITY:: ADEQUATE

## 2019-06-25 ENCOUNTER — Encounter: Payer: Self-pay | Admitting: Family Medicine

## 2019-06-26 ENCOUNTER — Encounter: Payer: Self-pay | Admitting: Family Medicine

## 2019-06-26 MED ORDER — CIPROFLOXACIN HCL 500 MG PO TABS: 500.0000 mg | ORAL_TABLET | Freq: Two times a day (BID) | ORAL | 0 refills | Status: DC

## 2019-08-01 ENCOUNTER — Encounter: Payer: Self-pay | Admitting: Family Medicine

## 2019-08-01 ENCOUNTER — Other Ambulatory Visit: Payer: Self-pay

## 2019-08-01 ENCOUNTER — Ambulatory Visit (INDEPENDENT_AMBULATORY_CARE_PROVIDER_SITE_OTHER): Payer: Medicare Other

## 2019-08-01 DIAGNOSIS — Z23 Encounter for immunization: Secondary | ICD-10-CM | POA: Diagnosis not present

## 2019-08-07 ENCOUNTER — Telehealth: Payer: Self-pay | Admitting: Physical Therapy

## 2019-08-07 NOTE — Telephone Encounter (Signed)
Pt returning phone call  Please advise

## 2019-08-07 NOTE — Telephone Encounter (Signed)
Left message on voicemail to call office.  

## 2019-08-07 NOTE — Telephone Encounter (Signed)
Copied from [place] [phone]. Topic: General - Other >> Aug 07, 2019 10:55 AM Rainey Pines A wrote: Patient would like a callback in regards to having lab work ordered for her. Please advise.

## 2019-08-07 NOTE — Telephone Encounter (Signed)
See note

## 2019-08-09 NOTE — Telephone Encounter (Signed)
Called patient answered all questions aware we will order any lab test that are needed at virtual visit and make lab app at this time.

## 2019-08-28 ENCOUNTER — Encounter: Payer: Self-pay | Admitting: Family Medicine

## 2019-08-28 ENCOUNTER — Ambulatory Visit (INDEPENDENT_AMBULATORY_CARE_PROVIDER_SITE_OTHER): Payer: Medicare Other | Admitting: Family Medicine

## 2019-08-28 VITALS — BP 126/79 | HR 83 | Temp 98.0°F | Wt 217.0 lb

## 2019-08-28 DIAGNOSIS — F325 Major depressive disorder, single episode, in full remission: Secondary | ICD-10-CM

## 2019-08-28 DIAGNOSIS — M1A9XX0 Chronic gout, unspecified, without tophus (tophi): Secondary | ICD-10-CM | POA: Diagnosis not present

## 2019-08-28 DIAGNOSIS — E785 Hyperlipidemia, unspecified: Secondary | ICD-10-CM | POA: Diagnosis not present

## 2019-08-28 DIAGNOSIS — I10 Essential (primary) hypertension: Secondary | ICD-10-CM | POA: Diagnosis not present

## 2019-08-28 DIAGNOSIS — R739 Hyperglycemia, unspecified: Secondary | ICD-10-CM | POA: Diagnosis not present

## 2019-08-28 NOTE — Assessment & Plan Note (Signed)
Patient is thankful for no gout flares-we will get uric acid level but would be unlikely to start medication since she is not having flares since stopping hydrochlorothiazide

## 2019-08-28 NOTE — Patient Instructions (Signed)
Health Maintenance Due  Topic Date Due  . TETANUS/TDAP -will wait until OV 08/11/2010   Depression screen [facility] 2/9 01/11/2019 05/16/2018 05/16/2018  Decreased Interest 0 0 0  Down, Depressed, Hopeless 0 0 0  PHQ - 2 Score 0 0 0  Altered sleeping 0 0 -  Tired, decreased energy 0 0 -  Change in appetite 0 0 -  Feeling bad or failure about yourself  0 0 -  Trouble concentrating 0 0 -  Moving slowly or fidgety/restless 0 0 -  Suicidal thoughts 0 0 -  PHQ-9 Score 0 0 -  Difficult doing work/chores Not difficult at all - -

## 2019-08-28 NOTE — Progress Notes (Signed)
Phone [phone] Virtual visit via Video note   Subjective:  Chief complaint: Chief Complaint  Patient presents with  . Follow-up    This visit type was conducted due to national recommendations for restrictions regarding the COVID-19 Pandemic (e.g. social distancing).  This format is felt to be most appropriate for this patient at this time balancing risks to patient and risks to population by having him in for in person visit.  No physical exam was performed (except for noted visual exam or audio findings with Telehealth visits).    Our team/I connected with [name] at  4:20 PM EST by a video enabled telemedicine application (doxy.me or caregility through epic) and verified that I am speaking with the correct person using two identifiers.  Location patient: Home[place] Location provider: [facility], office Persons participating in the virtual visit:  patient  Our team/I discussed the limitations of evaluation and management by telemedicine and the availability of in person appointments. In light of current covid-19 pandemic, patient also understands that we are trying to protect them by minimizing in office contact if at all possible.  The patient expressed consent for telemedicine visit and agreed to proceed. Patient understands insurance will be billed.   ROS- No chest pain or shortness of breath. No headache or blurry vision.    Past Medical History-  Patient Active Problem List   Diagnosis Date Noted  . Hx of adenomatous polyp of colon 04/30/2016    Priority: Medium  . Gout 12/28/2014    Priority: Medium  . Hyperlipidemia 09/30/2009    Priority: Medium  . Depression 05/22/2007    Priority: Medium  . Essential hypertension 05/22/2007    Priority: Medium  . Allergic rhinitis 12/28/2014    Priority: Low  . Hyperglycemia 08/31/2013    Priority: Low  . Osteopenia 03/27/2019  . Morbid obesity ([place]) 04/19/2017    Medications- reviewed and updated Current Outpatient  Medications  Medication Sig Dispense Refill  . ALPRAZolam (XANAX) 0.25 MG tablet take 1 tablet by mouth twice a day if needed 40 tablet 0  . amLODipine (NORVASC) 2.5 MG tablet Take 1 tablet (2.5 mg total) by mouth daily. 90 tablet 3  . aspirin 81 MG tablet Take 81 mg by mouth daily.    . Clotrimazole 1 % OINT Apply 1 application topically 2 (two) times daily. 30 g 1  . escitalopram (LEXAPRO) 20 MG tablet Take 1 tablet (20 mg total) by mouth daily. 90 tablet 3  . simvastatin (ZOCOR) 20 MG tablet Take 1 tablet (20 mg total) by mouth daily at 6 PM. 90 tablet 3   No current facility-administered medications for this visit.      Objective:  BP 126/79   Pulse 83   Temp 98 F (36.7 C)   Wt 217 lb (98.4 kg)   LMP  (LMP Unknown)   BMI 41.00 kg/m  self reported vitals Gen: NAD, resting comfortably Lungs: nonlabored, normal respiratory rate  Skin: appears dry, no obvious rash     Assessment and Plan   #social update- trying to be very careful with covid 19 and feels she has missed some social options as a result.   #Possible hematuria-while being treated for UTI-cleared on repeat-no further work-up needed  #hypertension/gout S: compliant with amlodipine 2.5 mg. Luckily no gout flares since making change BP Readings from Last 3 Encounters:  08/28/19 126/79  01/11/19 127/83  10/10/18 124/72  A/P: Good control of hypertension-continue current medication.  Patient is thankful for  no gout flares-we will get uric acid level but would be unlikely to start medication since she is not having flares since stopping hydrochlorothiazide  # Depression S: well controlled on escitalopram 20mg  Depression screen [facility] 2/9 08/28/2019  Decreased Interest 0  Down, Depressed, Hopeless 0  PHQ - 2 Score 0  Altered sleeping 0  Tired, decreased energy 0  Change in appetite 0  Feeling bad or failure about yourself  0  Trouble concentrating 0  Moving slowly or fidgety/restless 0  Suicidal thoughts 0   PHQ-9 Score 0  Difficult doing work/chores Not difficult at all  A/P: Full remission of depression-continue escitalopram.  #hyperlipidemia S: compliant with simvastatin 20 mg Lab Results  Component Value Date   CHOL 169 06/14/2017   HDL 52.60 06/14/2017   LDLCALC 96 06/14/2017   LDLDIRECT 104.0 05/16/2018   TRIG 104.0 06/14/2017   CHOLHDL 3 06/14/2017   A/P: Patient overdue for lipid panel-she agrees to come by for labs.  Hopefully controlled-likely work on weight loss to try to get LDL under 70 instead of increasing medicine especially with prediabetes   # Hyperglycemia/insulin resistance/prediabetes/morbid obesity S: Exercise and diet-  not as active with covid 19. Weight largely stable at least. Does not think stress eating has been an issue for her Lab Results  Component Value Date   HGBA1C 5.8 05/16/2018   HGBA1C 5.8 06/14/2017   HGBA1C 5.7 02/19/2016  A/P: Hopefully stable-update A1c with labs. Encouraged need for healthy eating, regular exercise, weight loss.    Recommended follow up: 6 month follow up hopefully, can   Lab/Order associations: not sure if she will fast   ICD-10-CM   1. Hyperlipidemia, unspecified hyperlipidemia type  E78.5 CBC with Differential future    CMET future    Lipid future  2. Essential hypertension  I10 CBC with Differential future    CMET future    Lipid future  3. Major depressive disorder with single episode, in full remission ([place])  F32.5   4. Chronic gout without tophus, unspecified cause, unspecified site  M1A.[id number] Future Uric Acid  5. Hyperglycemia  R73.9 A1c future  6. Morbid obesity ([place])  E66.01    Return precautions advised.  Garret Reddish, MD

## 2019-08-30 ENCOUNTER — Other Ambulatory Visit: Payer: Self-pay

## 2019-08-31 ENCOUNTER — Other Ambulatory Visit (INDEPENDENT_AMBULATORY_CARE_PROVIDER_SITE_OTHER): Payer: Medicare Other

## 2019-08-31 DIAGNOSIS — E785 Hyperlipidemia, unspecified: Secondary | ICD-10-CM

## 2019-08-31 DIAGNOSIS — R739 Hyperglycemia, unspecified: Secondary | ICD-10-CM

## 2019-08-31 DIAGNOSIS — M1A9XX0 Chronic gout, unspecified, without tophus (tophi): Secondary | ICD-10-CM | POA: Diagnosis not present

## 2019-08-31 DIAGNOSIS — I10 Essential (primary) hypertension: Secondary | ICD-10-CM | POA: Diagnosis not present

## 2019-08-31 LAB — COMPREHENSIVE METABOLIC PANEL
ALT: 12 U/L (ref 0–35)
AST: 15 U/L (ref 0–37)
Albumin: 4.1 g/dL (ref 3.5–5.2)
Alkaline Phosphatase: 67 U/L (ref 39–117)
BUN: 9 mg/dL (ref 6–23)
CO2: 29 mEq/L (ref 19–32)
Calcium: 9.6 mg/dL (ref 8.4–10.5)
Chloride: 101 mEq/L (ref 96–112)
Creatinine, Ser: 0.59 mg/dL (ref 0.40–1.20)
GFR: 100 mL/min (ref >60.00)
Glucose, Bld: 96 mg/dL (ref 70–99)
Potassium: 3.9 mEq/L (ref 3.5–5.1)
Sodium: 138 mEq/L (ref 135–145)
Total Bilirubin: 0.7 mg/dL (ref 0.2–1.2)
Total Protein: 7.2 g/dL (ref 6.0–8.3)

## 2019-08-31 LAB — CBC WITH DIFFERENTIAL/PLATELET
Basophils Absolute: 0 10*3/uL (ref 0.0–0.1)
Basophils Relative: 0.3 % (ref 0.0–3.0)
Eosinophils Absolute: 0.1 10*3/uL (ref 0.0–0.7)
Eosinophils Relative: 1.2 % (ref 0.0–5.0)
HCT: 39.3 % (ref 36.0–46.0)
Hemoglobin: 13.3 g/dL (ref 12.0–15.0)
Lymphocytes Relative: 30.4 % (ref 12.0–46.0)
Lymphs Abs: 3 10*3/uL (ref 0.7–4.0)
MCHC: 33.7 g/dL (ref 30.0–36.0)
MCV: 88.1 fl (ref 78.0–100.0)
Monocytes Absolute: 1.1 10*3/uL — ABNORMAL HIGH (ref 0.1–1.0)
Monocytes Relative: 11 % (ref 3.0–12.0)
Neutro Abs: 5.7 10*3/uL (ref 1.4–7.7)
Neutrophils Relative %: 57.1 % (ref 43.0–77.0)
Platelets: 308 10*3/uL (ref 150.0–400.0)
RBC: 4.47 Mil/uL (ref 3.87–5.11)
RDW: 13.2 % (ref 11.5–15.5)
WBC: 9.9 10*3/uL (ref 4.0–10.5)

## 2019-08-31 LAB — LIPID PANEL
Cholesterol: 158 mg/dL (ref 0–200)
HDL: 55.3 mg/dL (ref >39.00)
LDL Cholesterol: 81 mg/dL (ref 0–99)
NonHDL: 103.03
Total CHOL/HDL Ratio: 3
Triglycerides: 108 mg/dL (ref 0.0–149.0)
VLDL: 21.6 mg/dL (ref 0.0–40.0)

## 2019-08-31 LAB — HEMOGLOBIN A1C: Hgb A1c MFr Bld: 5.6 % (ref 4.6–6.5)

## 2019-08-31 LAB — URIC ACID: Uric Acid, Serum: 6.4 mg/dL (ref 2.4–7.0)

## 2019-09-30 ENCOUNTER — Encounter: Payer: Self-pay | Admitting: Family Medicine

## 2019-10-01 MED ORDER — ALPRAZOLAM 0.25 MG PO TABS: ORAL_TABLET | ORAL | 0 refills | Status: DC

## 2019-10-17 ENCOUNTER — Encounter: Payer: Self-pay | Admitting: Family Medicine

## 2019-10-31 ENCOUNTER — Ambulatory Visit: Payer: Medicare Other | Attending: Internal Medicine

## 2019-10-31 DIAGNOSIS — Z23 Encounter for immunization: Secondary | ICD-10-CM | POA: Insufficient documentation

## 2019-10-31 NOTE — Progress Notes (Signed)
   Covid-19 Vaccination Clinic  Name:  Cynthia Berry    MRN: [id number] DOB: Mar 05, 1946  10/31/2019  [name] was observed post Covid-19 immunization for 30 minutes based on pre-vaccination screening without incidence. She was provided with Vaccine Information Sheet and instruction to access the V-Safe system.   [name] was instructed to call 911 with any severe reactions post vaccine: Marland Kitchen Difficulty breathing  . Swelling of your face and throat  . A fast heartbeat  . A bad rash all over your body  . Dizziness and weakness    Immunizations Administered    Name Date Dose VIS Date Route   Pfizer COVID-19 Vaccine 10/31/2019  2:15 PM 0.3 mL 09/21/2019 Intramuscular   Manufacturer: [place]   Lot: [id number]   [place]: [id number]

## 2019-11-02 ENCOUNTER — Other Ambulatory Visit: Payer: Self-pay

## 2019-11-02 ENCOUNTER — Ambulatory Visit (INDEPENDENT_AMBULATORY_CARE_PROVIDER_SITE_OTHER): Payer: Medicare Other

## 2019-11-02 DIAGNOSIS — Z0000 Encounter for general adult medical examination without abnormal findings: Secondary | ICD-10-CM

## 2019-11-02 NOTE — Progress Notes (Signed)
This visit is being conducted via phone call due to the COVID-19 pandemic. This patient has given me verbal consent via phone to conduct this visit, patient states they are participating from their home address. Some vital signs may be absent or patient reported.   Patient identification: identified by name, DOB, and current address.  Location provider: [facility], Office Persons participating in the virtual visit: Cynthia Berry, patient, and Cynthia Berry   Subjective:   Cynthia Berry is a 74 y.o. female who presents for Medicare Annual (Subsequent) preventive examination.  Review of Systems:   Cardiac Risk Factors include: advanced age (>31men, >26 women);hypertension;dyslipidemia    Objective:     Vitals: LMP  (LMP Unknown)   There is no height or weight on file to calculate BMI.  Advanced Directives 11/02/2019 02/19/2016  Does Patient Have a Medical Advance Directive? Yes Yes  Type of Advance Directive Living will;Healthcare Power of Attorney -  Does patient want to make changes to medical advance directive? No - Patient declined -  Copy of [place] in Chart? No - copy requested No - copy requested    Tobacco Social History   Tobacco Use  Smoking Status Never Smoker  Smokeless Tobacco Never Used     Counseling given: Not Answered   Clinical Intake:  Pre-visit preparation completed: Yes  Pain : No/denies pain  Diabetes: No  How often do you need to have someone help you when you read instructions, pamphlets, or other written materials from your doctor or pharmacy?: 1 - Never  Interpreter Needed?: No  Information entered by :: Cynthia Berry  Past Medical History:  Diagnosis Date  . Anemia   . Anxiety    Pt very anxious/sensitive to certain meds!  . Depression   . Hx of adenomatous polyp of colon 04/30/2016  . Hyperlipidemia   . Hypertension   . Post-operative nausea and vomiting    due to nerves   Past Surgical History:   Procedure Laterality Date  . TONSILLECTOMY     as child   Family History  Problem Relation Age of Onset  . Stroke Mother 62  . Heart disease Father 22       mi, smoker  . Lung cancer Sister        1/2 sister-lung cancer  . Heart disease Sister   . Colon cancer Neg Hx   . Esophageal cancer Neg Hx   . Rectal cancer Neg Hx   . Stomach cancer Neg Hx   . Breast cancer Neg Hx    Social History   Socioeconomic History  . Marital status: Widowed    Spouse name: Not on file  . Number of children: Not on file  . Years of education: Not on file  . Highest education level: Not on file  Occupational History  . Not on file  Tobacco Use  . Smoking status: Never Smoker  . Smokeless tobacco: Never Used  Substance and Sexual Activity  . Alcohol use: Yes    Comment: about 1 drink per month  . Drug use: No  . Sexual activity: Never  Other Topics Concern  . Not on file  Social History Narrative   Widowed 2008. 2 daughters-1 in [place] and 1 in [place]. 2 grandkids      Taught special education until daughters born   Mirian Mo      Hobbies: dancing, cooking and family gatherings.    Social Determinants of Health  Financial Resource Strain:   . Difficulty of Paying Living Expenses: Not on file  Food Insecurity:   . Worried About Charity fundraiser in the Last Year: Not on file  . Ran Out of Food in the Last Year: Not on file  Transportation Needs:   . Lack of Transportation (Medical): Not on file  . Lack of Transportation (Non-Medical): Not on file  Physical Activity:   . Days of Exercise per Week: Not on file  . Minutes of Exercise per Session: Not on file  Stress:   . Feeling of Stress : Not on file  Social Connections:   . Frequency of Communication with Friends and Family: Not on file  . Frequency of Social Gatherings with Friends and Family: Not on file  . Attends Religious Services: Not on file  . Active Member of Clubs or Organizations: Not on file  . Attends  Archivist Meetings: Not on file  . Marital Status: Not on file    Outpatient Encounter Medications as of 11/02/2019  Medication Sig  . ALPRAZolam (XANAX) 0.25 MG tablet take 1 tablet by mouth twice a day if needed  . amLODipine (NORVASC) 2.5 MG tablet Take 1 tablet (2.5 mg total) by mouth daily.  Marland Kitchen aspirin 81 MG tablet Take 81 mg by mouth daily.  . Clotrimazole 1 % OINT Apply 1 application topically 2 (two) times daily.  Marland Kitchen escitalopram (LEXAPRO) 20 MG tablet Take 1 tablet (20 mg total) by mouth daily.  . simvastatin (ZOCOR) 20 MG tablet Take 1 tablet (20 mg total) by mouth daily at 6 PM.   No facility-administered encounter medications on file as of 11/02/2019.    Activities of Daily Living In your present state of health, do you have any difficulty performing the following activities: 11/02/2019 01/11/2019  Hearing? N N  Vision? N N  Difficulty concentrating or making decisions? N N  Walking or climbing stairs? N N  Dressing or bathing? N N  Doing errands, shopping? N N  Preparing Food and eating ? N -  Using the Toilet? N -  In the past six months, have you accidently leaked urine? N -  Do you have problems with loss of bowel control? N -  Managing your Medications? N -  Managing your Finances? N -  Housekeeping or managing your Housekeeping? N -  Some recent data might be hidden    Patient Care Team: Marin Olp, MD as PCP - General (Family Medicine)    Assessment:   This is a routine wellness examination for Cynthia Berry.  Exercise Activities and Dietary recommendations Current Exercise Habits: The patient does not participate in regular exercise at present  Goals   None     Fall Risk Fall Risk  11/02/2019 05/16/2018 04/19/2017 02/19/2016 02/19/2016  Falls in the past year? 0 No No No No  Number falls in past yr: 0 - - - -  Injury with Fall? 0 - - - -  Follow up Falls evaluation completed;Falls prevention discussed;Education provided - - - -   Is the patient's  home free of loose throw rugs in walkways, pet beds, electrical cords, etc?   yes      Grab bars in the bathroom? yes      Handrails on the stairs?   yes      Adequate lighting?   yes  Depression Screen PHQ 2/9 Scores 11/02/2019 08/28/2019 01/11/2019 05/16/2018  PHQ - 2 Score 0 0 0 0  PHQ-  9 Score - 0 0 0     Cognitive Function     6CIT Screen 11/02/2019  What Year? 0 points  What month? 0 points  What time? 0 points  Count back from 20 0 points  Months in reverse 0 points  Repeat phrase 0 points  Total Score 0    Immunization History  Administered Date(s) Administered  . Fluad Quad(high Dose 65+) 08/01/2019  . Influenza Whole 08/13/2009  . Influenza, High Dose Seasonal PF 09/06/2016, 08/05/2017, 08/09/2018  . Influenza-Unspecified 07/11/2013, 08/06/2015  . PFIZER SARS-COV-2 Vaccination 10/31/2019  . Pneumococcal Conjugate-13 12/12/2014  . Pneumococcal Polysaccharide-23 02/04/2016  . Td 08/11/2000    Qualifies for Shingles Vaccine?Discussed and patient will check with pharmacy for coverage.  Patient education handout provided   Screening Tests Health Maintenance  Topic Date Due  . TETANUS/TDAP  08/11/2010  . MAMMOGRAM  03/26/2021  . COLONOSCOPY  05/04/2023  . INFLUENZA VACCINE  Completed  . DEXA SCAN  Completed  . Hepatitis C Screening  Completed  . PNA vac Low Risk Adult  Completed    Cancer Screenings: Lung: Low Dose CT Chest recommended if Age 19-80 years, 30 pack-year currently smoking OR have quit w/in 15years. Patient does not qualify. Breast:  Up to date on Mammogram? Yes   Up to date of Bone Density/Dexa? Yes Colorectal: colonoscopy 05/03/16 repeat in 7 years    Plan:  I have personally reviewed and addressed the Medicare Annual Wellness questionnaire and have noted the following in the patient's chart:  A. Medical and social history B. Use of alcohol, tobacco or illicit drugs  C. Current medications and supplements D. Functional ability and status E.   Nutritional status F.  Physical activity G. Advance directives H. List of other physicians I.  Hospitalizations, surgeries, and ER visits in previous 12 months J.  [place] such as hearing and vision if needed, cognitive and depression L. Referrals, records requested, and appointments- none   In addition, I have reviewed and discussed with patient certain preventive protocols, quality metrics, and best practice recommendations. A written personalized care plan for preventive services as well as general preventive health recommendations were provided to patient.   Signed,  Cynthia George, Berry  Nurse Health Advisor   Nurse Notes: no additional

## 2019-11-02 NOTE — Patient Instructions (Signed)
Cynthia Berry , Thank you for taking time to come for your Medicare Wellness Visit. I appreciate your ongoing commitment to your health goals. Please review the following plan we discussed and let me know if I can assist you in the future.   Screening recommendations/referrals: Colorectal Screening: up to date; last colonoscopy 05/03/16 (repeat in 7 years)  Mammogram: up to date; last 03/27/19 Bone Density: up to date; last 03/27/19  Vision and Dental Exams: Recommended annual ophthalmology exams for early detection of glaucoma and other disorders of the eye Recommended annual dental exams for proper oral hygiene  Vaccinations: Influenza vaccine: completed 08/01/19 Pneumococcal vaccine: up to date; last 02/04/16 Tdap vaccine: recommended; Please call your insurance company to determine your out of pocket expense. You may receive this vaccine at your local pharmacy or Health Dept. Shingles vaccine: Please call your insurance company to determine your out of pocket expense for the Shingrix vaccine. You may receive this vaccine at your local pharmacy. (See attached information)  Advanced directives: Please bring a copy of your POA (Power of Attorney) and/or Living Will to your next appointment.  Goals: Recommend to drink at least 6-8 8oz glasses of water per day and consume a balanced diet rich in fresh fruits and vegetables.    Next appointment: Please schedule your Annual Wellness Visit with your Nurse Health Advisor in one year.  Preventive Care 38 Years and Older, Female Preventive care refers to lifestyle choices and visits with your health care provider that can promote health and wellness. What does preventive care include?  A yearly physical exam. This is also called an annual well check.  Dental exams once or twice a year.  Routine eye exams. Ask your health care provider how often you should have your eyes checked.  Personal lifestyle choices, including:  Daily care of your teeth and  gums.  Regular physical activity.  Eating a healthy diet.  Avoiding tobacco and drug use.  Limiting alcohol use.  Practicing safe sex.  Taking low-dose aspirin every day if recommended by your health care provider.  Taking vitamin and mineral supplements as recommended by your health care provider. What happens during an annual well check? The services and screenings done by your health care provider during your annual well check will depend on your age, overall health, lifestyle risk factors, and family history of disease. Counseling  Your health care provider may ask you questions about your:  Alcohol use.  Tobacco use.  Drug use.  Emotional well-being.  Home and relationship well-being.  Sexual activity.  Eating habits.  History of falls.  Memory and ability to understand (cognition).  Work and work Statistician.  Reproductive health. Screening  You may have the following tests or measurements:  Height, weight, and BMI.  Blood pressure.  Lipid and cholesterol levels. These may be checked every 5 years, or more frequently if you are over 75 years old.  Skin check.  Lung cancer screening. You may have this screening every year starting at age 38 if you have a 30-pack-year history of smoking and currently smoke or have quit within the past 15 years.  Fecal occult blood test (FOBT) of the stool. You may have this test every year starting at age 58.  Flexible sigmoidoscopy or colonoscopy. You may have a sigmoidoscopy every 5 years or a colonoscopy every 10 years starting at age 59.  Hepatitis C blood test.  Hepatitis B blood test.  Sexually transmitted disease (STD) testing.  Diabetes screening. This is  done by checking your blood sugar (glucose) after you have not eaten for a while (fasting). You may have this done every 1-3 years.  Bone density scan. This is done to screen for osteoporosis. You may have this done starting at age 83.  Mammogram. This  may be done every 1-2 years. Talk to your health care provider about how often you should have regular mammograms. Talk with your health care provider about your test results, treatment options, and if necessary, the need for more tests. Vaccines  Your health care provider may recommend certain vaccines, such as:  Influenza vaccine. This is recommended every year.  Tetanus, diphtheria, and acellular pertussis (Tdap, Td) vaccine. You may need a Td booster every 10 years.  Zoster vaccine. You may need this after age 42.  Pneumococcal 13-valent conjugate (PCV13) vaccine. One dose is recommended after age 30.  Pneumococcal polysaccharide (PPSV23) vaccine. One dose is recommended after age 55. Talk to your health care provider about which screenings and vaccines you need and how often you need them. This information is not intended to replace advice given to you by your health care provider. Make sure you discuss any questions you have with your health care provider. Document Released: 10/24/2015 Document Revised: 06/16/2016 Document Reviewed: 07/29/2015 Elsevier Interactive Patient Education  2017 [place] Prevention in the Home Falls can cause injuries. They can happen to people of all ages. There are many things you can do to make your home safe and to help prevent falls. What can I do on the outside of my home?  Regularly fix the edges of walkways and driveways and fix any cracks.  Remove anything that might make you trip as you walk through a door, such as a raised step or threshold.  Trim any bushes or trees on the path to your home.  Use bright outdoor lighting.  Clear any walking paths of anything that might make someone trip, such as rocks or tools.  Regularly check to see if handrails are loose or broken. Make sure that both sides of any steps have handrails.  Any raised decks and porches should have guardrails on the edges.  Have any leaves, snow, or ice cleared  regularly.  Use sand or salt on walking paths during winter.  Clean up any spills in your garage right away. This includes oil or grease spills. What can I do in the bathroom?  Use night lights.  Install grab bars by the toilet and in the tub and shower. Do not use towel bars as grab bars.  Use non-skid mats or decals in the tub or shower.  If you need to sit down in the shower, use a plastic, non-slip stool.  Keep the floor dry. Clean up any water that spills on the floor as soon as it happens.  Remove soap buildup in the tub or shower regularly.  Attach bath mats securely with double-sided non-slip rug tape.  Do not have throw rugs and other things on the floor that can make you trip. What can I do in the bedroom?  Use night lights.  Make sure that you have a light by your bed that is easy to reach.  Do not use any sheets or blankets that are too big for your bed. They should not hang down onto the floor.  Have a firm chair that has side arms. You can use this for support while you get dressed.  Do not have throw rugs and other things  on the floor that can make you trip. What can I do in the kitchen?  Clean up any spills right away.  Avoid walking on wet floors.  Keep items that you use a lot in easy-to-reach places.  If you need to reach something above you, use a strong step stool that has a grab bar.  Keep electrical cords out of the way.  Do not use floor polish or wax that makes floors slippery. If you must use wax, use non-skid floor wax.  Do not have throw rugs and other things on the floor that can make you trip. What can I do with my stairs?  Do not leave any items on the stairs.  Make sure that there are handrails on both sides of the stairs and use them. Fix handrails that are broken or loose. Make sure that handrails are as long as the stairways.  Check any carpeting to make sure that it is firmly attached to the stairs. Fix any carpet that is loose  or worn.  Avoid having throw rugs at the top or bottom of the stairs. If you do have throw rugs, attach them to the floor with carpet tape.  Make sure that you have a light switch at the top of the stairs and the bottom of the stairs. If you do not have them, ask someone to add them for you. What else can I do to help prevent falls?  Wear shoes that:  Do not have high heels.  Have rubber bottoms.  Are comfortable and fit you well.  Are closed at the toe. Do not wear sandals.  If you use a stepladder:  Make sure that it is fully opened. Do not climb a closed stepladder.  Make sure that both sides of the stepladder are locked into place.  Ask someone to hold it for you, if possible.  Clearly mark and make sure that you can see:  Any grab bars or handrails.  First and last steps.  Where the edge of each step is.  Use tools that help you move around (mobility aids) if they are needed. These include:  Canes.  Walkers.  Scooters.  Crutches.  Turn on the lights when you go into a dark area. Replace any light bulbs as soon as they burn out.  Set up your furniture so you have a clear path. Avoid moving your furniture around.  If any of your floors are uneven, fix them.  If there are any pets around you, be aware of where they are.  Review your medicines with your doctor. Some medicines can make you feel dizzy. This can increase your chance of falling. Ask your doctor what other things that you can do to help prevent falls. This information is not intended to replace advice given to you by your health care provider. Make sure you discuss any questions you have with your health care provider. Document Released: 07/24/2009 Document Revised: 03/04/2016 Document Reviewed: 11/01/2014 Elsevier Interactive Patient Education  2017 [employer].

## 2019-11-18 ENCOUNTER — Ambulatory Visit: Payer: Medicare Other | Attending: Internal Medicine

## 2019-11-18 DIAGNOSIS — Z23 Encounter for immunization: Secondary | ICD-10-CM | POA: Insufficient documentation

## 2019-11-18 NOTE — Progress Notes (Signed)
   Covid-19 Vaccination Clinic  Name:  Cynthia Berry    MRN: [id number] DOB: 03/04/1946  11/18/2019  [name] was observed post Covid-19 immunization for 30 minutes based on pre-vaccination screening without incidence. She was provided with Vaccine Information Sheet and instruction to access the V-Safe system.   [name] was instructed to call 911 with any severe reactions post vaccine: Marland Kitchen Difficulty breathing  . Swelling of your face and throat  . A fast heartbeat  . A bad rash all over your body  . Dizziness and weakness    Immunizations Administered    Name Date Dose VIS Date Route   Pfizer COVID-19 Vaccine 11/18/2019  1:19 PM 0.3 mL 09/21/2019 Intramuscular   Manufacturer: [place]   Lot: [id number]   [place]: [id number]

## 2019-11-22 ENCOUNTER — Ambulatory Visit: Payer: Medicare Other

## 2020-01-01 ENCOUNTER — Other Ambulatory Visit: Payer: Self-pay | Admitting: Family Medicine

## 2020-01-19 ENCOUNTER — Other Ambulatory Visit: Payer: Self-pay | Admitting: Family Medicine

## 2020-05-19 ENCOUNTER — Telehealth: Payer: Self-pay | Admitting: Family Medicine

## 2020-05-19 NOTE — Progress Notes (Signed)
°  Chronic Care Management   Outreach Note  05/19/2020 Name: Cynthia Berry MRN: [id number] DOB: 1946-03-05  Referred by: Marin Olp, MD Reason for referral : No chief complaint on file.   An unsuccessful telephone outreach was attempted today. The patient was referred to the pharmacist for assistance with care management and care coordination.   Follow Up Plan:   Earney Hamburg Upstream Scheduler

## 2020-06-10 ENCOUNTER — Telehealth: Payer: Self-pay | Admitting: Family Medicine

## 2020-06-10 NOTE — Progress Notes (Signed)
  Chronic Care Management   Outreach Note  06/10/2020 Name: Cynthia Berry MRN: [id number] DOB: May 31, 1946  Referred by: Marin Olp, MD Reason for referral : No chief complaint on file.   An unsuccessful telephone outreach was attempted today. The patient was referred to the pharmacist for assistance with care management and care coordination.   Follow Up Plan:   Earney Hamburg Upstream Scheduler

## 2020-06-15 ENCOUNTER — Other Ambulatory Visit: Payer: Self-pay | Admitting: Family Medicine

## 2020-06-19 ENCOUNTER — Telehealth: Payer: Self-pay | Admitting: Family Medicine

## 2020-06-19 NOTE — Progress Notes (Signed)
  Chronic Care Management   Note  06/19/2020 Name: Cynthia Berry MRN: [id number] DOB: 04-22-1946  Cynthia Berry is a 74 y.o. year old female who is a primary care patient of Marin Olp, MD. I reached out to [name] by phone today in response to a referral sent by Cynthia Berry PCP, Marin Olp, MD.   Cynthia Berry was given information about Chronic Care Management services today including:  1. CCM service includes personalized support from designated clinical staff supervised by her physician, including individualized plan of care and coordination with other care providers 2. 24/7 contact phone numbers for assistance for urgent and routine care needs. 3. Service will only be billed when office clinical staff spend 20 minutes or more in a month to coordinate care. 4. Only one practitioner may furnish and bill the service in a calendar month. 5. The patient may stop CCM services at any time (effective at the end of the month) by phone call to the office staff.   Patient agreed to services and verbal consent obtained.   Follow up plan:  Earney Hamburg Upstream Scheduler

## 2020-06-24 ENCOUNTER — Other Ambulatory Visit: Payer: Self-pay

## 2020-06-24 ENCOUNTER — Ambulatory Visit: Payer: Medicare Other

## 2020-06-24 DIAGNOSIS — F325 Major depressive disorder, single episode, in full remission: Secondary | ICD-10-CM

## 2020-06-24 DIAGNOSIS — E785 Hyperlipidemia, unspecified: Secondary | ICD-10-CM

## 2020-06-24 DIAGNOSIS — I10 Essential (primary) hypertension: Secondary | ICD-10-CM

## 2020-06-24 NOTE — Patient Instructions (Addendum)
Please review care plan below and call me at [phone] (direct line) with any questions!  Thank you, Edyth Gunnels., Clinical Pharmacist  Goals Addressed            This Visit's Progress   . PharmD Care Plan       CARE PLAN ENTRY (see longitudinal plan of care for additional care plan information)  Current Barriers:  . Chronic Disease Management support, education, and care coordination needs related to Hypertension, Hyperlipidemia, and Depression   Hypertension BP Readings from Last 3 Encounters:  08/28/19 126/79  01/11/19 127/83  10/10/18 124/72   . Pharmacist Clinical Goal(s): o Over the next 180 days, patient will work with PharmD and providers to maintain BP goal <130/80 . Current regimen:  o Amlodipine 2.5 mg once daily . Interventions: o Diet/exercise recommendations . Patient self care activities - Over the next 180 days, patient will: o Check BP once every 1-2 weeks, document, and provide at future appointments o Ensure daily salt intake < 2300 mg/day  Hyperlipidemia Lab Results  Component Value Date/Time   LDLCALC 81 08/31/2019 10:55 AM   LDLDIRECT 104.0 05/16/2018 10:53 AM   . Pharmacist Clinical Goal(s): o Over the next 180 days, patient will work with PharmD and providers to achieve LDL goal < 70 . Current regimen:  o Simvastatin 20 mg once daily . Interventions: o Diet/exercise recommendations  . Patient self care activities - Over the next 180 days, patient will: o Continue current management Depression . Pharmacist Clinical Goal(s) o Over the next 180 days, patient will work with PharmD and providers to minimize depression connect . Current regimen:  o Escitalopram 20 mg once daily  . Interventions: o N/a . Patient self care activities - Over the next 180 days, patient will: o Continue current management  Medication management . Pharmacist Clinical Goal(s): o Over the next 180 days, patient will work with PharmD and providers to maintain  optimal medication adherence . [facility] . Interventions o Comprehensive medication review performed. o Continue current medication management strategy. . Patient self care activities - Over the next 180 days, patient will: o Take medications as prescribed o Report any questions or concerns to PharmD and/or provider(s) Initial goal documentation.      Ms. Screws was given information about Chronic Care Management services today including:  1. CCM service includes personalized support from designated clinical staff supervised by her physician, including individualized plan of care and coordination with other care providers 2. 24/7 contact phone numbers for assistance for urgent and routine care needs. 3. Standard insurance, coinsurance, copays and deductibles apply for chronic care management only during months in which we provide at least 20 minutes of these services. Most insurances cover these services at 100%, however patients may be responsible for any copay, coinsurance and/or deductible if applicable. This service may help you avoid the need for more expensive face-to-face services. 4. Only one practitioner may furnish and bill the service in a calendar month. 5. The patient may stop CCM services at any time (effective at the end of the month) by phone call to the office staff.  Patient agreed to services and verbal consent obtained.   The patient verbalized understanding of instructions provided today and agreed to receive a mailed copy of patient instruction and/or educational materials. Telephone follow up appointment with pharmacy team member scheduled for: See next appointment with "Care Management Staff" under "What's Next" below.   Cynthia Berry, Pharm.D., BCGP Clinical Pharmacist [facility] (  [phone]  High Cholesterol  High cholesterol is a condition in which the blood has high levels of a white, waxy, fat-like substance (cholesterol). The human body  needs small amounts of cholesterol. The liver makes all the cholesterol that the body needs. Extra (excess) cholesterol comes from the food that we eat. Cholesterol is carried from the liver by the blood through the blood vessels. If you have high cholesterol, deposits (plaques) may build up on the walls of your blood vessels (arteries). Plaques make the arteries narrower and stiffer. Cholesterol plaques increase your risk for heart attack and stroke. Work with your health care provider to keep your cholesterol levels in a healthy range. What increases the risk? This condition is more likely to develop in people who:  Eat foods that are high in animal fat (saturated fat) or cholesterol.  Are overweight.  Are not getting enough exercise.  Have a family history of high cholesterol. What are the signs or symptoms? There are no symptoms of this condition. How is this diagnosed? This condition may be diagnosed from the results of a blood test.  If you are older than age 93, your health care provider may check your cholesterol every 4-6 years.  You may be checked more often if you already have high cholesterol or other risk factors for heart disease. The blood test for cholesterol measures:  "Bad" cholesterol (LDL cholesterol). This is the main type of cholesterol that causes heart disease. The desired level for LDL is less than 100.  "Good" cholesterol (HDL cholesterol). This type helps to protect against heart disease by cleaning the arteries and carrying the LDL away. The desired level for HDL is 60 or higher.  Triglycerides. These are fats that the body can store or burn for energy. The desired number for triglycerides is lower than 150.  Total cholesterol. This is a measure of the total amount of cholesterol in your blood, including LDL cholesterol, HDL cholesterol, and triglycerides. A healthy number is less than 200. How is this treated? This condition is treated with diet changes,  lifestyle changes, and medicines. Diet changes  This may include eating more whole grains, fruits, vegetables, nuts, and fish.  This may also include cutting back on red meat and foods that have a lot of added sugar. Lifestyle changes  Changes may include getting at least 40 minutes of aerobic exercise 3 times a week. Aerobic exercises include walking, biking, and swimming. Aerobic exercise along with a healthy diet can help you maintain a healthy weight.  Changes may also include quitting smoking. Medicines  Medicines are usually given if diet and lifestyle changes have failed to reduce your cholesterol to healthy levels.  Your health care provider may prescribe a statin medicine. Statin medicines have been shown to reduce cholesterol, which can reduce the risk of heart disease. Follow these instructions at home: Eating and drinking If told by your health care provider:  Eat chicken (without skin), fish, veal, shellfish, ground [place] breast, and round or loin cuts of red meat.  Do not eat fried foods or fatty meats, such as hot dogs and salami.  Eat plenty of fruits, such as apples.  Eat plenty of vegetables, such as broccoli, potatoes, and carrots.  Eat beans, peas, and lentils.  Eat grains such as barley, rice, couscous, and bulgur wheat.  Eat pasta without cream sauces.  Use skim or nonfat milk, and eat low-fat or nonfat yogurt and cheeses.  Do not eat or drink whole milk, cream,  ice cream, egg yolks, or hard cheeses.  Do not eat stick margarine or tub margarines that contain trans fats (also called partially hydrogenated oils).  Do not eat saturated tropical oils, such as coconut oil and palm oil.  Do not eat cakes, cookies, crackers, or other baked goods that contain trans fats.  General instructions  Exercise as directed by your health care provider. Increase your activity level with activities such as gardening, walking, and taking the stairs.  Take  over-the-counter and prescription medicines only as told by your health care provider.  Do not use any products that contain nicotine or tobacco, such as cigarettes and e-cigarettes. If you need help quitting, ask your health care provider.  Keep all follow-up visits as told by your health care provider. This is important. Contact a health care provider if:  You are struggling to maintain a healthy diet or weight.  You need help to start on an exercise program.  You need help to stop smoking. Get help right away if:  You have chest pain.  You have trouble breathing. This information is not intended to replace advice given to you by your health care provider. Make sure you discuss any questions you have with your health care provider. Document Revised: 09/30/2017 Document Reviewed: 03/27/2016 Elsevier Patient Education  [place] A low-purine eating plan involves making food choices to limit your intake of purine. Purine is a kind of uric acid. Too much uric acid in your blood can cause certain conditions, such as gout and kidney stones. Eating a low-purine diet can help control these conditions. What are tips for following this plan? Reading food labels   Avoid foods with saturated or Trans fat.  Check the ingredient list of grains-based foods, such as bread and cereal, to make sure that they contain whole grains.  Check the ingredient list of sauces or soups to make sure they do not contain meat or fish.  When choosing soft drinks, check the ingredient list to make sure they do not contain high-fructose corn syrup. Shopping  Buy plenty of fresh fruits and vegetables.  Avoid buying canned or fresh fish.  Buy dairy products labeled as low-fat or nonfat.  Avoid buying premade or processed foods. These foods are often high in fat, salt (sodium), and added sugar. Cooking  Use olive oil instead of butter when cooking. Oils like olive oil,  canola oil, and sunflower oil contain healthy fats. Meal planning  Learn which foods do or do not affect you. If you find out that a food tends to cause your gout symptoms to flare up, avoid eating that food. You can enjoy foods that do not cause problems. If you have any questions about a food item, talk with your dietitian or health care provider.  Limit foods high in fat, especially saturated fat. Fat makes it harder for your body to get rid of uric acid.  Choose foods that are lower in fat and are lean sources of protein. General guidelines  Limit alcohol intake to no more than 1 drink a day for nonpregnant women and 2 drinks a day for men. One drink equals 12 oz of beer, 5 oz of wine, or 1 oz of hard liquor. Alcohol can affect the way your body gets rid of uric acid.  Drink plenty of water to keep your urine clear or pale yellow. Fluids can help remove uric acid from your body.  If directed by your health care  provider, take a vitamin C supplement.  Work with your health care provider and dietitian to develop a plan to achieve or maintain a healthy weight. Losing weight can help reduce uric acid in your blood. What foods are recommended? The items listed may not be a complete list. Talk with your dietitian about what dietary choices are best for you. Foods low in purines Foods low in purines do not need to be limited. These include:  All fruits.  All low-purine vegetables, pickles, and olives.  Breads, pasta, rice, cornbread, and popcorn. Cake and other baked goods.  All dairy foods.  Eggs, nuts, and nut butters.  Spices and condiments, such as salt, herbs, and vinegar.  Plant oils, butter, and margarine.  Water, sugar-free soft drinks, tea, coffee, and cocoa.  Vegetable-based soups, broths, sauces, and gravies. Foods moderate in purines Foods moderate in purines should be limited to the amounts listed.   cup of asparagus, cauliflower, spinach, mushrooms, or green  peas, each day.  2/3 cup uncooked oatmeal, each day.   cup dry wheat bran or wheat germ, each day.  2-3 ounces of meat or poultry, each day.  4-6 ounces of shellfish, such as crab, lobster, oysters, or shrimp, each day.  1 cup cooked beans, peas, or lentils, each day.  Soup, broths, or bouillon made from meat or fish. Limit these foods as much as possible. What foods are not recommended? The items listed may not be a complete list. Talk with your dietitian about what dietary choices are best for you. Limit your intake of foods high in purines, including:  Beer and other alcohol.  Meat-based gravy or sauce.  Canned or fresh fish, such as: ? Anchovies, sardines, herring, and tuna. ? Mussels and scallops. ? Codfish, trout, and haddock.  Berniece Salines.  Organ meats, such as: ? Liver or kidney. ? Tripe. ? Sweetbreads (thymus gland or pancreas).  Wild Clinical biochemist.  Yeast or yeast extract supplements.  Drinks sweetened with high-fructose corn syrup. Summary  Eating a low-purine diet can help control conditions caused by too much uric acid in the body, such as gout or kidney stones.  Choose low-purine foods, limit alcohol, and limit foods high in fat.  You will learn over time which foods do or do not affect you. If you find out that a food tends to cause your gout symptoms to flare up, avoid eating that food. This information is not intended to replace advice given to you by your health care provider. Make sure you discuss any questions you have with your health care provider. Document Revised: 09/09/2017 Document Reviewed: 11/10/2016 Elsevier Patient Education  2020 [employer].

## 2020-06-24 NOTE — Progress Notes (Signed)
Chronic Care Management Pharmacy  Name: Cynthia Berry  MRN: [id number] DOB: [birth year]  Chief Complaint/ HPI  Cynthia Berry,  74 y.o., female presents for their Initial CCM visit with the clinical pharmacist via telephone due to COVID-19 Pandemic.  PCP : Marin Olp, MD   Chronic conditions include:  Encounter Diagnoses  Name Primary?  . Major depressive disorder with single episode, in full remission ([place])   . Essential hypertension   . Hyperlipidemia, unspecified hyperlipidemia type     Office Visits:  08/17/2020 (PCP): no gout flares since stopping hctz  Patient Active Problem List   Diagnosis Date Noted  . Osteopenia 03/27/2019  . Morbid obesity ([place]) 04/19/2017  . Hx of adenomatous polyp of colon 04/30/2016  . Allergic rhinitis 12/28/2014  . Gout 12/28/2014  . Hyperglycemia 08/31/2013  . Hyperlipidemia 09/30/2009  . Depression 05/22/2007  . Essential hypertension 05/22/2007   Past Surgical History:  Procedure Laterality Date  . TONSILLECTOMY     as child   Family History  Problem Relation Age of Onset  . Stroke Mother 30  . Heart disease Father 79       mi, smoker  . Lung cancer Sister        1/2 sister-lung cancer  . Heart disease Sister   . Colon cancer Neg Hx   . Esophageal cancer Neg Hx   . Rectal cancer Neg Hx   . Stomach cancer Neg Hx   . Breast cancer Neg Hx    Allergies  Allergen Reactions  . Doxycycline   . Penicillins     REACTION: swelling,rash  . Sulfa Antibiotics     Per pt, no reaction to Sulfa,but you avoid these meds.   Outpatient Encounter Medications as of 06/24/2020  Medication Sig  . ALPRAZolam (XANAX) 0.25 MG tablet take 1 tablet by mouth twice a day if needed  . amLODipine (NORVASC) 2.5 MG tablet TAKE 1 TABLET BY MOUTH EVERY DAY  . aspirin 81 MG tablet Take 81 mg by mouth daily.  Marland Kitchen escitalopram (LEXAPRO) 20 MG tablet TAKE 1 TABLET BY MOUTH EVERY DAY  . simvastatin (ZOCOR) 20 MG tablet TAKE 1 TABLET DAILY AT 6 PM*NEED TO CALL  OUR OFFICE TO SCHEDULE A APPOINTMENT FOR FURTHER REFILLS  . Clotrimazole 1 % OINT Apply 1 application topically 2 (two) times daily.   No facility-administered encounter medications on file as of 06/24/2020.   Patient Care Team    Relationship Specialty Notifications Start End  Marin Olp, MD PCP - General Family Medicine  12/27/14   Madelin Rear, [facility] Pharmacist Pharmacist  06/19/20    Comment: [phone]   Current Diagnosis/Assessment: Goals Addressed            This Visit's Progress   . PharmD Care Plan       CARE PLAN ENTRY (see longitudinal plan of care for additional care plan information)  Current Barriers:  . Chronic Disease Management support, education, and care coordination needs related to Hypertension, Hyperlipidemia, and Depression   Hypertension BP Readings from Last 3 Encounters:  08/28/19 126/79  01/11/19 127/83  10/10/18 124/72   . Pharmacist Clinical Goal(s): o Over the next 180 days, patient will work with PharmD and providers to maintain BP goal <130/80 . Current regimen:  o Amlodipine 2.5 mg once daily . Interventions: o Diet/exercise recommendations . Patient self care activities - Over the next 180 days, patient will: o Check BP once every 1-2 weeks, document, and provide at future  appointments o Ensure daily salt intake < 2300 mg/day  Hyperlipidemia Lab Results  Component Value Date/Time   LDLCALC 81 08/31/2019 10:55 AM   LDLDIRECT 104.0 05/16/2018 10:53 AM   . Pharmacist Clinical Goal(s): o Over the next 180 days, patient will work with PharmD and providers to achieve LDL goal < 70 . Current regimen:  o Simvastatin 20 mg once daily . Interventions: o Diet/exercise recommendations  . Patient self care activities - Over the next 180 days, patient will: o Continue current management Depression . Pharmacist Clinical Goal(s) o Over the next 180 days, patient will work with PharmD and providers to minimize depression connect . Current  regimen:  o Escitalopram 20 mg once daily  . Interventions: o N/a . Patient self care activities - Over the next 180 days, patient will: o Continue current management  Medication management . Pharmacist Clinical Goal(s): o Over the next 180 days, patient will work with PharmD and providers to maintain optimal medication adherence . [facility] . Interventions o Comprehensive medication review performed. o Continue current medication management strategy. . Patient self care activities - Over the next 180 days, patient will: o Take medications as prescribed o Report any questions or concerns to PharmD and/or provider(s) Initial goal documentation.      Hypertension   BP goal <130/80  BP Readings from Last 3 Encounters:  08/28/19 126/79  01/11/19 127/83  10/10/18 124/72   Recent readings at home 117/69, 115/67.  Patient has failed these meds in the past: HCTZ (gout).  Patient checks BP at home several times per month.  Patient is currently controlled on the following medications:  . Amlodipine 2.5 mg once daily   We discussed. Discussed foods high in cholesterol in detail, handout provided. Eats small portions for meals, trying to be more active on stationary bike. Denies dizziness.   Plan  Continue current medications and control with diet and exercise.   Hyperlipidemia   LDL goal < 70  Lipid Panel     Component Value Date/Time   CHOL 158 08/31/2019 1055   TRIG 108.0 08/31/2019 1055   HDL 55.30 08/31/2019 1055   LDLCALC 81 08/31/2019 1055   LDLDIRECT 104.0 05/16/2018 1053    Hepatic Function Latest Ref Rng & Units 08/31/2019 05/16/2018 11/29/2017  Total Protein 6.0 - 8.3 g/dL 7.2 7.2 7.3  Albumin 3.5 - 5.2 g/dL 4.1 4.0 3.8  AST 0 - 37 U/L 15 14 17   ALT 0 - 35 U/L 12 12 15   Alk Phosphatase 39 - 117 U/L 67 56 56  Total Bilirubin 0.2 - 1.2 mg/dL 0.7 0.7 0.6  Bilirubin, Direct 0.0 - 0.3 mg/dL - - -    The 10-year ASCVD risk score Mikey Bussing DC Jr., et al.,  2013) is: 16.1%   Values used to calculate the score:     Age: 15 years     Sex: Female     Is Non-Hispanic African American: No     Diabetic: No     Tobacco smoker: No     Systolic Blood Pressure: 096 mmHg     Is BP treated: Yes     HDL Cholesterol: 55.3 mg/dL     Total Cholesterol: 158 mg/dL   Patient is currently controlled on the following medications:  . Simvastatin 20 mg once daily   We discussed:  diet and exercise extensively. See HTN. Reports to be tolerating medication well.   Plan  Continue current medications.  Depression   PHQ9 SCORE ONLY  06/24/2020 11/02/2019 08/28/2019  PHQ-9 Total Score 0 0 0   Patient is currently controlled on the following medications:  . Escitalopram 20 mg once daily  We discussed: no side effects, feels medication is working well for her and mood/energy has been good.   Plan  Continue current medications.  Gout   Denies any issues with gout after stopping hctz. Patient is currently controlled on the following medications:  . n/a  We discussed:  Low purine diet. Handout provided.  Plan  Continue continue through diet.  Vaccines   Immunization History  Administered Date(s) Administered  . Fluad Quad(high Dose 65+) 08/01/2019  . Influenza Whole 08/13/2009  . Influenza, High Dose Seasonal PF 09/06/2016, 08/05/2017, 08/09/2018  . Influenza-Unspecified 07/11/2013, 08/06/2015  . PFIZER SARS-COV-2 Vaccination 10/31/2019, 11/18/2019  . Pneumococcal Conjugate-13 12/12/2014  . Pneumococcal Polysaccharide-23 02/04/2016  . Td 08/11/2000   Reviewed and discussed patient's vaccination history.  Due for tetanus, shingrix.  Upcoming flu and covid booster discussed.   Plan  Recommended patient receive vaccines as listed above.   Medication Management / Care Coordination   [id number] [place] [facility] [address] [place]Phone: [phone]: [phone] [id number] [place] [place]: [phone]: [phone] Encouraged scheduling for 6 month follow-up with PCP.   Interested in consider other medicare plans. Provided information to contact [place] Plan  Continue current medication management strategy. ___________________________ SDOH (Social Determinants of Health) assessments performed: Yes.  Future Appointments  Date Time Provider [place]3/16/2022  1:00 PM [facility] CCM PHARMACIST [facility]   Visit follow-up:  . [facility] follow-up: 6 month telephone visit.  JaMadelin RearPharm.D., BCGP Clinical Pharmacist [facility] [phone]

## 2020-06-27 ENCOUNTER — Other Ambulatory Visit: Payer: Self-pay | Admitting: *Deleted

## 2020-06-27 DIAGNOSIS — F0391 Unspecified dementia with behavioral disturbance: Secondary | ICD-10-CM

## 2020-07-05 IMAGING — MG DIGITAL SCREENING BILATERAL MAMMOGRAM WITH TOMO AND CAD
6 of 10 series · 6 of 30 positions shown · non-contrast
Comparison: Previous exam(s).

CLINICAL DATA: Screening.

EXAM:
DIGITAL SCREENING BILATERAL MAMMOGRAM WITH TOMO AND CAD

[R MLO synth-2D]
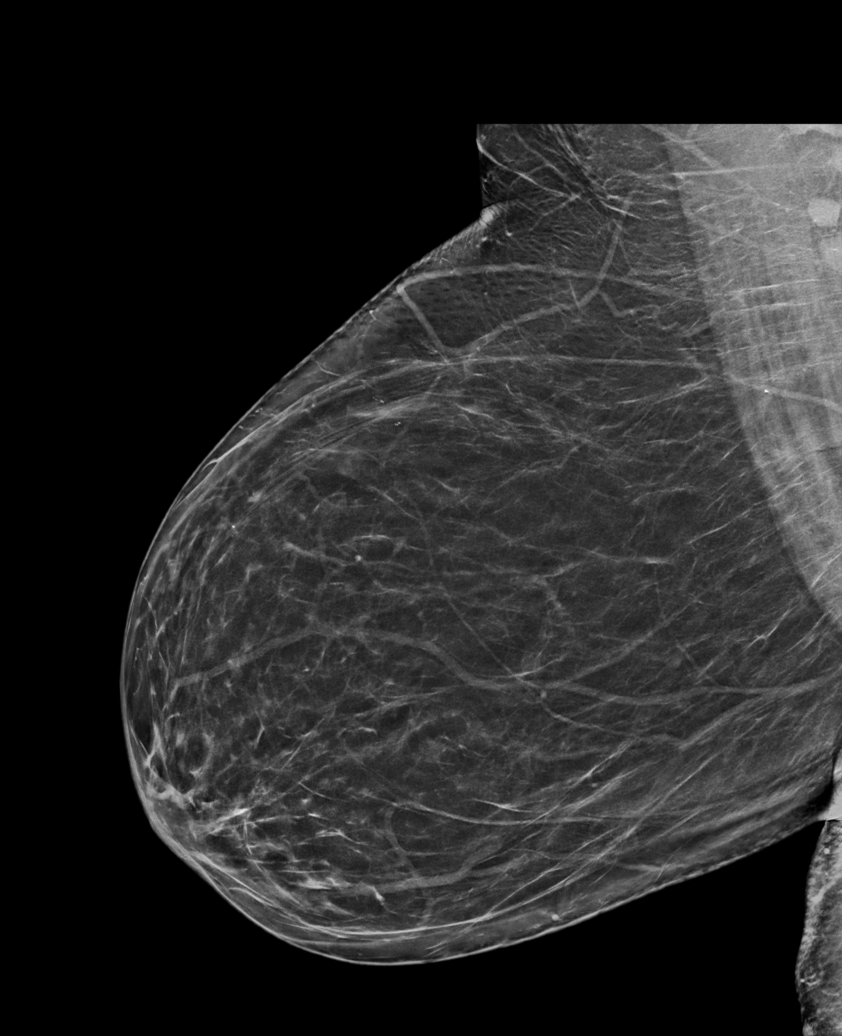

[L CC synth-2D (1 of 2)]
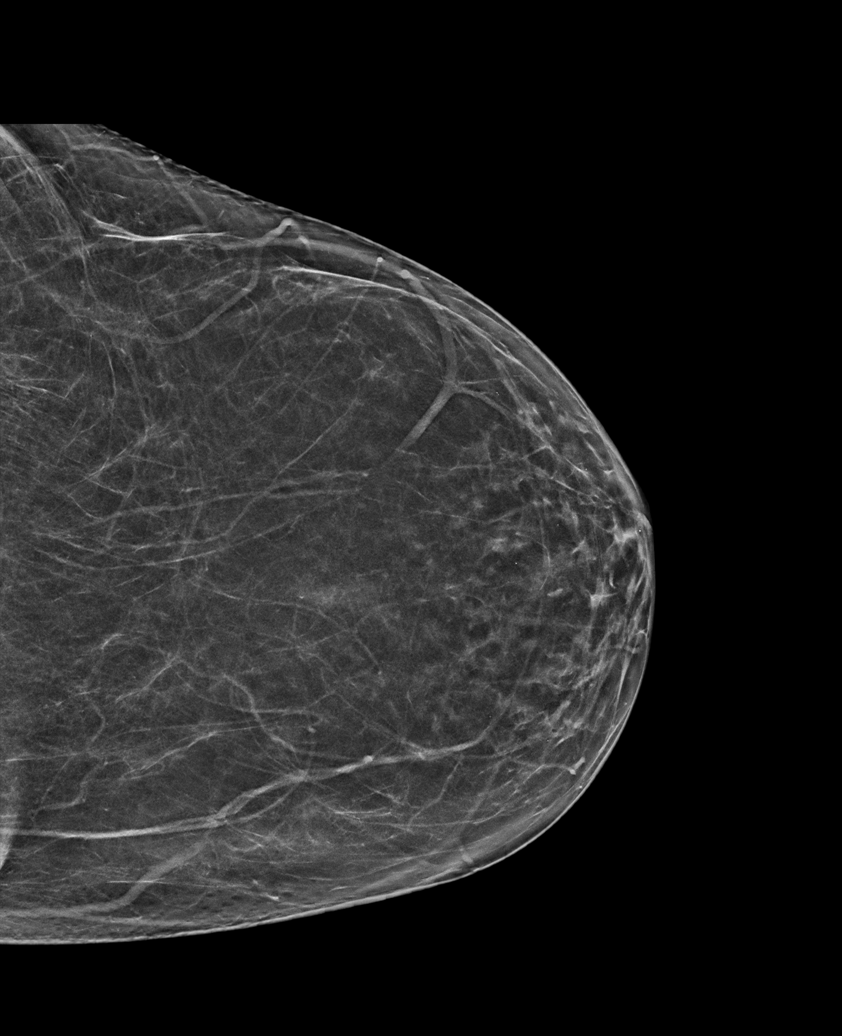

[L MLO synth-2D]
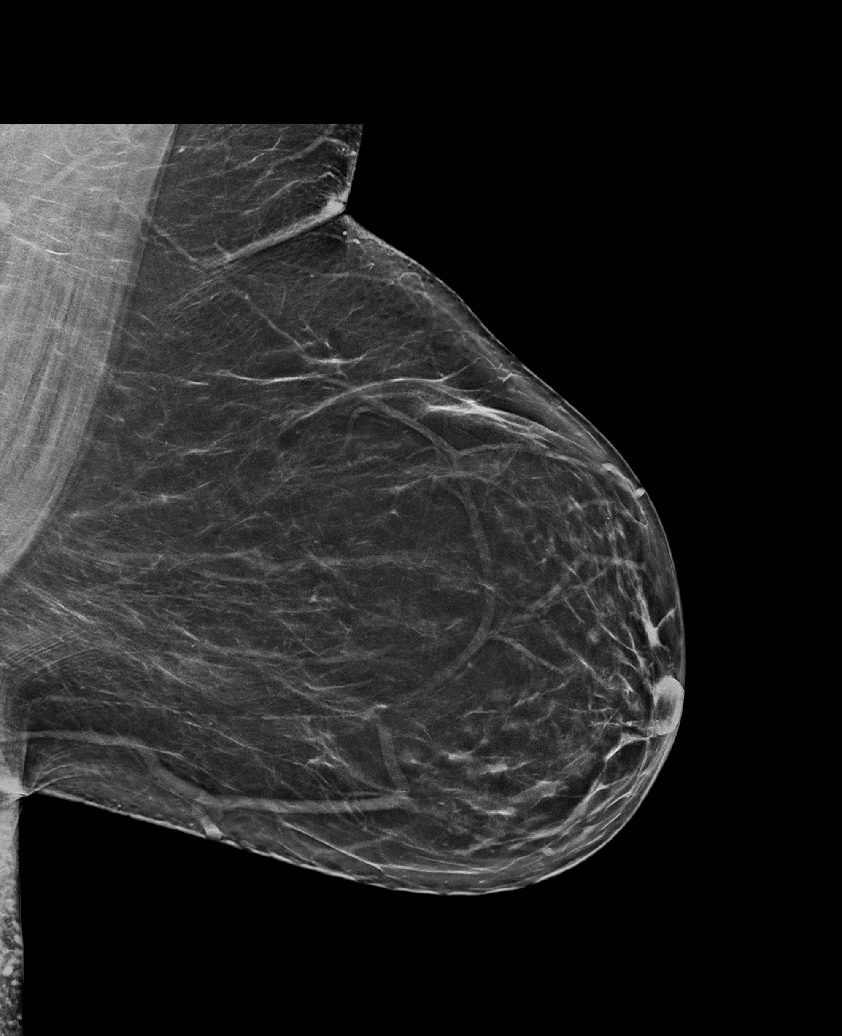

[L CC synth-2D (2 of 2)]
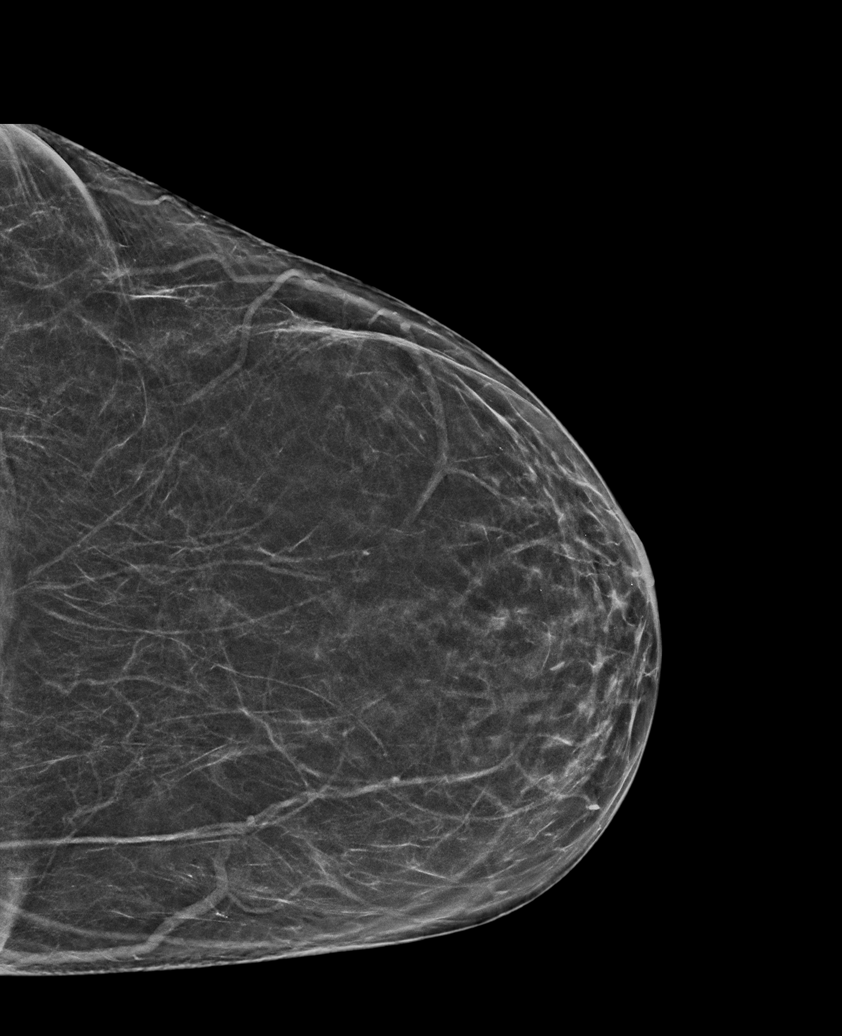

[R CC synth-2D]
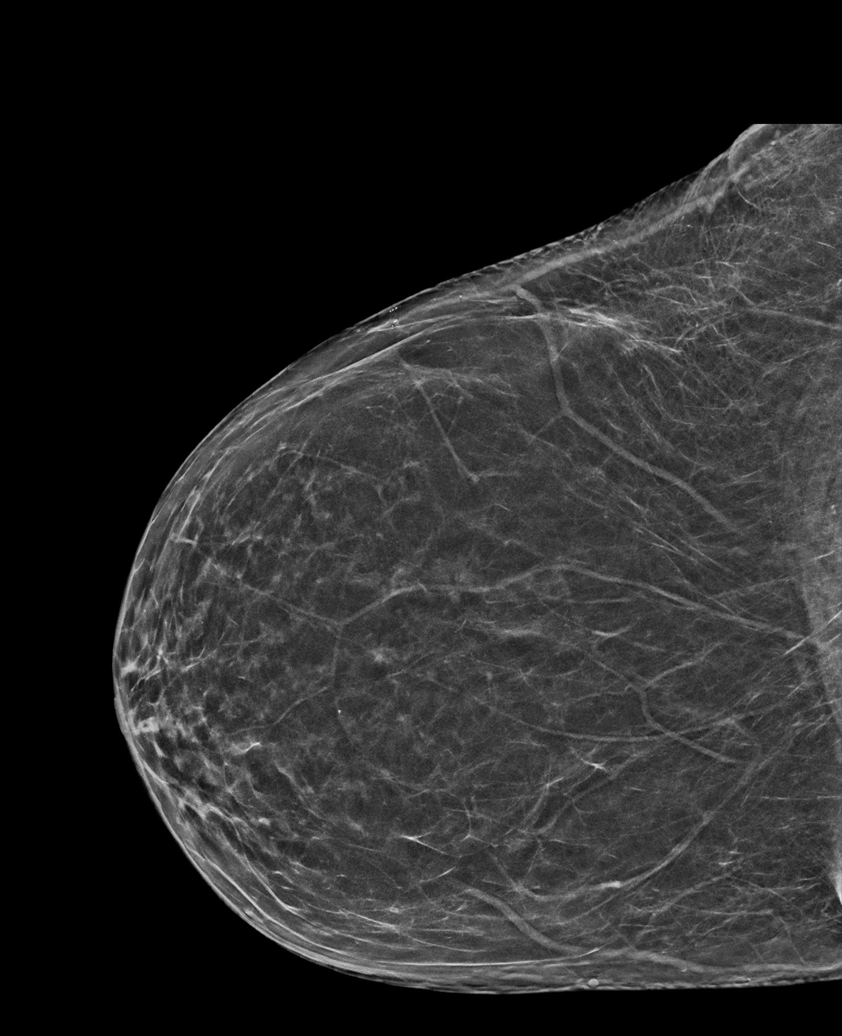

[L MLO tomo · tomo slice 39/76.0]
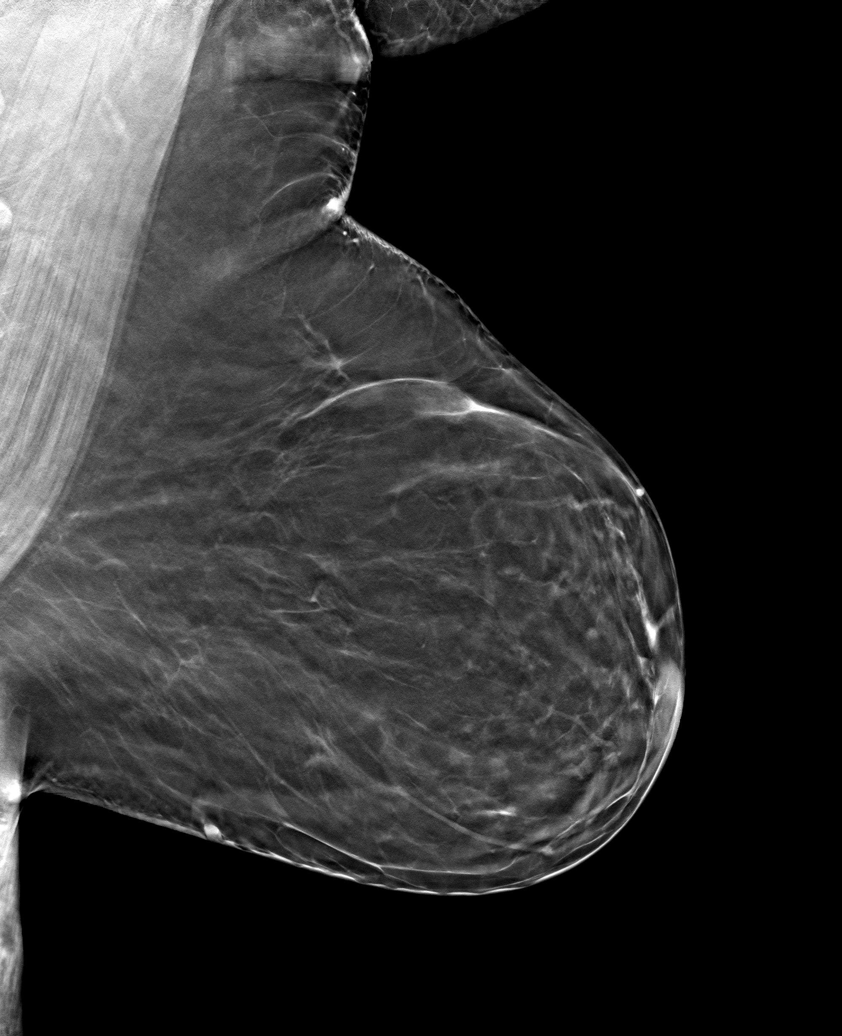

[6 of 30 positions shown; findings below may reference images not displayed]

ACR Breast Density Category b: There are scattered areas of
fibroglandular density.
FINDINGS: There are no findings suspicious for malignancy. Images were
processed with CAD.
IMPRESSION: No mammographic evidence of malignancy. A result letter of this
screening mammogram will be mailed directly to the patient.

RECOMMENDATION:
Screening mammogram in one year. (Code:[id number])

BI-RADS CATEGORY  1: Negative.

## 2020-07-07 ENCOUNTER — Other Ambulatory Visit: Payer: Self-pay | Admitting: Family Medicine

## 2020-07-07 DIAGNOSIS — Z1231 Encounter for screening mammogram for malignant neoplasm of breast: Secondary | ICD-10-CM

## 2020-08-04 ENCOUNTER — Ambulatory Visit: Payer: Medicare Other

## 2020-08-12 ENCOUNTER — Ambulatory Visit: Payer: Medicare Other

## 2020-08-19 ENCOUNTER — Encounter: Payer: Self-pay | Admitting: Family Medicine

## 2020-08-19 ENCOUNTER — Ambulatory Visit (INDEPENDENT_AMBULATORY_CARE_PROVIDER_SITE_OTHER): Payer: Medicare Other

## 2020-08-19 ENCOUNTER — Other Ambulatory Visit: Payer: Self-pay

## 2020-08-19 DIAGNOSIS — Z23 Encounter for immunization: Secondary | ICD-10-CM | POA: Diagnosis not present

## 2020-09-10 ENCOUNTER — Ambulatory Visit
Admission: RE | Admit: 2020-09-10 | Discharge: 2020-09-10 | Disposition: A | Discharge status: Home / Self Care | Payer: Medicare Other | Source: Ambulatory Visit | Attending: Family Medicine | Admitting: Family Medicine

## 2020-09-10 ENCOUNTER — Other Ambulatory Visit: Payer: Self-pay

## 2020-09-10 DIAGNOSIS — Z1231 Encounter for screening mammogram for malignant neoplasm of breast: Secondary | ICD-10-CM

## 2020-09-11 ENCOUNTER — Other Ambulatory Visit: Payer: Self-pay | Admitting: Family Medicine

## 2020-09-16 ENCOUNTER — Telehealth: Payer: Self-pay

## 2020-09-16 NOTE — Progress Notes (Unsigned)
Chronic Care Management Pharmacy Assistant   Name: Cynthia Berry  MRN: [id number] DOB: Apr 19, 1946  Reason for Encounter: Disease State  PCP : Marin Olp, MD  Allergies:   Allergies  Allergen Reactions  . Doxycycline   . Penicillins     REACTION: swelling,rash  . Sulfa Antibiotics     Per pt, no reaction to Sulfa,but you avoid these meds.    Medications: Outpatient Encounter Medications as of 09/16/2020  Medication Sig  . ALPRAZolam (XANAX) 0.25 MG tablet take 1 tablet by mouth twice a day if needed  . amLODipine (NORVASC) 2.5 MG tablet TAKE 1 TABLET BY MOUTH EVERY DAY  . aspirin 81 MG tablet Take 81 mg by mouth daily.  . Clotrimazole 1 % OINT Apply 1 application topically 2 (two) times daily.  Marland Kitchen escitalopram (LEXAPRO) 20 MG tablet TAKE 1 TABLET BY MOUTH EVERY DAY  . simvastatin (ZOCOR) 20 MG tablet TAKE 1 TABLET DAILY AT 6 PM*NEED TO CALL OUR OFFICE TO SCHEDULE A APPOINTMENT FOR FURTHER REFILLS   No facility-administered encounter medications on file as of 09/16/2020.    Current Diagnosis: Patient Active Problem List   Diagnosis Date Noted  . Osteopenia 03/27/2019  . Morbid obesity ([place]) 04/19/2017  . Hx of adenomatous polyp of colon 04/30/2016  . Allergic rhinitis 12/28/2014  . Gout 12/28/2014  . Hyperglycemia 08/31/2013  . Hyperlipidemia 09/30/2009  . Depression 05/22/2007  . Essential hypertension 05/22/2007    Reviewed chart prior to disease state call. Spoke with patient regarding BP  Recent Office Vitals: BP Readings from Last 3 Encounters:  08/28/19 126/79  01/11/19 127/83  10/10/18 124/72   Pulse Readings from Last 3 Encounters:  08/28/19 83  10/10/18 (!) 101  07/10/18 97    Wt Readings from Last 3 Encounters:  08/28/19 217 lb (98.4 kg)  06/21/19 218 lb (98.9 kg)  01/11/19 218 lb (98.9 kg)     Kidney Function Lab Results  Component Value Date/Time   CREATININE 0.59 08/31/2019 10:55 AM   CREATININE 0.72 05/16/2018 10:53 AM   GFR  100.00 08/31/2019 10:55 AM   GFRNONAA 107.40 09/30/2009 11:56 AM   GFRAA 162 05/24/2007 03:33 PM    BMP Latest Ref Rng & Units 08/31/2019 05/16/2018 11/29/2017  Glucose 70 - 99 mg/dL 96 118(H) 100(H)  BUN 6 - 23 mg/dL 9 13 9   Creatinine 0.40 - 1.20 mg/dL 0.59 0.72 0.57  Sodium 135 - 145 mEq/L 138 138 139  Potassium 3.5 - 5.1 mEq/L 3.9 4.6 3.6  Chloride 96 - 112 mEq/L 101 100 99  CO2 19 - 32 mEq/L 29 30 31   Calcium 8.4 - 10.5 mg/dL 9.6 10.1 9.7    . Current antihypertensive regimen:  amLODipine (NORVASC) 2.5 MG tablet . How often are you checking your Blood Pressure? {CHL HP BP Monitoring Frequency:[phone]} . Current home BP readings: *** . What recent interventions/DTPs have been made by any provider to improve Blood Pressure control since last CPP Visit: N/A . Any recent hospitalizations or ED visits since last visit with CPP? No . What diet changes have been made to improve Blood Pressure Control?  o *** . What exercise is being done to improve your Blood Pressure Control?  o ***  Adherence Review:  Is the patient currently on ACE/ARB medication? No Does the patient have >5 day gap between last estimated fill dates? CPP to review   Luc[place] ,[place] Pharmacist Assistant [phone]  Follow-Up:  Pharmacist  Review

## 2020-10-11 DIAGNOSIS — U071 COVID-19: Secondary | ICD-10-CM

## 2020-10-11 HISTORY — DX: COVID-19: U07.1

## 2020-10-22 ENCOUNTER — Encounter: Payer: Self-pay | Admitting: Family Medicine

## 2020-10-23 ENCOUNTER — Telehealth: Payer: Self-pay

## 2020-10-23 DIAGNOSIS — Z1152 Encounter for screening for COVID-19: Secondary | ICD-10-CM | POA: Diagnosis not present

## 2020-10-23 NOTE — Patient Instructions (Addendum)
Health Maintenance Due  Topic Date Due  . TETANUS/TDAP Cheaper to receive at your local pharmacy.  08/11/2010  . COVID-19 Vaccine (3 - Booster for [employer] series) Has not received her booster shot.  05/17/2020   Depression screen PHQ 2/9 06/24/2020 11/02/2019 08/28/2019  Decreased Interest 0 0 0  Down, Depressed, Hopeless 0 0 0  PHQ - 2 Score 0 0 0  Altered sleeping 0 - 0  Tired, decreased energy 0 - 0  Change in appetite 0 - 0  Feeling bad or failure about yourself  0 - 0  Trouble concentrating 0 - 0  Moving slowly or fidgety/restless 0 - 0  Suicidal thoughts 0 - 0  PHQ-9 Score 0 - 0  Difficult doing work/chores - - Not difficult at all

## 2020-10-23 NOTE — Progress Notes (Signed)
Phone [phone] Virtual visit via Video note   Subjective:  Chief complaint: Chief Complaint  Patient presents with  . Cough and Congestion   This visit type was conducted due to national recommendations for restrictions regarding the COVID-19 Pandemic (e.g. social distancing).  This format is felt to be most appropriate for this patient at this time balancing risks to patient and risks to population by having him in for in person visit.  No physical exam was performed (except for noted visual exam or audio findings with Telehealth visits).    Our team/I connected with [name] at  1:00 PM EST by a video enabled telemedicine application (doxy.me or caregility through epic) and verified that I am speaking with the correct person using two identifiers.  Location patient: [place] Location provider: [facility], office Persons participating in the virtual visit:  patient  Our team/I discussed the limitations of evaluation and management by telemedicine and the availability of in person appointments. In light of current covid-19 pandemic, patient also understands that we are trying to protect them by minimizing in office contact if at all possible.  The patient expressed consent for telemedicine visit and agreed to proceed. Patient understands insurance will be billed.   Past Medical History-  Patient Active Problem List   Diagnosis Date Noted  . Hx of adenomatous polyp of colon 04/30/2016    Priority: Medium  . Gout 12/28/2014    Priority: Medium  . Hyperlipidemia 09/30/2009    Priority: Medium  . Depression 05/22/2007    Priority: Medium  . Essential hypertension 05/22/2007    Priority: Medium  . Allergic rhinitis 12/28/2014    Priority: Low  . Hyperglycemia 08/31/2013    Priority: Low  . Osteopenia 03/27/2019  . Morbid obesity ([place]) 04/19/2017    Medications- reviewed and updated Current Outpatient Medications  Medication Sig Dispense Refill  . ALPRAZolam (XANAX) 0.25  MG tablet take 1 tablet by mouth twice a day if needed 40 tablet 0  . amLODipine (NORVASC) 2.5 MG tablet TAKE 1 TABLET BY MOUTH EVERY DAY 90 tablet 3  . aspirin 81 MG tablet Take 81 mg by mouth daily.    Marland Kitchen azithromycin (ZITHROMAX) 250 MG tablet Take 2 tabs on day 1, then 1 tab daily until finished 6 tablet 0  . benzonatate (TESSALON) 100 MG capsule Take 1 capsule (100 mg total) by mouth 2 (two) times daily as needed for cough. 20 capsule 0  . escitalopram (LEXAPRO) 20 MG tablet TAKE 1 TABLET BY MOUTH EVERY DAY 90 tablet 3  . simvastatin (ZOCOR) 20 MG tablet TAKE 1 TABLET DAILY AT 6 PM*NEED TO CALL OUR OFFICE TO SCHEDULE A APPOINTMENT FOR FURTHER REFILLS 90 tablet 0  . Clotrimazole 1 % OINT Apply 1 application topically 2 (two) times daily. (Patient not taking: Reported on 10/24/2020) 30 g 1   No current facility-administered medications for this visit.     Objective:  Temp 100 F (37.8 C) (Oral)   LMP  (LMP Unknown)  self reported vitals Gen: NAD, resting comfortably Lungs: nonlabored, normal respiratory rate  Skin: appears dry, no obvious rash     Assessment and Plan   Cough/Congestion S: Patient mentioned that she started to have a headache and fever  And chills Tuesday the 11th of january. The next day was around 101-102 and has been stable since that time around 100-101. She has taken otc Tylenol and Ibuprofen- helps with body aches and temperature. She started taking mucinex- helping her cough  some things up. Some nasal congestion noted and sinus pressure persistent with green yellow discharge. Also having some headaches   When she coughs she brings up mucus- unknown color. She is doing her best to stay hydrated. Upper dental pressure as well.   Tested for covid 19 at [facility] near [place]. Was told would be told within 24-48 hours. No known covid contacts. They did not test for the flu.   Had original 2 vaccines but no booster with pfizer.  A/P: Patient with symptoms  concerning for potential covid 19 vs. Flu (though even if has flu would be too late for effect of tamiflu) -Patient also with purulent discharge from her nose as well as significant sinus pressure/dental pain and temperature above 100.5 for 3 consecutive days concerning for potential bacterial sinusitis-she technically qualifies for treatment- azithromycin was sent in due to her allergies over Augmentin or doxycycline.  I told her I would like for her to wait until she gets results back from her COVID test first if possible because COVID could certainly explain the fever alternatively-we still discussed if she has symptoms for over 10 days or double sickening in regards to sinus symptoms she can take medication -Patient also requests cough suppressant-Tessalon was sent in Therefore: - testing options discussed with patient- thankfully tested on thursday - recommended patient watch closely for shortness of breath or confusion or worsening symptoms and if those occur he should contact us immediately  -recommended patient consider purchasing pulse oximeter and if levels 94% or below persistently- seek care at the hospital - she should be getting one int he mail today -recommended self isolation until negative test  at minimum .  - for self isolation if covid 19 test positive would need to be at least 10 days since first symptom AND at least 24 hours fever free without fever reducing medications AND improvement in respiratory symptoms  To end self isolation. Discussed newer guidelines of 5 days but prefer 10 if possible - if positive should inform her close contacts and she will also inform me through MyChart  Recommended follow up: As needed for acute concerns Future Appointments  Date Time Provider [place]  12/24/2020  1:00 PM [facility] CCM PHARMACIST [facility]    Lab/Order associations:   ICD-10-CM   1. Fever, unspecified fever cause  R50.9   2. Sinus pressure  J34.89   3. Cough  R05.9      Meds ordered this encounter  Medications  . azithromycin (ZITHROMAX) 250 MG tablet    Sig: Take 2 tabs on day 1, then 1 tab daily until finished    Dispense:  6 tablet    Refill:  0  . benzonatate (TESSALON) 100 MG capsule    Sig: Take 1 capsule (100 mg total) by mouth 2 (two) times daily as needed for cough.    Dispense:  20 capsule    Refill:  0    Time Spent: 24  minutes of total time (12:54 PM- 1:18 PM) was spent on the date of the encounter performing the following actions: chart review prior to seeing the patient, obtaining history, performing a medically necessary exam, counseling on the treatment plan, placing orders, and documenting in our EHR.   Return precautions advised.  Garret Reddish, MD

## 2020-10-23 NOTE — Chronic Care Management (AMB) (Signed)
Chronic Care Management Pharmacy Assistant   Name: Cynthia Berry  MRN: [id number] DOB: 1945/12/02  Reason for Encounter: Disease State/ Hypertension Adherence Call  PCP : Marin Olp, MD  Allergies:   Allergies  Allergen Reactions   Doxycycline    Penicillins     REACTION: swelling,rash   Sulfa Antibiotics     Per pt, no reaction to Sulfa,but you avoid these meds.    Medications: Outpatient Encounter Medications as of 10/23/2020  Medication Sig   ALPRAZolam (XANAX) 0.25 MG tablet take 1 tablet by mouth twice a day if needed   amLODipine (NORVASC) 2.5 MG tablet TAKE 1 TABLET BY MOUTH EVERY DAY   aspirin 81 MG tablet Take 81 mg by mouth daily.   Clotrimazole 1 % OINT Apply 1 application topically 2 (two) times daily.   escitalopram (LEXAPRO) 20 MG tablet TAKE 1 TABLET BY MOUTH EVERY DAY   simvastatin (ZOCOR) 20 MG tablet TAKE 1 TABLET DAILY AT 6 PM*NEED TO CALL OUR OFFICE TO SCHEDULE A APPOINTMENT FOR FURTHER REFILLS   No facility-administered encounter medications on file as of 10/23/2020.    Current Diagnosis: Patient Active Problem List   Diagnosis Date Noted   Osteopenia 03/27/2019   Morbid obesity ([place]) 04/19/2017   Hx of adenomatous polyp of colon 04/30/2016   Allergic rhinitis 12/28/2014   Gout 12/28/2014   Hyperglycemia 08/31/2013   Hyperlipidemia 09/30/2009   Depression 05/22/2007   Essential hypertension 05/22/2007   Reviewed chart prior to disease state call. Spoke with patient regarding BP  Recent Office Vitals: BP Readings from Last 3 Encounters:  08/28/19 126/79  01/11/19 127/83  10/10/18 124/72   Pulse Readings from Last 3 Encounters:  08/28/19 83  10/10/18 (!) 101  07/10/18 97    Wt Readings from Last 3 Encounters:  08/28/19 217 lb (98.4 kg)  06/21/19 218 lb (98.9 kg)  01/11/19 218 lb (98.9 kg)     Kidney Function Lab Results  Component Value Date/Time   CREATININE 0.59 08/31/2019 10:55 AM   CREATININE 0.72  05/16/2018 10:53 AM   GFR 100.00 08/31/2019 10:55 AM   GFRNONAA 107.40 09/30/2009 11:56 AM   GFRAA 162 05/24/2007 03:33 PM    BMP Latest Ref Rng & Units 08/31/2019 05/16/2018 11/29/2017  Glucose 70 - 99 mg/dL 96 118(H) 100(H)  BUN 6 - 23 mg/dL 9 13 9   Creatinine 0.40 - 1.20 mg/dL 0.59 0.72 0.57  Sodium 135 - 145 mEq/L 138 138 139  Potassium 3.5 - 5.1 mEq/L 3.9 4.6 3.6  Chloride 96 - 112 mEq/L 101 100 99  CO2 19 - 32 mEq/L 29 30 31   Calcium 8.4 - 10.5 mg/dL 9.6 10.1 9.7     Current antihypertensive regimen:  o Amlodipine 2.5 mg tablet once a day   How often are you checking your Blood Pressure?  o Patient states she does not currently check her blood pressure at home. Patient states she has a blood pressure cuff at home, she forgets to check it regularly.   Current home BP readings: Patient does not have any home blood pressure readings to report at this time.   What recent interventions/DTPs have been made by any provider to improve Blood Pressure control since last CPP Visit: Patient states her blood pressure has been well controlled for quite some time and takes her blood pressure medication as directed from her provider.   Any recent hospitalizations or ED visits since last visit with CPP? No, the patient has not  had any recent hospitalizations or ED visits since her last visit with CPP.   What diet changes have been made to improve Blood Pressure Control?  o Patient states she avoids salt and foods that are high in sodium. Patient states she does limit her red meat intake as well.   What exercise is being done to improve your Blood Pressure Control?  o Patient states she is not exercising at this time, although she would like to.  Adherence Review: Is the patient currently on ACE/ARB medication? Yes Does the patient have >5 day gap between last estimated fill dates? No  Patient has been experiencing COVID like symptoms the past few days. She sent her PCP Garret Reddish,  MD a patient message via patient portal with her list of symptoms and concerns. Patient states she is currently awaiting response from Dr. Yong Channel. She was encourage to call the office for a quicker response if necessary.  April D Calhoun, [place] Pharmacist Assistant [phone]   Follow-Up:  Pharmacist Review

## 2020-10-24 ENCOUNTER — Encounter: Payer: Self-pay | Admitting: Family Medicine

## 2020-10-24 ENCOUNTER — Telehealth (INDEPENDENT_AMBULATORY_CARE_PROVIDER_SITE_OTHER): Payer: Medicare Other | Admitting: Family Medicine

## 2020-10-24 VITALS — Temp 100.0°F

## 2020-10-24 DIAGNOSIS — J3489 Other specified disorders of nose and nasal sinuses: Secondary | ICD-10-CM | POA: Diagnosis not present

## 2020-10-24 DIAGNOSIS — R059 Cough, unspecified: Secondary | ICD-10-CM

## 2020-10-24 DIAGNOSIS — R509 Fever, unspecified: Secondary | ICD-10-CM | POA: Diagnosis not present

## 2020-10-24 MED ORDER — BENZONATATE 100 MG PO CAPS: 100.0000 mg | ORAL_CAPSULE | Freq: Two times a day (BID) | ORAL | 0 refills | Status: DC | PRN

## 2020-10-24 MED ORDER — AZITHROMYCIN 250 MG PO TABS: ORAL_TABLET | ORAL | 0 refills | Status: DC

## 2020-10-25 ENCOUNTER — Telehealth: Payer: Medicare Other | Admitting: Nurse Practitioner

## 2020-10-25 ENCOUNTER — Telehealth: Payer: Self-pay | Admitting: Internal Medicine

## 2020-10-25 DIAGNOSIS — U071 COVID-19: Secondary | ICD-10-CM

## 2020-10-25 MED ORDER — ALBUTEROL SULFATE HFA 108 (90 BASE) MCG/ACT IN AERS: 2.0000 | INHALATION_SPRAY | Freq: Four times a day (QID) | RESPIRATORY_TRACT | 0 refills | Status: DC | PRN

## 2020-10-25 MED ORDER — NAPROXEN 500 MG PO TABS: 500.0000 mg | ORAL_TABLET | Freq: Two times a day (BID) | ORAL | 0 refills | Status: DC

## 2020-10-25 NOTE — Telephone Encounter (Signed)
COVID, mild symptoms at present, wants consideration for OP COVID therapy.  Vaccinated x 2, no booster.

## 2020-10-25 NOTE — Progress Notes (Signed)
E-Visit for Corona Virus Screening  We are sorry you are not feeling well. We are here to help!  You have tested positive for COVID-19, meaning that you were infected with the novel coronavirus and could give the virus to others.  It is vitally important that you stay home so you do not spread it to others.      Please continue isolation at home, for at least 10 days since the start of your symptoms and until you have had 24 hours with no fever (without taking a fever reducer) and with improving of symptoms.  If you have no symptoms but tested positive (or all symptoms resolve after 5 days and you have no fever) you can leave your house but continue to wear a mask around others for an additional 5 days. If you have a fever,continue to stay home until you have had 24 hours of no fever. Most cases improve 5-10 days from onset but we have seen a small number of patients who have gotten worse after the 10 days.  Please be sure to watch for worsening symptoms and remain taking the proper precautions.   Go to the nearest hospital ED for assessment if fever/cough/breathlessness are severe or illness seems like a threat to life.    The following symptoms may appear 2-14 days after exposure: . Fever . Cough . Shortness of breath or difficulty breathing . Chills . Repeated shaking with chills . Muscle pain . Headache . Sore throat . New loss of taste or smell . Fatigue . Congestion or runny nose . Nausea or vomiting . Diarrhea  You have been enrolled in [place] for COVID-19. Daily you will receive a questionnaire within the [place] website. Our COVID-19 response team will be monitoring your responses daily.  You can use medication such as A prescription anti-inflammatory called Naprosyn 500 mg. Take twice daily as needed for fever or body aches for 2 weeks  You have tested positive for Covid but because you are not considered high risk you do not qualify for monoclonal antibody  infusion.  Supportive care is all that is needed.  * you have already been [rescribed tessalon perles and z pak, according to your chart.  You may also take acetaminophen (Tylenol) as needed for fever.  HOME CARE: . Only take medications as instructed by your medical team. . Drink plenty of fluids and get plenty of rest. . A steam or ultrasonic humidifier can help if you have congestion.   GET HELP RIGHT AWAY IF YOU HAVE EMERGENCY WARNING SIGNS.  Call 911 or proceed to your closest emergency facility if: . You develop worsening high fever. . Trouble breathing . Bluish lips or face . Persistent pain or pressure in the chest . New confusion . Inability to wake or stay awake . You cough up blood. . Your symptoms become more severe . Inability to hold down food or fluids  This list is not all possible symptoms. Contact your medical provider for any symptoms that are severe or concerning to you.    Your e-visit answers were reviewed by a board certified advanced clinical practitioner to complete your personal care plan.  Depending on the condition, your plan could have included both over the counter or prescription medications.  If there is a problem please reply once you have received a response from your provider.  Your safety is important to [place].  If you have drug allergies check your prescription carefully.    You can  use MyChart to ask questions about today's visit, request a non-urgent call back, or ask for a work or school excuse for 24 hours related to this e-Visit. If it has been greater than 24 hours you will need to follow up with your provider, or enter a new e-Visit to address those concerns. You will get an e-mail in the next two days asking about your experience.  I hope that your e-visit has been valuable and will speed your recovery. Thank you for using e-visits.    5-10 minutes spent reviewing and documenting in chart.

## 2020-10-25 NOTE — Addendum Note (Signed)
Addended by: Chevis Pretty on: 10/25/2020 02:31 PM   Modules accepted: Orders

## 2020-10-28 ENCOUNTER — Encounter: Payer: Self-pay | Admitting: Family Medicine

## 2020-10-28 ENCOUNTER — Telehealth: Payer: Self-pay

## 2020-10-28 NOTE — Telephone Encounter (Signed)
Lets call to check on her to see if she needs a follow up virtual visit

## 2020-10-28 NOTE — Telephone Encounter (Signed)
FYI. Patient had e-visit on 1/15

## 2020-10-28 NOTE — Telephone Encounter (Signed)
Nurse Assessment Nurse: Chestine Spore, RN, [place] Date/Time (Eastern Time): 10/25/2020 9:07:50 AM Confirm and document reason for call. If symptomatic, describe symptoms. ---caller states having a temp of 100-101 oral. cough. congestion in nose. s/s X 5 days. caller states had virtual MD visit yesterday. home test for covid-19. positive Does the patient have any new or worsening symptoms? ---Yes Will a triage be completed? ---Yes Related visit to physician within the last 2 weeks? ---Yes Does the PT have any chronic conditions? (i.e. diabetes, asthma, this includes High risk factors for pregnancy, etc.) ---No Is this a behavioral health or substance abuse call? ---No Guidelines Guideline Title Affirmed Question Affirmed Notes Nurse Date/Time (Eastern Time) COVID-19 - Diagnosed or Suspected [1] Fever > 101 F (38.3 C) AND [2] age > 85 years Chestine Spore, [place], [place] 10/25/2020 9:14:32 AM Disp. Time Eilene Ghazi Time) Disposition Final User 10/25/2020 9:23:12 AM See HCP within 4 Hours (or PCP triage) Yes Chestine Spore, RN, Silvestre Moment Disagree/Comply Comply Caller Understands Yes PreDisposition Call Doctor PLEASE NOTE: All timestamps contained within this report are represented as [place] Standard Time. CONFIDENTIALTY NOTICE: This fax transmission is intended only for the addressee. It contains information that is legally privileged, confidential or otherwise protected from use or disclosure. If you are not the intended recipient, you are strictly prohibited from reviewing, disclosing, copying using or disseminating any of this information or taking any action in reliance on or regarding this information. If you have received this fax in error, please notify us immediately by telephone so that we can arrange for its return to [place]. Phone: [phone], Toll-Free: [phone], Fax: [phone] Page: 2 of 2 Call Id: [id number] Care Advice Given Per Guideline SEE HCP (OR PCP TRIAGE) WITHIN 4 HOURS:  * IF OFFICE WILL BE OPEN: You need to be seen within the next 3 or 4 hours. Call your doctor (or NP/PA) now or as soon as the office opens. HOW TO PROTECT OTHERS - WHEN YOU ARE SICK WITH COVID-19: * STAY HOME A MINIMUM OF 10 DAYS: Home isolation is needed for at least 10 days after the symptoms started. Stay home from school or work if you are sick. Do NOT go to religious services, child care centers, shopping, or other public places. Do NOT use public transportation (e.g., bus, taxis, ride-sharing). Do NOT allow any visitors to your home. Leave the house only if you need to seek urgent medical care. GENERAL CARE ADVICE FOR COVID-19 SYMPTOMS: * The treatment is the same whether you have COVID-19, influenza or some other respiratory virus. FEVER MEDICINES: * For fevers above 101 F (38.3 C) take either acetaminophen or ibuprofen. CALL BACK IF: * You become worse CARE ADVICE given per COVID-19 - DIAGNOSED OR SUSPECTED (Adult) guideline. After Care Instructions Given Call Event Type User Date / Time Description Education document email Veatrice Kells 10/25/2020 10:16:54 AM COVID-19 Diagnosed or Suspected Education document email Chestine Spore, RN, Reynold Bowen 10/25/2020 10:16:54 AM COVID-19 Bridget Hartshorn Education document email Chestine Spore, [place], Reynold Bowen 10/25/2020 10:16:55 AM COVID-19 or Influenza - How to Tell Education document email Chestine Spore, [place], [place] 10/25/2020 10:16:55 AM What To Do If You Are Sick With COVID-19_English Comments User: Cheri Kearns, RN Date/Time Eilene Ghazi Time): 10/25/2020 9:34:42 AM this nurse spoke to [facility] and it was confirmed that it takes 48 to 72 hours before infusion can be set up. no virtual appt available for today. caller aware Referrals GO TO FACILITY UNDECIDED [place] [facility]

## 2020-10-29 MED ORDER — ALPRAZOLAM 0.25 MG PO TABS: ORAL_TABLET | ORAL | 0 refills | Status: DC

## 2020-10-29 NOTE — Telephone Encounter (Signed)
Called and lm for pt tcb. 

## 2020-11-12 ENCOUNTER — Other Ambulatory Visit: Payer: Self-pay | Admitting: Nurse Practitioner

## 2020-12-11 ENCOUNTER — Other Ambulatory Visit: Payer: Self-pay

## 2020-12-11 ENCOUNTER — Ambulatory Visit (INDEPENDENT_AMBULATORY_CARE_PROVIDER_SITE_OTHER): Payer: Medicare Other

## 2020-12-11 VITALS — BP 124/78 | HR 89 | Temp 98.4°F | Wt 207.8 lb

## 2020-12-11 DIAGNOSIS — Z0000 Encounter for general adult medical examination without abnormal findings: Secondary | ICD-10-CM

## 2020-12-11 NOTE — Progress Notes (Addendum)
Subjective:   Cynthia Berry is a 75 y.o. female who presents for Medicare Annual (Subsequent) preventive examination.  Review of Systems     Cardiac Risk Factors include: advanced age (>84men, >40 women);obesity (BMI >30kg/m2);hypertension;dyslipidemia     Objective:    Today's Vitals   12/11/20 1105  BP: 124/78  Pulse: 89  Temp: 98.4 F (36.9 C)  SpO2: 98%  Weight: 207 lb 12.8 oz (94.3 kg)   Body mass index is 39.26 kg/m.  Advanced Directives 12/11/2020 11/02/2019 02/19/2016  Does Patient Have a Medical Advance Directive? Yes Yes Yes  Type of Advance Directive Living will Living will;Healthcare Power of Attorney -  Does patient want to make changes to medical advance directive? - No - Patient declined -  Copy of [place] in Chart? - No - copy requested No - copy requested    Current Medications (verified) Outpatient Encounter Medications as of 12/11/2020  Medication Sig  . albuterol (VENTOLIN HFA) 108 (90 Base) MCG/ACT inhaler Inhale 2 puffs into the lungs every 6 (six) hours as needed for wheezing or shortness of breath.  . ALPRAZolam (XANAX) 0.25 MG tablet take 1 tablet by mouth twice a day if needed  . amLODipine (NORVASC) 2.5 MG tablet TAKE 1 TABLET BY MOUTH EVERY DAY  . aspirin 81 MG tablet Take 81 mg by mouth daily.  Marland Kitchen escitalopram (LEXAPRO) 20 MG tablet TAKE 1 TABLET BY MOUTH EVERY DAY  . simvastatin (ZOCOR) 20 MG tablet TAKE 1 TABLET DAILY AT 6 PM*NEED TO CALL OUR OFFICE TO SCHEDULE A APPOINTMENT FOR FURTHER REFILLS  . [DISCONTINUED] azithromycin (ZITHROMAX) 250 MG tablet Take 2 tabs on day 1, then 1 tab daily until finished  . [DISCONTINUED] benzonatate (TESSALON) 100 MG capsule Take 1 capsule (100 mg total) by mouth 2 (two) times daily as needed for cough. (Patient not taking: Reported on 12/11/2020)  . [DISCONTINUED] Clotrimazole 1 % OINT Apply 1 application topically 2 (two) times daily. (Patient not taking: Reported on 10/24/2020)  . [DISCONTINUED]  naproxen (NAPROSYN) 500 MG tablet Take 1 tablet (500 mg total) by mouth 2 (two) times daily with a meal. (Patient not taking: Reported on 12/11/2020)   No facility-administered encounter medications on file as of 12/11/2020.    Allergies (verified) Doxycycline, Penicillins, and Sulfa antibiotics   History: Past Medical History:  Diagnosis Date  . Anemia   . Anxiety    Pt very anxious/sensitive to certain meds!  Marland Kitchen COVID 2022  . Depression   . Hx of adenomatous polyp of colon 04/30/2016  . Hyperlipidemia   . Hypertension   . Post-operative nausea and vomiting    due to nerves   Past Surgical History:  Procedure Laterality Date  . TONSILLECTOMY     as child   Family History  Problem Relation Age of Onset  . Stroke Mother 59  . Heart disease Father 22       mi, smoker  . Lung cancer Sister        1/2 sister-lung cancer  . Heart disease Sister   . Colon cancer Neg Hx   . Esophageal cancer Neg Hx   . Rectal cancer Neg Hx   . Stomach cancer Neg Hx   . Breast cancer Neg Hx    Social History   Socioeconomic History  . Marital status: Widowed    Spouse name: Not on file  . Number of children: Not on file  . Years of education: Not on file  .  Highest education level: Not on file  Occupational History  . Not on file  Tobacco Use  . Smoking status: Never Smoker  . Smokeless tobacco: Never Used  Substance and Sexual Activity  . Alcohol use: Yes    Comment: about 1 drink per month  . Drug use: No  . Sexual activity: Never  Other Topics Concern  . Not on file  Social History Narrative   Widowed 2008. 2 daughters-1 in [place] and 1 in [place]. 2 grandkids      Taught special education until daughters born   Cynthia Berry      Hobbies: dancing, cooking and family gatherings.    Social Determinants of Health   Financial Resource Strain: Low Risk   . Difficulty of Paying Living Expenses: Not hard at all  Food Insecurity: No Food Insecurity  . Worried About Sales executive in the Last Year: Never true  . Ran Out of Food in the Last Year: Never true  Transportation Needs: No Transportation Needs  . Lack of Transportation (Medical): No  . Lack of Transportation (Non-Medical): No  Physical Activity: Inactive  . Days of Exercise per Week: 0 days  . Minutes of Exercise per Session: 0 min  Stress: No Stress Concern Present  . Feeling of Stress : Not at all  Social Connections: Moderately Integrated  . Frequency of Communication with Friends and Family: More than three times a week  . Frequency of Social Gatherings with Friends and Family: More than three times a week  . Attends Religious Services: More than 4 times per year  . Active Member of Clubs or Organizations: Yes  . Attends Archivist Meetings: Not on file  . Marital Status: Widowed    Tobacco Counseling Counseling given: Not Answered   Clinical Intake:  Pre-visit preparation completed: Yes  Pain : No/denies pain     Nutritional Status: BMI > 30  Obese Nutritional Risks: None Diabetes: No  How often do you need to have someone help you when you read instructions, pamphlets, or other written materials from your doctor or pharmacy?: 1 - Never  Diabetic?No  Interpreter Needed?: No  Information entered by :: Charlott Rakes, LPN   Activities of Daily Living In your present state of health, do you have any difficulty performing the following activities: 12/11/2020  Hearing? N  Vision? N  Difficulty concentrating or making decisions? N  Walking or climbing stairs? N  Dressing or bathing? N  Doing errands, shopping? N  Preparing Food and eating ? N  Using the Toilet? N  In the past six months, have you accidently leaked urine? N  Do you have problems with loss of bowel control? N  Managing your Medications? N  Managing your Finances? N  Housekeeping or managing your Housekeeping? N  Some recent data might be hidden    Patient Care Team: Marin Olp, MD as PCP  - General (Family Medicine) Madelin Rear, [facility] as Pharmacist (Pharmacist)  Indicate any recent Medical Services you may have received from other than [facility] providers in the past year (date may be approximate).     Assessment:   This is a routine wellness examination for Cynthia Berry.  Hearing/Vision screen  Hearing Screening   125Hz  250Hz  500Hz  1000Hz  2000Hz  3000Hz  4000Hz  6000Hz  8000Hz   Right ear:           Left ear:           Comments: Pt denies hearing issues  Vision  Screening Comments: Dr Celene Squibb follows up for annual eye exam  Dietary issues and exercise activities discussed: Current Exercise Habits: The patient does not participate in regular exercise at present  Goals    . Patient Stated     Lose weight and exercise    . PharmD Care Plan     CARE PLAN ENTRY (see longitudinal plan of care for additional care plan information)  Current Barriers:  . Chronic Disease Management support, education, and care coordination needs related to Hypertension, Hyperlipidemia, and Depression   Hypertension BP Readings from Last 3 Encounters:  08/28/19 126/79  01/11/19 127/83  10/10/18 124/72   . Pharmacist Clinical Goal(s): o Over the next 180 days, patient will work with PharmD and providers to maintain BP goal <130/80 . Current regimen:  o Amlodipine 2.5 mg once daily . Interventions: o Diet/exercise recommendations . Patient self care activities - Over the next 180 days, patient will: o Check BP once every 1-2 weeks, document, and provide at future appointments o Ensure daily salt intake < 2300 mg/day  Hyperlipidemia Lab Results  Component Value Date/Time   LDLCALC 81 08/31/2019 10:55 AM   LDLDIRECT 104.0 05/16/2018 10:53 AM   . Pharmacist Clinical Goal(s): o Over the next 180 days, patient will work with PharmD and providers to achieve LDL goal < 70 . Current regimen:  o Simvastatin 20 mg once daily . Interventions: o Diet/exercise recommendations  . Patient self care  activities - Over the next 180 days, patient will: o Continue current management Depression . Pharmacist Clinical Goal(s) o Over the next 180 days, patient will work with PharmD and providers to minimize depression connect . Current regimen:  o Escitalopram 20 mg once daily  . Interventions: o N/a . Patient self care activities - Over the next 180 days, patient will: o Continue current management  Medication management . Pharmacist Clinical Goal(s): o Over the next 180 days, patient will work with PharmD and providers to maintain optimal medication adherence . Current pharmacy: [facility] . Interventions o Comprehensive medication review performed. o Continue current medication management strategy. . Patient self care activities - Over the next 180 days, patient will: o Take medications as prescribed o Report any questions or concerns to PharmD and/or provider(s) Initial goal documentation.      Depression Screen PHQ 2/9 Scores 12/11/2020 10/24/2020 06/24/2020 11/02/2019 08/28/2019 01/11/2019 05/16/2018  PHQ - 2 Score 0 0 0 0 0 0 0  PHQ- 9 Score - 0 0 - 0 0 0    Fall Risk Fall Risk  12/11/2020 11/02/2019 05/16/2018 04/19/2017 02/19/2016  Falls in the past year? 0 0 No No No  Number falls in past yr: 0 0 - - -  Injury with Fall? 0 0 - - -  Risk for fall due to : Impaired vision - - - -  Follow up Falls prevention discussed Falls evaluation completed;Falls prevention discussed;Education provided - - -    FALL RISK PREVENTION PERTAINING TO THE HOME:  Any stairs in or around the home? Yes  If so, are there any without handrails? No  Home free of loose throw rugs in walkways, pet beds, electrical cords, etc? Yes  Adequate lighting in your home to reduce risk of falls? Yes   ASSISTIVE DEVICES UTILIZED TO PREVENT FALLS:  Life alert? No  Use of a cane, walker or w/c? No  Grab bars in the bathroom? Yes  Shower chair or bench in shower? No  Elevated toilet seat or a handicapped toilet?  Yes    TIMED UP AND GO:  Was the test performed? Yes .  Length of time to ambulate 10 feet: 10 sec.   Gait steady and fast without use of assistive device  Cognitive Function:     6CIT Screen 12/11/2020 11/02/2019  What Year? 0 points 0 points  What month? 0 points 0 points  What time? - 0 points  Count back from 20 0 points 0 points  Months in reverse 0 points 0 points  Repeat phrase 0 points 0 points  Total Score - 0    Immunizations Immunization History  Administered Date(s) Administered  . Fluad Quad(high Dose 65+) 08/01/2019, 08/19/2020  . Influenza Whole 08/13/2009  . Influenza, High Dose Seasonal PF 09/06/2016, 08/05/2017, 08/09/2018  . Influenza-Unspecified 07/11/2013, 08/06/2015  . PFIZER(Purple Top)SARS-COV-2 Vaccination 10/31/2019, 11/18/2019  . Pneumococcal Conjugate-13 12/12/2014  . Pneumococcal Polysaccharide-23 02/04/2016  . Td 08/11/2000    TDAP status: Due, Education has been provided regarding the importance of this vaccine. Advised may receive this vaccine at local pharmacy or Health Dept. Aware to provide a copy of the vaccination record if obtained from local pharmacy or Health Dept. Verbalized acceptance and understanding.  Flu Vaccine status: Up to date  Pneumococcal vaccine status: Up to date  Covid-19 vaccine status: Completed vaccines Booster recommended  Qualifies for Shingles Vaccine? Yes   Zostavax completed No   Shingrix Completed?: No.    Education has been provided regarding the importance of this vaccine. Patient has been advised to call insurance company to determine out of pocket expense if they have not yet received this vaccine. Advised may also receive vaccine at local pharmacy or Health Dept. Verbalized acceptance and understanding.  Screening Tests Health Maintenance  Topic Date Due  . TETANUS/TDAP  08/11/2010  . COVID-19 Vaccine (3 - Booster for Pfizer series) 05/17/2020  . MAMMOGRAM  09/10/2022  . COLONOSCOPY (Pts 45-63yrs  Insurance coverage will need to be confirmed)  05/04/2023  . INFLUENZA VACCINE  Completed  . DEXA SCAN  Completed  . Hepatitis C Screening  Completed  . PNA vac Low Risk Adult  Completed  . HPV VACCINES  Aged Out    Health Maintenance  Health Maintenance Due  Topic Date Due  . TETANUS/TDAP  08/11/2010  . COVID-19 Vaccine (3 - Booster for Pfizer series) 05/17/2020    Colorectal cancer screening: Type of screening: Colonoscopy. Completed 05/03/18. Repeat every 7 years  Mammogram status: Completed 09/10/20. Repeat every year  Bone Density status: Completed 03/27/19. Results reflect: Bone density results: OSTEOPENIA. Repeat every 2 years.   Additional Screening:  Hepatitis C Screening:  Completed 11/29/17  Vision Screening: Recommended annual ophthalmology exams for early detection of glaucoma and other disorders of the eye. Is the patient up to date with their annual eye exam?  Yes  Who is the provider or what is the name of the office in which the patient attends annual eye exams? Dr Eliezer Bottom If pt is not established with a provider, would they like to be referred to a provider to establish care? No .   Dental Screening: Recommended annual dental exams for proper oral hygiene  Community Resource Referral / Chronic Care Management: CRR required this visit?  No   CCM required this visit?  No      Plan:     I have personally reviewed and noted the following in the patient's chart:   . Medical and social history . Use of alcohol, tobacco or illicit drugs  .  Current medications and supplements . Functional ability and status . Nutritional status . Physical activity . Advanced directives . List of other physicians . Hospitalizations, surgeries, and ER visits in previous 12 months . Vitals . Screenings to include cognitive, depression, and falls . Referrals and appointments  In addition, I have reviewed and discussed with patient certain preventive protocols, quality  metrics, and best practice recommendations. A written personalized care plan for preventive services as well as general preventive health recommendations were provided to patient.     Willette Brace, LPN   10/18/2881   Nurse Notes: None

## 2020-12-11 NOTE — Patient Instructions (Signed)
Ms. Cynthia Berry , Thank you for taking time to come for your Medicare Wellness Visit. I appreciate your ongoing commitment to your health goals. Please review the following plan we discussed and let me know if I can assist you in the future.   Screening recommendations/referrals: Colonoscopy: Done 05/03/16 Mammogram: Done 09/10/20 Bone Density: Done 03/27/19 Recommended yearly ophthalmology/optometry visit for glaucoma screening and checkup Recommended yearly dental visit for hygiene and checkup  Vaccinations: Influenza vaccine: Done 08/19/20 Pneumococcal vaccine: Up to date Tdap vaccine: Up to date Shingles vaccine: Shingrix discussed. Please contact your pharmacy for coverage information.    Covid-19:Completed 10/31/19 & 11/18/19  Advanced directives: Please bring a copy of your health care power of attorney and living will to the office at your convenience.  Conditions/risks identified: Lose weight and exercise   Next appointment: Follow up in one year for your annual wellness visit    Preventive Care 65 Years and Older, Female Preventive care refers to lifestyle choices and visits with your health care provider that can promote health and wellness. What does preventive care include?  A yearly physical exam. This is also called an annual well check.  Dental exams once or twice a year.  Routine eye exams. Ask your health care provider how often you should have your eyes checked.  Personal lifestyle choices, including:  Daily care of your teeth and gums.  Regular physical activity.  Eating a healthy diet.  Avoiding tobacco and drug use.  Limiting alcohol use.  Practicing safe sex.  Taking low-dose aspirin every day.  Taking vitamin and mineral supplements as recommended by your health care provider. What happens during an annual well check? The services and screenings done by your health care provider during your annual well check will depend on your age, overall health,  lifestyle risk factors, and family history of disease. Counseling  Your health care provider may ask you questions about your:  Alcohol use.  Tobacco use.  Drug use.  Emotional well-being.  Home and relationship well-being.  Sexual activity.  Eating habits.  History of falls.  Memory and ability to understand (cognition).  Work and work Statistician.  Reproductive health. Screening  You may have the following tests or measurements:  Height, weight, and BMI.  Blood pressure.  Lipid and cholesterol levels. These may be checked every 5 years, or more frequently if you are over 50 years old.  Skin check.  Lung cancer screening. You may have this screening every year starting at age 85 if you have a 30-pack-year history of smoking and currently smoke or have quit within the past 15 years.  Fecal occult blood test (FOBT) of the stool. You may have this test every year starting at age 92.  Flexible sigmoidoscopy or colonoscopy. You may have a sigmoidoscopy every 5 years or a colonoscopy every 10 years starting at age 58.  Hepatitis C blood test.  Hepatitis B blood test.  Sexually transmitted disease (STD) testing.  Diabetes screening. This is done by checking your blood sugar (glucose) after you have not eaten for a while (fasting). You may have this done every 1-3 years.  Bone density scan. This is done to screen for osteoporosis. You may have this done starting at age 25.  Mammogram. This may be done every 1-2 years. Talk to your health care provider about how often you should have regular mammograms. Talk with your health care provider about your test results, treatment options, and if necessary, the need for more tests. Vaccines  Your health care provider may recommend certain vaccines, such as:  Influenza vaccine. This is recommended every year.  Tetanus, diphtheria, and acellular pertussis (Tdap, Td) vaccine. You may need a Td booster every 10 years.  Zoster  vaccine. You may need this after age 66.  Pneumococcal 13-valent conjugate (PCV13) vaccine. One dose is recommended after age 21.  Pneumococcal polysaccharide (PPSV23) vaccine. One dose is recommended after age 70. Talk to your health care provider about which screenings and vaccines you need and how often you need them. This information is not intended to replace advice given to you by your health care provider. Make sure you discuss any questions you have with your health care provider. Document Released: 10/24/2015 Document Revised: 06/16/2016 Document Reviewed: 07/29/2015 Elsevier Interactive Patient Education  2017 [employer] Prevention in the Home Falls can cause injuries. They can happen to people of all ages. There are many things you can do to make your home safe and to help prevent falls. What can I do on the outside of my home?  Regularly fix the edges of walkways and driveways and fix any cracks.  Remove anything that might make you trip as you walk through a door, such as a raised step or threshold.  Trim any bushes or trees on the path to your home.  Use bright outdoor lighting.  Clear any walking paths of anything that might make someone trip, such as rocks or tools.  Regularly check to see if handrails are loose or broken. Make sure that both sides of any steps have handrails.  Any raised decks and porches should have guardrails on the edges.  Have any leaves, snow, or ice cleared regularly.  Use sand or salt on walking paths during winter.  Clean up any spills in your garage right away. This includes oil or grease spills. What can I do in the bathroom?  Use night lights.  Install grab bars by the toilet and in the tub and shower. Do not use towel bars as grab bars.  Use non-skid mats or decals in the tub or shower.  If you need to sit down in the shower, use a plastic, non-slip stool.  Keep the floor dry. Clean up any water that spills on the  floor as soon as it happens.  Remove soap buildup in the tub or shower regularly.  Attach bath mats securely with double-sided non-slip rug tape.  Do not have throw rugs and other things on the floor that can make you trip. What can I do in the bedroom?  Use night lights.  Make sure that you have a light by your bed that is easy to reach.  Do not use any sheets or blankets that are too big for your bed. They should not hang down onto the floor.  Have a firm chair that has side arms. You can use this for support while you get dressed.  Do not have throw rugs and other things on the floor that can make you trip. What can I do in the kitchen?  Clean up any spills right away.  Avoid walking on wet floors.  Keep items that you use a lot in easy-to-reach places.  If you need to reach something above you, use a strong step stool that has a grab bar.  Keep electrical cords out of the way.  Do not use floor polish or wax that makes floors slippery. If you must use wax, use non-skid floor wax.  Do  not have throw rugs and other things on the floor that can make you trip. What can I do with my stairs?  Do not leave any items on the stairs.  Make sure that there are handrails on both sides of the stairs and use them. Fix handrails that are broken or loose. Make sure that handrails are as long as the stairways.  Check any carpeting to make sure that it is firmly attached to the stairs. Fix any carpet that is loose or worn.  Avoid having throw rugs at the top or bottom of the stairs. If you do have throw rugs, attach them to the floor with carpet tape.  Make sure that you have a light switch at the top of the stairs and the bottom of the stairs. If you do not have them, ask someone to add them for you. What else can I do to help prevent falls?  Wear shoes that:  Do not have high heels.  Have rubber bottoms.  Are comfortable and fit you well.  Are closed at the toe. Do not wear  sandals.  If you use a stepladder:  Make sure that it is fully opened. Do not climb a closed stepladder.  Make sure that both sides of the stepladder are locked into place.  Ask someone to hold it for you, if possible.  Clearly mark and make sure that you can see:  Any grab bars or handrails.  First and last steps.  Where the edge of each step is.  Use tools that help you move around (mobility aids) if they are needed. These include:  Canes.  Walkers.  Scooters.  Crutches.  Turn on the lights when you go into a dark area. Replace any light bulbs as soon as they burn out.  Set up your furniture so you have a clear path. Avoid moving your furniture around.  If any of your floors are uneven, fix them.  If there are any pets around you, be aware of where they are.  Review your medicines with your doctor. Some medicines can make you feel dizzy. This can increase your chance of falling. Ask your doctor what other things that you can do to help prevent falls. This information is not intended to replace advice given to you by your health care provider. Make sure you discuss any questions you have with your health care provider. Document Released: 07/24/2009 Document Revised: 03/04/2016 Document Reviewed: 11/01/2014 Elsevier Interactive Patient Education  2017 [employer].

## 2020-12-13 ENCOUNTER — Other Ambulatory Visit: Payer: Self-pay | Admitting: Family Medicine

## 2020-12-24 ENCOUNTER — Ambulatory Visit: Payer: Medicare Other

## 2020-12-24 NOTE — Progress Notes (Signed)
  Chronic Care Management   Outreach Note   Name: Cynthia Berry MRN: [id number] DOB: 12/08/1945  Referred by: Marin Olp, MD Reason for referral: Telephone Appointment with [facility] Pharmacist, Madelin Rear.   An unsuccessful telephone outreach was attempted today. The patient was referred to the pharmacist for assistance with care management and care coordination.   Telephone appointment with clinical pharmacist today (12/24/2020) at 1pm. If patient immediately returns call, transfer to [phone]. Otherwise, please provide this number so patient can reschedule visit.   Madelin Rear, Pharm.D., BCGP Clinical Pharmacist [facility] [phone]

## 2021-01-30 DIAGNOSIS — H2513 Age-related nuclear cataract, bilateral: Secondary | ICD-10-CM | POA: Diagnosis not present

## 2021-01-30 DIAGNOSIS — H40033 Anatomical narrow angle, bilateral: Secondary | ICD-10-CM | POA: Diagnosis not present

## 2021-02-23 ENCOUNTER — Telehealth: Payer: Self-pay

## 2021-02-23 NOTE — Chronic Care Management (AMB) (Signed)
Chronic Care Management Pharmacy Assistant   Name: Cynthia Berry  MRN: [id number] DOB: [birth year]  Reason for Encounter: Hypertension Disease State Call  Recent office visits:  10/25/20- [place], [place] (E- Visit)- E-Visit for Covid 19 Positive Test Follow up, started albuterol inhaler, short course naproxen given, no follow up documented 10/24/20- Cynthia Reddish, [place] (Video Visit)- Seen for fever, cough and congestion. Short course azithromycin x 5DS , short course tessalon prn for cough, no follow up documented  Recent consult visits:  No visits noted  Hospital visits:  None in previous 6 months  Medications: Outpatient Encounter Medications as of 02/23/2021  Medication Sig  . albuterol (VENTOLIN HFA) 108 (90 Base) MCG/ACT inhaler Inhale 2 puffs into the lungs every 6 (six) hours as needed for wheezing or shortness of breath.  . ALPRAZolam (XANAX) 0.25 MG tablet take 1 tablet by mouth twice a day if needed  . amLODipine (NORVASC) 2.5 MG tablet TAKE 1 TABLET BY MOUTH EVERY DAY  . aspirin 81 MG tablet Take 81 mg by mouth daily.  Marland Kitchen escitalopram (LEXAPRO) 20 MG tablet TAKE 1 TABLET BY MOUTH EVERY DAY  . simvastatin (ZOCOR) 20 MG tablet TAKE 1 TABLET DAILY AT 6 PM*NEED TO CALL OUR OFFICE TO SCHEDULE A APPOINTMENT FOR FURTHER REFILLS   No facility-administered encounter medications on file as of 02/23/2021.   Reviewed chart prior to disease state call. Spoke with patient regarding BP  Recent Office Vitals: BP Readings from Last 3 Encounters:  12/11/20 124/78  08/28/19 126/79  01/11/19 127/83   Pulse Readings from Last 3 Encounters:  12/11/20 89  08/28/19 83  10/10/18 (!) 101    Wt Readings from Last 3 Encounters:  12/11/20 207 lb 12.8 oz (94.3 kg)  08/28/19 217 lb (98.4 kg)  06/21/19 218 lb (98.9 kg)     Kidney Function Lab Results  Component Value Date/Time   CREATININE 0.59 08/31/2019 10:55 AM   CREATININE 0.72 05/16/2018 10:53 AM   GFR 100.00 08/31/2019  10:55 AM   GFRNONAA 107.40 09/30/2009 11:56 AM   GFRAA 162 05/24/2007 03:33 PM    BMP Latest Ref Rng & Units 08/31/2019 05/16/2018 11/29/2017  Glucose 70 - 99 mg/dL 96 118(H) 100(H)  BUN 6 - 23 mg/dL 9 13 9   Creatinine 0.40 - 1.20 mg/dL 0.59 0.72 0.57  Sodium 135 - 145 mEq/L 138 138 139  Potassium 3.5 - 5.1 mEq/L 3.9 4.6 3.6  Chloride 96 - 112 mEq/L 101 100 99  CO2 19 - 32 mEq/L 29 30 31   Calcium 8.4 - 10.5 mg/dL 9.6 10.1 9.7   I spoke with [name] this morning and she is doing well. She has not been tracking her BP since she came back from vacation. She could not remember any ranges where her BP was before her vacation so we agreed that from now on she will check more regularly as well as record her readings. At our next check in we will review her BP log and she will be scheduled for a CPP visit. [name] takes all of her medications as directed. Her night time meds are taken pretty late due to her staying up late. She mostly reads or watches tv at night for long periods of time. She is also active during the day since the weather has been getting better. Overall she has no complaints or concerns for her health.  Current antihypertensive regimen:  Amlodipine 2.5 mg tablet every evening   How often are  you checking your Blood Pressure?  Patient stated she does not take her blood pressure regularly. She has not checked in over a week since she got back from vacation. I encouraged her to maintain a log of her BP and will check in with her at the end of this month for some readings.   Current home BP readings: None ID  What recent interventions/DTPs have been made by any provider to improve Blood Pressure control since last CPP Visit:  No recent interventions  Any recent hospitalizations or ED visits since last visit with CPP? No  What diet changes have been made to improve Blood Pressure Control?  Patient has no diet changes  What exercise is being done to improve your Blood Pressure  Control?  Patient has no changes in activity    Adherence Review: Is the patient currently on ACE/ARB medication? No  Does the patient have >5 day gap between last estimated fill dates? No  Amlodipine 2.5 mg- 90 DS last filled 12/11/20  Star Rating Drugs: Simvastatin 20 mg- 90 DS last filled 12/15/20  Wilford Sports CPA, CMA

## 2021-03-13 ENCOUNTER — Other Ambulatory Visit: Payer: Self-pay | Admitting: Family Medicine

## 2021-03-25 DIAGNOSIS — H40031 Anatomical narrow angle, right eye: Secondary | ICD-10-CM | POA: Diagnosis not present

## 2021-04-01 DIAGNOSIS — H40032 Anatomical narrow angle, left eye: Secondary | ICD-10-CM | POA: Diagnosis not present

## 2021-04-09 ENCOUNTER — Other Ambulatory Visit: Payer: Self-pay | Admitting: Family Medicine

## 2021-04-15 ENCOUNTER — Other Ambulatory Visit: Payer: Self-pay | Admitting: Family Medicine

## 2021-05-26 ENCOUNTER — Telehealth: Payer: Self-pay | Admitting: Pharmacist

## 2021-05-26 NOTE — Chronic Care Management (AMB) (Addendum)
Chronic Care Management Pharmacy Assistant   Name: Cynthia Berry  MRN: [id number] DOB: 11-Feb-1946  Reason for Encounter: Hypertension Adherence Call    Recent office visits:  None  Recent consult visits:  None  Hospital visits:  None in previous 6 months  Medications: Outpatient Encounter Medications as of 05/26/2021  Medication Sig   albuterol (VENTOLIN HFA) 108 (90 Base) MCG/ACT inhaler Inhale 2 puffs into the lungs every 6 (six) hours as needed for wheezing or shortness of breath.   ALPRAZolam (XANAX) 0.25 MG tablet take 1 tablet by mouth twice a day if needed   amLODipine (NORVASC) 2.5 MG tablet TAKE 1 TABLET BY MOUTH EVERY DAY   aspirin 81 MG tablet Take 81 mg by mouth daily.   escitalopram (LEXAPRO) 20 MG tablet TAKE 1 TABLET BY MOUTH EVERY DAY   simvastatin (ZOCOR) 20 MG tablet TAKE 1 TABLET DAILY AT 6 PM*NEED TO CALL OUR OFFICE TO SCHEDULE A APPOINTMENT FOR FURTHER REFILLS   No facility-administered encounter medications on file as of 05/26/2021.   Reviewed chart prior to disease state call. Spoke with patient regarding BP  Recent Office Vitals: BP Readings from Last 3 Encounters:  12/11/20 124/78  08/28/19 126/79  01/11/19 127/83   Pulse Readings from Last 3 Encounters:  12/11/20 89  08/28/19 83  10/10/18 (!) 101    Wt Readings from Last 3 Encounters:  12/11/20 207 lb 12.8 oz (94.3 kg)  08/28/19 217 lb (98.4 kg)  06/21/19 218 lb (98.9 kg)     Kidney Function Lab Results  Component Value Date/Time   CREATININE 0.59 08/31/2019 10:55 AM   CREATININE 0.72 05/16/2018 10:53 AM   GFR 100.00 08/31/2019 10:55 AM   GFRNONAA 107.40 09/30/2009 11:56 AM   GFRAA 162 05/24/2007 03:33 PM    BMP Latest Ref Rng & Units 08/31/2019 05/16/2018 11/29/2017  Glucose 70 - 99 mg/dL 96 118(H) 100(H)  BUN 6 - 23 mg/dL '9 13 9  '$ Creatinine 0.40 - 1.20 mg/dL 0.59 0.72 0.57  Sodium 135 - 145 mEq/L 138 138 139  Potassium 3.5 - 5.1 mEq/L 3.9 4.6 3.6  Chloride 96 - 112 mEq/L 101 100  99  CO2 19 - 32 mEq/L '29 30 31  '$ Calcium 8.4 - 10.5 mg/dL 9.6 10.1 9.7    Current antihypertensive regimen:  Amlodipine 2.5 mg daily  How often are you checking your Blood Pressure? 3-5x per week  Current home BP readings: 117-123/68-76  What recent interventions/DTPs have been made by any provider to improve Blood Pressure control since last CPP Visit: No interventions or DTPs.  Any recent hospitalizations or ED visits since last visit with CPP? No  What diet changes have been made to improve Blood Pressure Control?  Patient states she tries her best to make sure she eats healthy food choices.  What exercise is being done to improve your Blood Pressure Control?  Patient states she tries to walk as much as she can.  Adherence Review: Is the patient currently on ACE/ARB medication? No Does the patient have >5 day gap between last estimated fill dates? No  Patient scheduled a follow up telephone call for 07/20/2021 at 10:30 am.  Future Appointments  Date Time Provider [place]  12/17/2021 11:00 AM [facility]     Star Rating Drugs: Simvastatin 20 mg last filled 04/23/2021 90 DS  April D Calhoun, [place] Pharmacist Assistant [phone]   10 minutes spent in review, coordination, and documentation.  Reviewed by:  Beverly Milch, PharmD Clinical Pharmacist [phone]

## 2021-06-17 ENCOUNTER — Encounter: Payer: Self-pay | Admitting: Family Medicine

## 2021-07-15 NOTE — Progress Notes (Deleted)
Chronic Care Management Pharmacy Note  07/16/2021 Name:  Cynthia Berry MRN:  [id number] DOB:  07/01/1946  Summary: ***  Recommendations/Changes made from today's visit: ***  Plan: ***   Subjective: Cynthia Berry is an 75 y.o. year old female who is a primary patient of Hunter, Brayton Mars, MD.  The CCM team was consulted for assistance with disease management and care coordination needs.    {CCMTELEPHONEFACETOFACE:21091510} for {CCMINITIALFOLLOWUPCHOICE:21091511} in response to provider referral for pharmacy case management and/or care coordination services.   Consent to Services:  {CCMCONSENTOPTIONS:25074}  Patient Care Team: Marin Olp, MD as PCP - General (Family Medicine) Madelin Rear, [facility] as Pharmacist (Pharmacist)  Recent office visits:  None   Recent consult visits:  None   Hospital visits:  None in previous 6 months  Objective:  Lab Results  Component Value Date   CREATININE 0.59 08/31/2019   BUN 9 08/31/2019   GFR 100.00 08/31/2019   GFRNONAA 107.40 09/30/2009   GFRAA 162 05/24/2007   NA 138 08/31/2019   K 3.9 08/31/2019   CALCIUM 9.6 08/31/2019   CO2 29 08/31/2019   GLUCOSE 96 08/31/2019    Lab Results  Component Value Date/Time   HGBA1C 5.6 08/31/2019 10:55 AM   HGBA1C 5.8 05/16/2018 10:53 AM   GFR 100.00 08/31/2019 10:55 AM   GFR 84.78 05/16/2018 10:53 AM    Last diabetic Eye exam: No results found for: HMDIABEYEEXA  Last diabetic Foot exam: No results found for: HMDIABFOOTEX   Lab Results  Component Value Date   CHOL 158 08/31/2019   HDL 55.30 08/31/2019   LDLCALC 81 08/31/2019   LDLDIRECT 104.0 05/16/2018   TRIG 108.0 08/31/2019   CHOLHDL 3 08/31/2019    Hepatic Function Latest Ref Rng & Units 08/31/2019 05/16/2018 11/29/2017  Total Protein 6.0 - 8.3 g/dL 7.2 7.2 7.3  Albumin 3.5 - 5.2 g/dL 4.1 4.0 3.8  AST 0 - 37 U/L 15 14 17   ALT 0 - 35 U/L 12 12 15   Alk Phosphatase 39 - 117 U/L 67 56 56  Total Bilirubin 0.2 - 1.2 mg/dL 0.7  0.7 0.6  Bilirubin, Direct 0.0 - 0.3 mg/dL - - -    Lab Results  Component Value Date/Time   TSH 2.57 02/11/2016 09:09 AM   TSH 0.44 01/29/2013 10:17 AM    CBC Latest Ref Rng & Units 08/31/2019 05/16/2018 03/22/2018  WBC 4.0 - 10.5 K/uL 9.9 7.6 7.0  Hemoglobin 12.0 - 15.0 g/dL 13.3 13.2 12.4  Hematocrit 36.0 - 46.0 % 39.3 39.3 37.1  Platelets 150.0 - 400.0 K/uL 308.0 324.0 288    No results found for: VD25OH  Clinical ASCVD: {YES/NO:21197} The 10-year ASCVD risk score (Arnett DK, et al., 2019) is: 17.4%   Values used to calculate the score:     Age: 33 years     Sex: Female     Is Non-Hispanic African American: No     Diabetic: No     Tobacco smoker: No     Systolic Blood Pressure: 301 mmHg     Is BP treated: Yes     HDL Cholesterol: 55.3 mg/dL     Total Cholesterol: 158 mg/dL    Depression screen [facility] 2/9 12/11/2020 10/24/2020 06/24/2020  Decreased Interest 0 0 0  Down, Depressed, Hopeless 0 0 0  PHQ - 2 Score 0 0 0  Altered sleeping - 0 0  Tired, decreased energy - 0 0  Change in appetite - 0 0  Feeling  bad or failure about yourself  - 0 0  Trouble concentrating - 0 0  Moving slowly or fidgety/restless - 0 0  Suicidal thoughts - 0 0  PHQ-9 Score - 0 0  Difficult doing work/chores - - -     ***Other: (CHADS2VASc if Afib, MMRC or CAT for COPD, ACT, DEXA)  Social History   Tobacco Use  Smoking Status Never  Smokeless Tobacco Never   BP Readings from Last 3 Encounters:  12/11/20 124/78  08/28/19 126/79  01/11/19 127/83   Pulse Readings from Last 3 Encounters:  12/11/20 89  08/28/19 83  10/10/18 (!) 101   Wt Readings from Last 3 Encounters:  12/11/20 207 lb 12.8 oz (94.3 kg)  08/28/19 217 lb (98.4 kg)  06/21/19 218 lb (98.9 kg)   BMI Readings from Last 3 Encounters:  12/11/20 39.26 kg/m  08/28/19 41.00 kg/m  06/21/19 41.19 kg/m    Assessment/Interventions: Review of patient past medical history, allergies, medications, health status, including  review of consultants reports, laboratory and other test data, was performed as part of comprehensive evaluation and provision of chronic care management services.   SDOH:  (Social Determinants of Health) assessments and interventions performed: {yes/no:20286}  SDOH Screenings   Alcohol Screen: Not on file  Depression (PHQ2-9): Low Risk    PHQ-2 Score: 0  Financial Resource Strain: Low Risk    Difficulty of Paying Living Expenses: Not hard at all  Food Insecurity: No Food Insecurity   Worried About Charity fundraiser in the Last Year: Never true   Ran Out of Food in the Last Year: Never true  Housing: Low Risk    Last Housing Risk Score: 0  Physical Activity: Inactive   Days of Exercise per Week: 0 days   Minutes of Exercise per Session: 0 min  Social Connections: Moderately Integrated   Frequency of Communication with Friends and Family: More than three times a week   Frequency of Social Gatherings with Friends and Family: More than three times a week   Attends Religious Services: More than 4 times per year   Active Member of [employer] or Organizations: Yes   Attends Archivist Meetings: Not on file   Marital Status: Widowed  Stress: No Stress Concern Present   Feeling of Stress : Not at all  Tobacco Use: Low Risk    Smoking Tobacco Use: Never   Smokeless Tobacco Use: Never  Transportation Needs: No Transportation Needs   Lack of Transportation (Medical): No   Lack of Transportation (Non-Medical): No    CCM Care Plan  Allergies  Allergen Reactions   Doxycycline    Penicillins     REACTION: swelling,rash   Sulfa Antibiotics     Per pt, no reaction to Sulfa,but you avoid these meds.    Medications Reviewed Today     Reviewed by Willette Brace, LPN (Licensed Practical Nurse) on 12/11/20 at 1118  Med List Status: <None>   Medication Order Taking? Sig Documenting Provider Last Dose Status Informant  albuterol (VENTOLIN HFA) 108 (90 Base) MCG/ACT inhaler  [id number] Yes Inhale 2 puffs into the lungs every 6 (six) hours as needed for wheezing or shortness of breath. Hassell Done Mary-Margaret, FNP Taking Active   ALPRAZolam Duanne Moron) 0.25 MG tablet [id number] Yes take 1 tablet by mouth twice a day if needed Marin Olp, MD Taking Active   amLODipine (NORVASC) 2.5 MG tablet [id number] Yes TAKE 1 TABLET BY MOUTH EVERY DAY Marin Olp, MD Taking Active  aspirin 81 MG tablet [id number] Yes Take 81 mg by mouth daily. [provider] Taking Active   escitalopram (LEXAPRO) 20 MG tablet [id number] Yes TAKE 1 TABLET BY MOUTH EVERY DAY Marin Olp, MD Taking Active   simvastatin (ZOCOR) 20 MG tablet [id number] Yes TAKE 1 TABLET DAILY AT 6 PM*NEED TO CALL OUR OFFICE TO SCHEDULE A APPOINTMENT FOR FURTHER REFILLS Marin Olp, MD Taking Active             Patient Active Problem List   Diagnosis Date Noted   Osteopenia 03/27/2019   Morbid obesity ([place]) 04/19/2017   Hx of adenomatous polyp of colon 04/30/2016   Allergic rhinitis 12/28/2014   Gout 12/28/2014   Hyperglycemia 08/31/2013   Hyperlipidemia 09/30/2009   Depression 05/22/2007   Essential hypertension 05/22/2007    Immunization History  Administered Date(s) Administered   Fluad Quad(high Dose 65+) 08/01/2019, 08/19/2020   Influenza Whole 08/13/2009   Influenza, High Dose Seasonal PF 09/06/2016, 08/05/2017, 08/09/2018   Influenza-Unspecified 07/11/2013, 08/06/2015   PFIZER(Purple Top)SARS-COV-2 Vaccination 10/31/2019, 11/18/2019   Pneumococcal Conjugate-13 12/12/2014   Pneumococcal Polysaccharide-23 02/04/2016   Td 08/11/2000    Conditions to be addressed/monitored:  HTN, Osteopenia, HLD, Depression  There are no care plans that you recently modified to display for this patient.    Medication Assistance: {MEDASSISTANCEINFO:25044}  Compliance/Adherence/Medication fill history: Care Gaps: ***  Star-Rating Drugs: ***  Patient's preferred pharmacy  is:  [id number] [place], [place] - [id number]: [phone]: [phone] [id number] [place]: [phone]: [phone] pill box? {Yes or If no, why not?:20788} Pt endorses ***% compliance  We discussed: {Pharmacy options:24294} Patient decided to: {US Pharmacy Plan:23885}  Care Plan and Follow Up Patient Decision:  {FOLLOWUP:24991}  Plan: {CM FOLLOW UP PLXKGY:18563}***  Current Barriers:  {pharmacybarriers:24917}  Pharmacist Clinical Goal(s):  Patient will {PHARMACYGOALCHOICES:24921} through collaboration with PharmD and provider.   Interventions: 1:1 collaboration with HuMarin OlpMD regarding development and update of comprehensive plan of care as evidenced by provider attestation and co-signature Inter-disciplinary care team collaboration (see longitudinal plan of care) Comprehensive medication review performed; medication list updated in electronic medical record  Hypertension (BP goal {CHL HP UPSTREAM Pharmacist BP [phone]}) -{US controlled/uncontrolled:25276} -Current treatment: Amlodipine 2.71m26maily -Medications previously tried: ***  -Current home readings: *** -Current dietary habits: *** -Current exercise habits: *** -{ACTIONS;DENIES/REPORTS:21021675::"Denies"} hypotensive/hypertensive symptoms -Educated on {CCM BP Counseling:25124} -Counseled to monitor BP at home ***, document, and provide log at future appointments -{CCMPHARMDINTERVENTION:25122}  Hyperlipidemia: (LDL goal < ***) -{US controlled/uncontrolled:25276} -Current treatment: Simvastatin 81m87mily -Medications previously tried: ***  -Current dietary patterns: *** -Current exercise habits: *** -Educated on {CCM HLD  Counseling:25126} -{CCMPHARMDINTERVENTION:25122}  Depression(Goal: ***) -{US controlled/uncontrolled:25276} -Current treatment: Escitalopram 81mg69mly -Medications previously tried/failed: *** -PHQ9: *** -GAD7: *** -Connected with *** for mental health support -Educated on {CCM mental health counseling:25127} -{CCMPHARMDINTERVENTION:25122}   Osteopenia (Goal ***) -{US controlled/uncontrolled:25276} -Last DEXA Scan: ***   T-Score femoral neck: ***  T-Score total hip: ***  T-Score lumbar spine: ***  T-Score forearm radius: ***  10-year probability of major osteoporotic fracture: ***  10-year probability of hip fracture: *** -Patient {is;is not an osteoporosis candidate:23886} -Current treatment  *** -Medications previously tried: ***  -{Osteoporosis Counseling:23892} -{CCMPHARMDINTERVENTION:25122}  Patient Goals/Self-Care Activities Patient will:  - {pharmacypatientgoals:24919}  Follow Up Plan: {  CM FOLLOW UP HWKG:88110}

## 2021-07-16 ENCOUNTER — Other Ambulatory Visit: Payer: Self-pay | Admitting: Family Medicine

## 2021-07-20 ENCOUNTER — Telehealth: Payer: Medicare Other

## 2021-07-24 NOTE — Telephone Encounter (Signed)
Patient would like a call back to reschedule her last appt.

## 2021-07-30 ENCOUNTER — Other Ambulatory Visit: Payer: Self-pay | Admitting: Family Medicine

## 2021-07-31 NOTE — Progress Notes (Signed)
Phone [phone] Virtual visit via Video note   Subjective:  Chief complaint: Chief Complaint  Patient presents with   Medication Refill    Discuss amlodipine & simvastatin refill. She needs refills & is still concerned about drug interactions.   Also needs Xanax refilled.   This visit type was conducted due to national recommendations for restrictions regarding the COVID-19 Pandemic (e.g. social distancing).  This format is felt to be most appropriate for this patient at this time balancing risks to patient and risks to population by having him in for in person visit.  No physical exam was performed (except for noted visual exam or audio findings with Telehealth visits).    Our team/I connected with Cynthia Berry at 11:20 AM EDT by a video enabled telemedicine application (doxy.me or caregility through epic) and verified that I am speaking with the correct person using two identifiers.  Location patient: [place] Location provider: [facility], office Persons participating in the virtual visit:  patient  Our team/I discussed the limitations of evaluation and management by telemedicine and the availability of in person appointments. In light of current covid-19 pandemic, patient also understands that we are trying to protect them by minimizing in office contact if at all possible.  The patient expressed consent for telemedicine visit and agreed to proceed. Patient understands insurance will be billed.   Past Medical History-  Patient Active Problem List   Diagnosis Date Noted   Hx of adenomatous polyp of colon 04/30/2016    Priority: 2.   Gout 12/28/2014    Priority: 2.   Hyperlipidemia 09/30/2009    Priority: 2.   Depression 05/22/2007    Priority: 2.   Essential hypertension 05/22/2007    Priority: 2.   Allergic rhinitis 12/28/2014    Priority: 3.   Hyperglycemia 08/31/2013    Priority: 3.   Osteopenia 03/27/2019   Morbid obesity ([place]) 04/19/2017    Medications- reviewed  and updated Current Outpatient Medications  Medication Sig Dispense Refill   albuterol (VENTOLIN HFA) 108 (90 Base) MCG/ACT inhaler Inhale 2 puffs into the lungs every 6 (six) hours as needed for wheezing or shortness of breath. 8 g 0   amLODipine (NORVASC) 2.5 MG tablet TAKE 1 TABLET BY MOUTH EVERY DAY 90 tablet 3   aspirin 81 MG tablet Take 81 mg by mouth daily.     escitalopram (LEXAPRO) 20 MG tablet TAKE 1 TABLET BY MOUTH EVERY DAY 90 tablet 3   simvastatin (ZOCOR) 20 MG tablet TAKE 1 TABLET DAILY AT 6 PM*NEED TO CALL OUR OFFICE TO SCHEDULE A APPOINTMENT FOR FURTHER REFILLS 90 tablet 0   ALPRAZolam (XANAX) 0.25 MG tablet Do not drive for 8 hours after use. take 1 tablet by mouth twice a day if needed 40 tablet 0   No current facility-administered medications for this visit.     Objective:  BP 126/69   Ht 5\' 1"  (1.549 m)   Wt 216 lb (98 kg)   LMP  (LMP Unknown)   BMI 40.81 kg/m  self reported vitals Gen: NAD, resting comfortably Lungs: nonlabored, normal respiratory rate  Skin: appears dry, no obvious rash    Assessment and Plan   #social update- joined [employer] and going to work on getting a routine. Also considering stretch zone   #hypertension/gout S: compliant with amlodipine 2.5 mg every day -Luckily no gout flares since making change Home readings #s: highest she gets at home is 131/71 typically BP Readings from Last 3 Encounters:  08/07/21 126/69  12/11/20 124/78  08/28/19 126/79  A/P: Controlled. Continue current medications.  -hold off on uric acid given no gout flares off hctz  #hyperlipidemia S: Medication:simvastatin 20 mg daily  Lab Results  Component Value Date   CHOL 158 08/31/2019   HDL 55.30 08/31/2019   LDLCALC 81 08/31/2019   LDLDIRECT 104.0 05/16/2018   TRIG 108.0 08/31/2019   CHOLHDL 3 08/31/2019   A/P: lipids slightly above ideal goal but with prediabetes risk hesitant to increase strength (f we increased would need to switch from simvastatin  as well due to amlodipine dose ideally) -also on aspirin 81 mg- no history of heart attack or stroke- said she could cut this out- prefers to take 2-3x a week  # Depression/anxiety S: well controlled on escitalopram 20mg  every day Depression screen [facility] 2/9 08/07/2021 12/11/2020 10/24/2020  Decreased Interest 0 0 0  Down, Depressed, Hopeless 0 0 0  PHQ - 2 Score 0 0 0  Altered sleeping 1 - 0  Tired, decreased energy 0 - 0  Change in appetite 0 - 0  Feeling bad or failure about yourself  0 - 0  Trouble concentrating 0 - 0  Moving slowly or fidgety/restless 0 - 0  Suicidal thoughts 0 - 0  PHQ-9 Score 1 - 0  Difficult doing work/chores Not difficult at all - -  A/P: full remission- continue current meds (hold off on reducing lexapro as still needing xanax though sparingly)  -some anxiety and requests alprazolam refill- sparing alprazolam 0.25 mg (#40 lasted since January)  # Hyperglycemia/insulin resistance/prediabetes/morbid obesity S: no meds Exercise and diet- see above- trying to get going with [employer]. Weight [place]bale over last 2 years largely.  Lab Results  Component Value Date   HGBA1C 5.6 08/31/2019   HGBA1C 5.8 05/16/2018   HGBA1C 5.8 06/14/2017   A/P: hopefully stable- update a1c when comes back for labs.no meds for now  #osteopenia- last 2020- she will ask breast center to add when has mammogram  Recommended follow up: keep march visit in person Future Appointments  Date Time Provider [place]  12/17/2021 11:00 AM [facility] HEALTH COACH [facility]  01/07/2022  1:00 PM Marin Olp, MD [facility]   Lab/Order associations:   ICD-10-CM   1. Essential hypertension  I10 Comprehensive metabolic panel    CBC with Differential/Platelet    Lipid panel    2. Hyperlipidemia, unspecified hyperlipidemia type  E78.5 Comprehensive metabolic panel    CBC with Differential/Platelet    Lipid panel    3. Major depressive disorder with single episode, in full remission  ([place])  F32.5     4. Hyperglycemia  R73.9 Hemoglobin A1c    5. Osteopenia of neck of right femur  M85.851 DG Bone Density    6. Chronic gout without tophus, unspecified cause, unspecified site  M1A.9XX0       Meds ordered this encounter  Medications   ALPRAZolam (XANAX) 0.25 MG tablet    Sig: Do not drive for 8 hours after use. take 1 tablet by mouth twice a day if needed    Dispense:  40 tablet    Refill:  0    I,Jada Bradford,acting as a scribe for Garret Reddish, MD.,have documented all relevant documentation on the behalf of Garret Reddish, MD,as directed by  Garret Reddish, MD while in the presence of Garret Reddish, MD.  I, Garret Reddish, MD, have reviewed all documentation for this visit. The documentation on 08/07/21 for the exam, diagnosis, procedures,  and orders are all accurate and complete.  Return precautions advised.  Garret Reddish, MD

## 2021-08-07 ENCOUNTER — Other Ambulatory Visit: Payer: Self-pay | Admitting: Family Medicine

## 2021-08-07 ENCOUNTER — Encounter: Payer: Self-pay | Admitting: Family Medicine

## 2021-08-07 ENCOUNTER — Telehealth (INDEPENDENT_AMBULATORY_CARE_PROVIDER_SITE_OTHER): Payer: Medicare Other | Admitting: Family Medicine

## 2021-08-07 VITALS — BP 126/69 | Ht 61.0 in | Wt 216.0 lb

## 2021-08-07 DIAGNOSIS — I10 Essential (primary) hypertension: Secondary | ICD-10-CM | POA: Diagnosis not present

## 2021-08-07 DIAGNOSIS — R739 Hyperglycemia, unspecified: Secondary | ICD-10-CM | POA: Diagnosis not present

## 2021-08-07 DIAGNOSIS — M85851 Other specified disorders of bone density and structure, right thigh: Secondary | ICD-10-CM

## 2021-08-07 DIAGNOSIS — F325 Major depressive disorder, single episode, in full remission: Secondary | ICD-10-CM

## 2021-08-07 DIAGNOSIS — M1A9XX0 Chronic gout, unspecified, without tophus (tophi): Secondary | ICD-10-CM | POA: Diagnosis not present

## 2021-08-07 DIAGNOSIS — E785 Hyperlipidemia, unspecified: Secondary | ICD-10-CM | POA: Diagnosis not present

## 2021-08-07 DIAGNOSIS — Z1231 Encounter for screening mammogram for malignant neoplasm of breast: Secondary | ICD-10-CM

## 2021-08-07 MED ORDER — ALPRAZOLAM 0.25 MG PO TABS: ORAL_TABLET | ORAL | 0 refills | Status: DC

## 2021-08-07 MED ORDER — SIMVASTATIN 20 MG PO TABS: ORAL_TABLET | ORAL | 3 refills | Status: DC

## 2021-08-25 ENCOUNTER — Other Ambulatory Visit (HOSPITAL_BASED_OUTPATIENT_CLINIC_OR_DEPARTMENT_OTHER): Payer: Self-pay

## 2021-08-25 DIAGNOSIS — Z23 Encounter for immunization: Secondary | ICD-10-CM | POA: Diagnosis not present

## 2021-08-25 MED ORDER — INFLUENZA VAC A&B SA ADJ QUAD 0.5 ML IM PRSY
PREFILLED_SYRINGE | INTRAMUSCULAR | 0 refills | Status: DC
Start: 2021-08-25 — End: 2022-01-26
  Filled 2021-08-25: qty 0.5, 1d supply, fill #0

## 2021-10-14 ENCOUNTER — Ambulatory Visit
Admission: RE | Admit: 2021-10-14 | Discharge: 2021-10-14 | Disposition: A | Discharge status: Home / Self Care | Payer: Medicare Other | Source: Ambulatory Visit | Attending: Family Medicine | Admitting: Family Medicine

## 2021-10-14 ENCOUNTER — Other Ambulatory Visit: Payer: Self-pay

## 2021-10-14 DIAGNOSIS — Z1231 Encounter for screening mammogram for malignant neoplasm of breast: Secondary | ICD-10-CM | POA: Diagnosis not present

## 2021-10-16 ENCOUNTER — Other Ambulatory Visit: Payer: Self-pay | Admitting: Family Medicine

## 2021-10-16 DIAGNOSIS — Z1231 Encounter for screening mammogram for malignant neoplasm of breast: Secondary | ICD-10-CM

## 2021-10-20 ENCOUNTER — Other Ambulatory Visit: Payer: Self-pay | Admitting: Family Medicine

## 2021-10-20 DIAGNOSIS — Z1231 Encounter for screening mammogram for malignant neoplasm of breast: Secondary | ICD-10-CM

## 2021-10-20 DIAGNOSIS — L300 Nummular dermatitis: Secondary | ICD-10-CM | POA: Diagnosis not present

## 2021-10-21 ENCOUNTER — Other Ambulatory Visit: Payer: Self-pay

## 2021-10-21 ENCOUNTER — Other Ambulatory Visit: Payer: Medicare Other

## 2021-10-21 ENCOUNTER — Encounter: Payer: Self-pay | Admitting: Family Medicine

## 2021-10-21 ENCOUNTER — Ambulatory Visit (INDEPENDENT_AMBULATORY_CARE_PROVIDER_SITE_OTHER): Payer: Medicare Other | Admitting: Family Medicine

## 2021-10-21 VITALS — BP 130/88 | HR 83 | Temp 98.8°F | Ht 61.0 in | Wt 213.5 lb

## 2021-10-21 DIAGNOSIS — R739 Hyperglycemia, unspecified: Secondary | ICD-10-CM

## 2021-10-21 DIAGNOSIS — I10 Essential (primary) hypertension: Secondary | ICD-10-CM | POA: Diagnosis not present

## 2021-10-21 DIAGNOSIS — E785 Hyperlipidemia, unspecified: Secondary | ICD-10-CM

## 2021-10-21 DIAGNOSIS — N898 Other specified noninflammatory disorders of vagina: Secondary | ICD-10-CM | POA: Diagnosis not present

## 2021-10-21 MED ORDER — TRIAMCINOLONE ACETONIDE 0.1 % EX CREA: TOPICAL_CREAM | Freq: Two times a day (BID) | CUTANEOUS | 1 refills | Status: DC

## 2021-10-21 NOTE — Progress Notes (Signed)
Subjective:     Patient ID: Cynthia Berry, female    DOB: [birth year], 76 y.o.   MRN: [id number]  Chief Complaint  Patient presents with   Vaginal Itching    Has been going on for a long time off and on Has used topical itch cream on the outside of vagina    Bloodwork    Complete blood work previously ordered by Dr. Yong Channel    HPI  Vag itching-long time(years).  Possibly started since wearing incont pads.  Itchy.  Using OTC "jock itch" cream. Helps some but uses intermitt.  No d/c/odor/pain.  No dryer sheets.  Using Dial soap  Incont very mild.    Health Maintenance Due  Topic Date Due   Zoster Vaccines- Shingrix (1 of 2) Never done   TETANUS/TDAP  08/11/2010   COVID-19 Vaccine (3 - Booster for Pfizer series) 01/13/2020    Past Medical History:  Diagnosis Date   Anemia    Anxiety    Pt very anxious/sensitive to certain meds!   COVID 2022   Depression    Hx of adenomatous polyp of colon 04/30/2016   Hyperlipidemia    Hypertension    Post-operative nausea and vomiting    due to nerves    Past Surgical History:  Procedure Laterality Date   TONSILLECTOMY     as child    Outpatient Medications Prior to Visit  Medication Sig Dispense Refill   albuterol (VENTOLIN HFA) 108 (90 Base) MCG/ACT inhaler Inhale 2 puffs into the lungs every 6 (six) hours as needed for wheezing or shortness of breath. 8 g 0   ALPRAZolam (XANAX) 0.25 MG tablet Do not drive for 8 hours after use. take 1 tablet by mouth twice a day if needed 40 tablet 0   amLODipine (NORVASC) 2.5 MG tablet TAKE 1 TABLET BY MOUTH EVERY DAY 90 tablet 3   aspirin 81 MG tablet Take 81 mg by mouth daily.     escitalopram (LEXAPRO) 20 MG tablet TAKE 1 TABLET BY MOUTH EVERY DAY 90 tablet 3   influenza vaccine adjuvanted (FLUAD) 0.5 ML injection Inject into the muscle. 0.5 mL 0   simvastatin (ZOCOR) 20 MG tablet TAKE 1 TABLET DAILY AT 6 PM*NEED TO CALL OUR OFFICE TO SCHEDULE A APPOINTMENT FOR FURTHER REFILLS 90 tablet 3    triamcinolone cream (KENALOG) 0.1 % Apply topically.     No facility-administered medications prior to visit.    Allergies  Allergen Reactions   Doxycycline    Penicillins     REACTION: swelling,rash   Sulfa Antibiotics     Per pt, no reaction to Sulfa,but you avoid these meds.   YWV:PXTGGYIR/[id number] except as noted in HPI      Objective:     BP 130/88    Pulse 83    Temp 98.8 F (37.1 C) (Temporal)    Ht 5\' 1"  (1.549 m)    Wt 213 lb 8 oz (96.8 kg)    LMP  (LMP Unknown)    SpO2 99%    BMI 40.34 kg/m  Wt Readings from Last 3 Encounters:  10/21/21 213 lb 8 oz (96.8 kg)  08/07/21 216 lb (98 kg)  12/11/20 207 lb 12.8 oz (94.3 kg)        Gen: WDWN NAD MOWF HEENT: NCAT, conjunctiva not injected, sclera nonicteric EXT:  no edema MSK: no gross abnormalities.  NEURO: A&O x3.  CN II-XII intact.  PSYCH: normal mood. Good eye contact GU-chaperone QJ present.  Some  mild irrit vulva.  Introits-scant, white, runny d/c.  Vag-dry.  No d/c in vault.  Assessment & Plan:   Problem List Items Addressed This Visit       Cardiovascular and Mediastinum   Essential hypertension     Other   Hyperlipidemia   Hyperglycemia   Other Visit Diagnoses     Vaginal itching    -  Primary      Vaginal itching-?contact dermatitis from pads/soaps, ? Vaginal dryness.  Will do short course steroid cream d/t intense itch.  Stop dial soap.  Use Dove.  Try not using pads.  Can do desitin.  If continues, consider estrogen cream.   Other dx, used for lab slip only-not addressed  Meds ordered this encounter  Medications   triamcinolone cream (KENALOG) 0.1 %    Sig: Apply topically 2 (two) times daily.    Dispense:  30 g    Refill:  1    Wellington Hampshire, MD

## 2021-10-21 NOTE — Patient Instructions (Addendum)
Dove soap Try limited pads.    Let me know if still bothersome

## 2021-10-22 LAB — COMPREHENSIVE METABOLIC PANEL
ALT: 12 U/L (ref 0–35)
AST: 16 U/L (ref 0–37)
Albumin: 3.9 g/dL (ref 3.5–5.2)
Alkaline Phosphatase: 54 U/L (ref 39–117)
BUN: 13 mg/dL (ref 6–23)
CO2: 30 mEq/L (ref 19–32)
Calcium: 9.6 mg/dL (ref 8.4–10.5)
Chloride: 103 mEq/L (ref 96–112)
Creatinine, Ser: 0.62 mg/dL (ref 0.40–1.20)
GFR: 87.48 mL/min (ref >60.00)
Glucose, Bld: 100 mg/dL — ABNORMAL HIGH (ref 70–99)
Potassium: 4 mEq/L (ref 3.5–5.1)
Sodium: 140 mEq/L (ref 135–145)
Total Bilirubin: 0.7 mg/dL (ref 0.2–1.2)
Total Protein: 7.1 g/dL (ref 6.0–8.3)

## 2021-10-22 LAB — CBC WITH DIFFERENTIAL/PLATELET
Basophils Absolute: 0.1 10*3/uL (ref 0.0–0.1)
Basophils Relative: 0.8 % (ref 0.0–3.0)
Eosinophils Absolute: 0 10*3/uL (ref 0.0–0.7)
Eosinophils Relative: 0.5 % (ref 0.0–5.0)
HCT: 38.2 % (ref 36.0–46.0)
Hemoglobin: 12.5 g/dL (ref 12.0–15.0)
Lymphocytes Relative: 28.9 % (ref 12.0–46.0)
Lymphs Abs: 2.6 10*3/uL (ref 0.7–4.0)
MCHC: 32.9 g/dL (ref 30.0–36.0)
MCV: 89 fl (ref 78.0–100.0)
Monocytes Absolute: 0.8 10*3/uL (ref 0.1–1.0)
Monocytes Relative: 9.3 % (ref 3.0–12.0)
Neutro Abs: 5.4 10*3/uL (ref 1.4–7.7)
Neutrophils Relative %: 60.5 % (ref 43.0–77.0)
Platelets: 291 10*3/uL (ref 150.0–400.0)
RBC: 4.29 Mil/uL (ref 3.87–5.11)
RDW: 13.3 % (ref 11.5–15.5)
WBC: 9 10*3/uL (ref 4.0–10.5)

## 2021-10-22 LAB — LIPID PANEL
Cholesterol: 152 mg/dL (ref 0–200)
HDL: 61.8 mg/dL (ref >39.00)
LDL Cholesterol: 74 mg/dL (ref 0–99)
NonHDL: 90.05
Total CHOL/HDL Ratio: 2
Triglycerides: 80 mg/dL (ref 0.0–149.0)
VLDL: 16 mg/dL (ref 0.0–40.0)

## 2021-10-22 LAB — HEMOGLOBIN A1C: Hgb A1c MFr Bld: 5.7 % (ref 4.6–6.5)

## 2021-12-16 NOTE — Progress Notes (Incomplete)
? ?Phone [phone] ?In person visit ?  ?Subjective:  ? ?Cynthia Berry is a 76 y.o. year old very pleasant female patient who presents for/with See problem oriented charting ?No chief complaint on file. ? ? ?This visit occurred during the SARS-CoV-2 public health emergency.  Safety protocols were in place, including screening questions prior to the visit, additional usage of staff PPE, and extensive cleaning of exam room while observing appropriate contact time as indicated for disinfecting solutions.  ? ?Past Medical History-  ?Patient Active Problem List  ? Diagnosis Date Noted  ? Osteopenia 03/27/2019  ? Morbid obesity ([place]) 04/19/2017  ? Hx of adenomatous polyp of colon 04/30/2016  ? Allergic rhinitis 12/28/2014  ? Gout 12/28/2014  ? Hyperglycemia 08/31/2013  ? Hyperlipidemia 09/30/2009  ? Depression 05/22/2007  ? Essential hypertension 05/22/2007  ? ? ?Medications- reviewed and updated ?Current Outpatient Medications  ?Medication Sig Dispense Refill  ? albuterol (VENTOLIN HFA) 108 (90 Base) MCG/ACT inhaler Inhale 2 puffs into the lungs every 6 (six) hours as needed for wheezing or shortness of breath. 8 g 0  ? ALPRAZolam (XANAX) 0.25 MG tablet Do not drive for 8 hours after use. take 1 tablet by mouth twice a day if needed 40 tablet 0  ? amLODipine (NORVASC) 2.5 MG tablet TAKE 1 TABLET BY MOUTH EVERY DAY 90 tablet 3  ? aspirin 81 MG tablet Take 81 mg by mouth daily.    ? escitalopram (LEXAPRO) 20 MG tablet TAKE 1 TABLET BY MOUTH EVERY DAY 90 tablet 3  ? influenza vaccine adjuvanted (FLUAD) 0.5 ML injection Inject into the muscle. 0.5 mL 0  ? simvastatin (ZOCOR) 20 MG tablet TAKE 1 TABLET DAILY AT 6 PM*NEED TO CALL OUR OFFICE TO SCHEDULE A APPOINTMENT FOR FURTHER REFILLS 90 tablet 3  ? triamcinolone cream (KENALOG) 0.1 % Apply topically 2 (two) times daily. 30 g 1  ? ?No current facility-administered medications for this visit.  ? ?  ?Objective:  ?LMP  (LMP Unknown)  ?Gen: NAD, resting comfortably ?CV: RRR no  murmurs rubs or gallops ?Lungs: CTAB no crackles, wheeze, rhonchi ?Abdomen: soft/nontender/nondistended/normal bowel sounds. No rebound or guarding.  ?Ext: no edema ?Skin: warm, dry ?Neuro: grossly normal, moves all extremities ? ?*** ?  ? ?Assessment and Plan  ? ?#social update- joined [employer] and going to work on getting a routine. Also considered stretch zone ?  ?#hypertension/gout ?S: compliant with amlodipine 2.5 mg every day ?-Luckily no gout flares since making change ?Home readings #s: *** ?BP Readings from Last 3 Encounters:  ?10/21/21 130/88  ?08/07/21 126/69  ?12/11/20 124/78  ? ?Lab Results  ?Component Value Date  ? LABURIC 6.4 08/31/2019  ? ?A/P:***  ? ?#hyperlipidemia ?S: Medication:simvastatin 20 mg daily  ?Lab Results  ?Component Value Date  ? CHOL 152 10/21/2021  ? HDL 61.80 10/21/2021  ? [place] 74 10/21/2021  ? LDLDIRECT 104.0 05/16/2018  ? TRIG 80.0 10/21/2021  ? CHOLHDL 2 10/21/2021  ? A/P: *** ?  ?# Depression/anxiety ?S: well controlled on escitalopram '20mg'$  every day ?Depression screen [facility] 2/9 08/07/2021 12/11/2020 10/24/2020  ?Decreased Interest 0 0 0  ?Down, Depressed, Hopeless 0 0 0  ?PHQ - 2 Score 0 0 0  ?Altered sleeping 1 - 0  ?Tired, decreased energy 0 - 0  ?Change in appetite 0 - 0  ?Feeling bad or failure about yourself  0 - 0  ?Trouble concentrating 0 - 0  ?Moving slowly or fidgety/restless 0 - 0  ?Suicidal thoughts 0 -  0  ?PHQ-9 Score 1 - 0  ?Difficult doing work/chores Not difficult at all - -  ? ?A/P: *** ? ?# Hyperglycemia/insulin resistance/prediabetes/morbid obesity ?S: none ?Exercise and diet- *** ?Lab Results  ?Component Value Date  ? HGBA1C 5.7 10/21/2021  ? HGBA1C 5.6 08/31/2019  ? HGBA1C 5.8 05/16/2018  ? ? A/P: *** ? ?#osteopenia- last 2020- she will ask breast center to add when had mammogram ? ?Health Maintenance Due  ?Topic Date Due  ? Zoster Vaccines- Shingrix (1 of 2) Never done  ? TETANUS/TDAP  08/11/2010  ? COVID-19 Vaccine (3 - Booster for Pfizer series) 01/13/2020   ? ?Recommended follow up: No follow-ups on file. ?Future Appointments  ?Date Time Provider [place]  ?12/17/2021 11:00 AM [facility] HEALTH COACH [facility]  ?01/06/2022  2:00 PM [facility] DX DEXA 1 [facility]  ?01/07/2022  1:00 PM Marin Olp, MD [facility]  ?10/20/2022  1:30 PM [facility] MM 2 [facility]  ? ? ?Lab/Order associations: ?No diagnosis found. ? ?No orders of the defined types were placed in this encounter. ? ?I,Jada Bradford,acting as a scribe for Garret Reddish, MD.,have documented all relevant documentation on the behalf of Garret Reddish, MD,as directed by  Garret Reddish, MD while in the presence of Garret Reddish, MD. ? ?*** ? ?Return precautions advised.  ?Burnett Corrente ? ? ?

## 2021-12-17 ENCOUNTER — Ambulatory Visit: Payer: Medicare Other

## 2022-01-04 ENCOUNTER — Encounter: Payer: Self-pay | Admitting: Family Medicine

## 2022-01-04 ENCOUNTER — Ambulatory Visit: Payer: Medicare Other

## 2022-01-04 ENCOUNTER — Other Ambulatory Visit: Payer: Self-pay

## 2022-01-04 DIAGNOSIS — U071 COVID-19: Secondary | ICD-10-CM | POA: Diagnosis not present

## 2022-01-04 NOTE — Progress Notes (Signed)
? ?Phone [phone] ?In person visit ?  ?Subjective:  ? ?Cynthia Berry is a 76 y.o. year old very pleasant female patient who presents for/with See problem oriented charting ?Chief Complaint  ?Patient presents with  ? Follow-up  ? ? ?This visit occurred during the SARS-CoV-2 public health emergency.  Safety protocols were in place, including screening questions prior to the visit, additional usage of staff PPE, and extensive cleaning of exam room while observing appropriate contact time as indicated for disinfecting solutions.  ? ?Past Medical History-  ?Patient Active Problem List  ? Diagnosis Date Noted  ? Hx of adenomatous polyp of colon 04/30/2016  ?  Priority: Medium   ? Gout 12/28/2014  ?  Priority: Medium   ? Hyperlipidemia 09/30/2009  ?  Priority: Medium   ? Depression 05/22/2007  ?  Priority: Medium   ? Essential hypertension 05/22/2007  ?  Priority: Medium   ? Allergic rhinitis 12/28/2014  ?  Priority: Low  ? Hyperglycemia 08/31/2013  ?  Priority: Low  ? Osteopenia 03/27/2019  ? Morbid obesity ([place]) 04/19/2017  ? ? ?Medications- reviewed and updated ?Current Outpatient Medications  ?Medication Sig Dispense Refill  ? ALPRAZolam (XANAX) 0.25 MG tablet Do not drive for 8 hours after use. take 1 tablet by mouth twice a day if needed 40 tablet 0  ? amLODipine (NORVASC) 2.5 MG tablet TAKE 1 TABLET BY MOUTH EVERY DAY 90 tablet 3  ? rosuvastatin (CRESTOR) 5 MG tablet Take 1 tablet (5 mg total) by mouth daily. 90 tablet 3  ? triamcinolone cream (KENALOG) 0.1 % Apply topically 2 (two) times daily. 30 g 1  ? albuterol (VENTOLIN HFA) 108 (90 Base) MCG/ACT inhaler Inhale 2 puffs into the lungs every 6 (six) hours as needed for wheezing or shortness of breath. (Patient not taking: Reported on 01/26/2022) 8 g 0  ? aspirin 81 MG tablet Take 81 mg by mouth daily. (Patient not taking: Reported on 01/26/2022)    ? escitalopram (LEXAPRO) 20 MG tablet TAKE 1 TABLET BY MOUTH EVERY DAY (Patient not taking: Reported on 01/26/2022)  90 tablet 3  ? ?No current facility-administered medications for this visit.  ? ?  ?Objective:  ?BP 134/70   Pulse 89   Temp 98 ?F (36.7 ?C)   Ht 5' 1.5" (1.562 m)   Wt 213 lb 9.6 oz (96.9 kg)   LMP  (LMP Unknown)   SpO2 96%   BMI 39.71 kg/m?  ?Gen: NAD, resting comfortably ?CV: RRR no murmurs rubs or gallops ?Lungs: CTAB no crackles, wheeze, rhonchi ?Abdomen: soft/nontender/nondistended/normal bowel sounds. No rebound or guarding.  ?Ext: no edema ?Skin: warm, dry ?Neuro: grossly normal, moves all extremities ? ?  ? ?Assessment and Plan  ? ?#social update- joined [employer] prior to last visit and getting in limited amount- encouraged her to increase- has taken last month off but wants to get back to twice a week ?  ?#hypertension/gout ?S: compliant with amlodipine 2.5 mg every day ?-Luckily no gout flares since making change off diuretic ?BP Readings from Last 3 Encounters:  ?01/26/22 134/70  ?10/21/21 130/88  ?08/07/21 126/69  ? ?Lab Results  ?Component Value Date  ? LABURIC 6.4 08/31/2019  ? ?A/P:gout no recurrence off diuretic ? ?Hypertension- Controlled. Continue current medications.    ? ?#hyperlipidemia ?S: Medication:simvastatin 20 mg daily. She has been concerned about simvastatin along with amlodipine due to something she had read- we had explained with her doses no issues but she prefers to change ?Lab  Results  ?Component Value Date  ? CHOL 152 10/21/2021  ? HDL 61.80 10/21/2021  ? [place] 74 10/21/2021  ? LDLDIRECT 104.0 05/16/2018  ? TRIG 80.0 10/21/2021  ? CHOLHDL 2 10/21/2021  ? A/P: close to ideal control - but she would like to change med- try rosuvastatin 5 mg instead with no potential amlodipine interaction.  ?  ?# Depression/anxiety ?S: well controlled on escitalopram '20mg'$  every day with situational use of alprazolam- hasnt taken much since January- maybe twice ? ?  01/26/2022  ? 10:49 AM 08/07/2021  ? 11:28 AM 12/11/2020  ? 11:20 AM  ?Depression screen PHQ 2/9  ?Decreased Interest 0 0 0   ?Down, Depressed, Hopeless 0 0 0  ?PHQ - 2 Score 0 0 0  ?Altered sleeping  1   ?Tired, decreased energy  0   ?Change in appetite  0   ?Feeling bad or failure about yourself   0   ?Trouble concentrating  0   ?Moving slowly or fidgety/restless  0   ?Suicidal thoughts  0   ?PHQ-9 Score  1   ?Difficult doing work/chores  Not difficult at all   ?A/P: full remission for depression- reports good control of anxiety- no SI- continue current medicine ?-very sparing alprazolam and no falls- can continue current meds ?  ?# Hyperglycemia/insulin resistance/prediabetes/morbid obesity ?S: Medication: no meds ?Exercise and diet- needs to increase exercise. Trying to eat healthier diet and down 4 lbs.  ?Lab Results  ?Component Value Date  ? HGBA1C 5.7 10/21/2021  ? HGBA1C 5.6 08/31/2019  ? HGBA1C 5.8 05/16/2018  ? A/P: stable on recent check- continue current medicine and work on lifestyle ? ?BMI> 40 with HLD and HTN- Encouraged need for healthy eating, regular exercise, weight loss. - down 3 lbs from last visit ?  ?#osteopenia- last 2020 very mild osteopenia- remain off meds. Focus on weight bearing exercise like walking. Consider repeat 3-5 years.  ? ?#HM_ advised tdap at pharmacy and shingrix. She declines covid 19   ? ?Recommended follow up: No follow-ups on file. ?Future Appointments  ?Date Time Provider [place]  ?10/20/2022  1:30 PM [facility] MM 2 [facility]  ? ?Lab/Order associations: ?  ICD-10-CM   ?1. Essential hypertension  I10   ?  ?2. Hyperlipidemia, unspecified hyperlipidemia type  E78.5   ?  ?3. Hyperglycemia  R73.9   ?  ?4. Major depressive disorder with single episode, in full remission ([place])  F32.5   ?  ?5. Chronic gout without tophus, unspecified cause, unspecified site  M1A.9XX0   ?  ?6. Morbid obesity ([place]) Chronic E66.01   ?  ? ?Meds ordered this encounter  ?Medications  ? rosuvastatin (CRESTOR) 5 MG tablet  ?  Sig: Take 1 tablet (5 mg total) by mouth daily.  ?  Dispense:  90 tablet  ?  Refill:   3  ? ? ?Return precautions advised.  ?Garret Reddish, MD ? ? ?

## 2022-01-06 ENCOUNTER — Ambulatory Visit
Admission: RE | Admit: 2022-01-06 | Discharge: 2022-01-06 | Disposition: A | Discharge status: Home / Self Care | Payer: Medicare Other | Source: Ambulatory Visit | Attending: Family Medicine | Admitting: Family Medicine

## 2022-01-06 ENCOUNTER — Encounter: Payer: Self-pay | Admitting: Family Medicine

## 2022-01-06 DIAGNOSIS — Z780 Asymptomatic menopausal state: Secondary | ICD-10-CM | POA: Diagnosis not present

## 2022-01-06 DIAGNOSIS — M85851 Other specified disorders of bone density and structure, right thigh: Secondary | ICD-10-CM

## 2022-01-07 ENCOUNTER — Ambulatory Visit: Payer: Medicare Other | Admitting: Family Medicine

## 2022-01-07 DIAGNOSIS — E785 Hyperlipidemia, unspecified: Secondary | ICD-10-CM

## 2022-01-07 DIAGNOSIS — I10 Essential (primary) hypertension: Secondary | ICD-10-CM

## 2022-01-07 DIAGNOSIS — R739 Hyperglycemia, unspecified: Secondary | ICD-10-CM

## 2022-01-07 DIAGNOSIS — F325 Major depressive disorder, single episode, in full remission: Secondary | ICD-10-CM

## 2022-01-07 DIAGNOSIS — M1A9XX0 Chronic gout, unspecified, without tophus (tophi): Secondary | ICD-10-CM

## 2022-01-13 DIAGNOSIS — H5203 Hypermetropia, bilateral: Secondary | ICD-10-CM | POA: Diagnosis not present

## 2022-01-13 DIAGNOSIS — H2513 Age-related nuclear cataract, bilateral: Secondary | ICD-10-CM | POA: Diagnosis not present

## 2022-01-26 ENCOUNTER — Ambulatory Visit (INDEPENDENT_AMBULATORY_CARE_PROVIDER_SITE_OTHER): Payer: Medicare Other | Admitting: Family Medicine

## 2022-01-26 ENCOUNTER — Encounter: Payer: Self-pay | Admitting: Family Medicine

## 2022-01-26 VITALS — BP 134/70 | HR 89 | Temp 98.0°F | Ht 61.5 in | Wt 213.6 lb

## 2022-01-26 DIAGNOSIS — F325 Major depressive disorder, single episode, in full remission: Secondary | ICD-10-CM | POA: Diagnosis not present

## 2022-01-26 DIAGNOSIS — M1A9XX0 Chronic gout, unspecified, without tophus (tophi): Secondary | ICD-10-CM | POA: Diagnosis not present

## 2022-01-26 DIAGNOSIS — R739 Hyperglycemia, unspecified: Secondary | ICD-10-CM | POA: Diagnosis not present

## 2022-01-26 DIAGNOSIS — I10 Essential (primary) hypertension: Secondary | ICD-10-CM

## 2022-01-26 DIAGNOSIS — E785 Hyperlipidemia, unspecified: Secondary | ICD-10-CM | POA: Diagnosis not present

## 2022-01-26 MED ORDER — ROSUVASTATIN CALCIUM 5 MG PO TABS: 5.0000 mg | ORAL_TABLET | Freq: Every day | ORAL | 3 refills | Status: DC

## 2022-01-26 NOTE — Patient Instructions (Addendum)
Stop simvastatin 20 mg. Start rosuvastatin 5 mg daily ? ?No other changes- thrille dyou are doing well!  ? ?Recommended follow up: Return in about 6 months (around 07/28/2022) for followup or sooner if needed.Schedule b4 you leave. ?

## 2022-03-19 ENCOUNTER — Telehealth: Payer: Self-pay | Admitting: Family Medicine

## 2022-03-19 NOTE — Telephone Encounter (Signed)
Copied from [place] [phone]. Topic: Medicare AWV >> Mar 19, 2022  3:38 PM Devoria Glassing wrote: Reason for CRM: Left message for patient to schedule Annual Wellness Visit.  Please schedule with Nurse Health Advisor Charlott Rakes, RN at [facility].  Please call [phone] ask for [facility]

## 2022-03-22 ENCOUNTER — Ambulatory Visit: Payer: Medicare Other

## 2022-03-22 ENCOUNTER — Other Ambulatory Visit: Payer: Self-pay | Admitting: Family Medicine

## 2022-05-12 ENCOUNTER — Other Ambulatory Visit: Payer: Self-pay | Admitting: Family Medicine

## 2022-07-05 ENCOUNTER — Encounter: Payer: Self-pay | Admitting: *Deleted

## 2022-07-12 ENCOUNTER — Telehealth: Payer: Self-pay | Admitting: Family Medicine

## 2022-07-12 ENCOUNTER — Ambulatory Visit: Payer: Medicare Other | Admitting: *Deleted

## 2022-07-12 NOTE — Progress Notes (Signed)
Erroneous encounter

## 2022-07-12 NOTE — Telephone Encounter (Signed)
Patient states: -She was not called once again for her medicare well visit  - After 1st missed appointment she told scheduler to call her cell phone [phone], but doesn't think this was done this time around   I reached out to [place] via teams and informed her of pt message. Sharee Pimple verbalized understanding and stated she would call pt back.

## 2022-07-12 NOTE — Patient Instructions (Signed)

## 2022-07-16 ENCOUNTER — Ambulatory Visit (INDEPENDENT_AMBULATORY_CARE_PROVIDER_SITE_OTHER): Payer: Medicare Other | Admitting: *Deleted

## 2022-07-16 DIAGNOSIS — Z0000 Encounter for general adult medical examination without abnormal findings: Secondary | ICD-10-CM

## 2022-07-16 NOTE — Progress Notes (Signed)
Subjective:   Cynthia Berry is a 76 y.o. female who presents for Medicare Annual (Subsequent) preventive examination. I connected with  Cynthia Berry on 07/16/22 by a audio enabled telemedicine application and verified that I am speaking with the correct person using two identifiers.  Patient Location: Home  Provider Location: Home Office  I discussed the limitations of evaluation and management by telemedicine. The patient expressed understanding and agreed to proceed.  Review of Systems    Deferred to PCP Cardiac Risk Factors include: advanced age (>52mn, >>28women);dyslipidemia;hypertension     Objective:    There were no vitals filed for this visit. There is no height or weight on file to calculate BMI.     07/16/2022    2:55 PM 12/11/2020   11:22 AM 11/02/2019   10:43 AM 02/19/2016    1:43 PM  Advanced Directives  Does Patient Have a Medical Advance Directive? Yes Yes Yes Yes  Type of AParamed[place]of [place]antonLiving will Living will Living will;Healthcare Power of Attorney   Does patient want to make changes to medical advance directive? No - Patient declined  No - Patient declined   Copy of HPeytonin Chart? No - copy requested  No - copy requested No - copy requested    Current Medications (verified) Outpatient Encounter Medications as of 07/16/2022  Medication Sig   ALPRAZolam (XANAX) 0.25 MG tablet Do not drive for 8 hours after use. take 1 tablet by mouth twice a day if needed   amLODipine (NORVASC) 2.5 MG tablet TAKE 1 TABLET BY MOUTH EVERY DAY   escitalopram (LEXAPRO) 20 MG tablet TAKE 1 TABLET BY MOUTH EVERY DAY   rosuvastatin (CRESTOR) 5 MG tablet Take 1 tablet (5 mg total) by mouth daily.   triamcinolone cream (KENALOG) 0.1 % Apply topically 2 (two) times daily.   albuterol (VENTOLIN HFA) 108 (90 Base) MCG/ACT inhaler Inhale 2 puffs into the lungs every 6 (six) hours as needed for wheezing or shortness of breath. (Patient not  taking: Reported on 01/26/2022)   aspirin 81 MG tablet Take 81 mg by mouth daily. (Patient not taking: Reported on 01/26/2022)   No facility-administered encounter medications on file as of 07/16/2022.    Allergies (verified) Doxycycline, Penicillins, and Sulfa antibiotics   History: Past Medical History:  Diagnosis Date   Anemia    Anxiety    Pt very anxious/sensitive to certain meds!   COVID 2022   Depression    Hx of adenomatous polyp of colon 04/30/2016   Hyperlipidemia    Hypertension    Post-operative nausea and vomiting    due to nerves   Past Surgical History:  Procedure Laterality Date   TONSILLECTOMY     as child   Family History  Problem Relation Age of Onset   Stroke Mother 858  Heart disease Father 556      mi, smoker   Lung cancer Sister        1/2 sister-lung cancer   Heart disease Sister    Colon cancer Neg Hx    Esophageal cancer Neg Hx    Rectal cancer Neg Hx    Stomach cancer Neg Hx    Breast cancer Neg Hx    Social History   Socioeconomic History   Marital status: Widowed    Spouse name: Not on file   Number of children: 2   Years of education: college   Highest education level: Not on  file  Occupational History   Not on file  Tobacco Use   Smoking status: Never   Smokeless tobacco: Never  Vaping Use   Vaping Use: Never used  Substance and Sexual Activity   Alcohol use: Yes    Comment: about 1 drink per month   Drug use: No   Sexual activity: Never  Other Topics Concern   Not on file  Social History Narrative   Widowed 2008. 2 daughters-1 in [place] and 1 in [place]. 2 grandkids      Taught special education until daughters born   Cynthia Berry tto [employer]      Hobbies: dancing, cooking and family gatherings.    Social Determinants of Health   Financial Resource Strain: Low Risk  (07/16/2022)   Overall Financial Resource Strain (CARDIA)    Difficulty of Paying Living Expenses: Not hard at all  Food Insecurity: No Food Insecurity (07/16/2022)    Hunger Vital Sign    Worried About Running Out of Food in the Last Year: Never true    Ran Out of Food in the Last Year: Never true  Transportation Needs: No Transportation Needs (07/16/2022)   PRAPARE - Hydrologist (Medical): No    Lack of Transportation (Non-Medical): No  Physical Activity: Inactive (07/16/2022)   Exercise Vital Sign    Days of Exercise per Week: 0 days    Minutes of Exercise per Session: 0 min  Stress: No Stress Concern Present (07/16/2022)   [place]    Feeling of Stress : Not at all  Social Connections: Moderately Integrated (07/16/2022)   Social Connection and Isolation Panel [NHANES]    Frequency of Communication with Friends and Family: More than three times a week    Frequency of Social Gatherings with Friends and Family: More than three times a week    Attends Religious Services: More than 4 times per year    Active Member of [employer] or Organizations: Yes    Attends Archivist Meetings: More than 4 times per year    Marital Status: Widowed    Tobacco Counseling Counseling given: Not Answered   Clinical Intake:  Pre-visit preparation completed: Yes  Pain : No/denies pain     Nutritional Status: BMI > 30  Obese Nutritional Risks: None Diabetes: No  How often do you need to have someone help you when you read instructions, pamphlets, or other written materials from your doctor or pharmacy?: 1 - Never What is the last grade level you completed in school?: college  Diabetic?No  Interpreter Needed?: No  Information entered by :: Emelia Loron RN   Activities of Daily Living    07/16/2022    2:54 PM  In your present state of health, do you have any difficulty performing the following activities:  Hearing? 0  Vision? 0  Difficulty concentrating or making decisions? 0  Walking or climbing stairs? 0  Dressing or bathing? 0  Doing errands,  shopping? 0  Preparing Food and eating ? N  Using the Toilet? N  In the past six months, have you accidently leaked urine? N  Do you have problems with loss of bowel control? N  Managing your Medications? N  Managing your Finances? N  Housekeeping or managing your Housekeeping? N    Patient Care Team: Marin Olp, MD as PCP - General (Family Medicine) Madelin Rear, [facility] (Inactive) as Pharmacist (Pharmacist)  Indicate any recent Medical Services you  may have received from other than [facility] providers in the past year (date may be approximate).     Assessment:   This is a routine wellness examination for Lilah.  Hearing/Vision screen No results found.  Dietary issues and exercise activities discussed: Current Exercise Habits: The patient does not participate in regular exercise at present, Exercise limited by: orthopedic condition(s)   Goals Addressed             This Visit's Progress    Patient Stated       I want to lose weight.      Depression Screen    07/16/2022    3:05 PM 01/26/2022   10:49 AM 08/07/2021   11:28 AM 12/11/2020   11:20 AM 10/24/2020   12:49 PM 06/24/2020    2:00 PM 11/02/2019   10:44 AM  PHQ 2/9 Scores  PHQ - 2 Score 0 0 0 0 0 0 0  PHQ- 9 Score   1  0 0     Fall Risk    07/16/2022    2:55 PM 01/26/2022   10:48 AM 12/11/2020   11:23 AM 11/02/2019   10:43 AM 05/16/2018   10:13 AM  Fall Risk   Falls in the past year? 0 0 0 0 No  Number falls in past yr: 0 0 0 0   Injury with Fall? 0 0 0 0   Risk for fall due to :   Impaired vision    Follow up Falls evaluation completed  Falls prevention discussed Falls evaluation completed;Falls prevention discussed;Education provided     FALL RISK PREVENTION PERTAINING TO THE HOME:  Any stairs in or around the home? Yes  If so, are there any without handrails? Yes  Home free of loose throw rugs in walkways, pet beds, electrical cords, etc? Yes  Adequate lighting in your home to reduce risk of falls? Yes    ASSISTIVE DEVICES UTILIZED TO PREVENT FALLS:  Life alert? No  Use of a cane, walker or w/c? No  Grab bars in the bathroom? Yes  Shower chair or bench in shower? No  Elevated toilet seat or a handicapped toilet? No   Cognitive Function:        07/16/2022    2:56 PM 12/11/2020   11:25 AM 11/02/2019   10:44 AM  6CIT Screen  What Year?  0 points 0 points  What month?  0 points 0 points  What time? 0 points  0 points  Count back from 20 0 points 0 points 0 points  Months in reverse 0 points 0 points 0 points  Repeat phrase 0 points 0 points 0 points  Total Score   0 points    Immunizations Immunization History  Administered Date(s) Administered   Fluad Quad(high Dose 65+) 08/01/2019, 08/19/2020   Influenza Whole 08/13/2009   Influenza, High Dose Seasonal PF 09/06/2016, 08/05/2017, 08/09/2018   Influenza-Unspecified 07/11/2013, 08/06/2015   PFIZER(Purple Top)SARS-COV-2 Vaccination 10/31/2019, 11/18/2019   Pneumococcal Conjugate-13 12/12/2014   Pneumococcal Polysaccharide-23 02/04/2016   Td 08/11/2000    TDAP status: Up to date  Flu Vaccine status: Due, Education has been provided regarding the importance of this vaccine. Advised may receive this vaccine at local pharmacy or Health Dept. Aware to provide a copy of the vaccination record if obtained from local pharmacy or Health Dept. Verbalized acceptance and understanding.  Pneumococcal vaccine status: Up to date  Covid-19 vaccine status: Information provided on how to obtain vaccines.   Qualifies for Shingles Vaccine?  Yes   Zostavax completed No   Shingrix Completed?: No.    Education has been provided regarding the importance of this vaccine. Patient has been advised to call insurance company to determine out of pocket expense if they have not yet received this vaccine. Advised may also receive vaccine at local pharmacy or Health Dept. Verbalized acceptance and understanding.  Screening Tests Health Maintenance  Topic  Date Due   Zoster Vaccines- Shingrix (1 of 2) Never done   COVID-19 Vaccine (3 - Pfizer series) 01/13/2020   INFLUENZA VACCINE  01/09/2023 (Originally 05/11/2022)   TETANUS/TDAP  01/27/2023 (Originally 08/11/2010)   COLONOSCOPY (Pts 45-24yr Insurance coverage will need to be confirmed)  05/04/2023   Pneumonia Vaccine 75 Years old  Completed   DEXA SCAN  Completed   Hepatitis C Screening  Completed   HPV VACCINES  Aged Out    Health Maintenance  Health Maintenance Due  Topic Date Due   Zoster Vaccines- Shingrix (1 of 2) Never done   COVID-19 Vaccine (3 - Pfizer series) 01/13/2020    Colorectal cancer screening: Type of screening: Colonoscopy. Completed 05/03/16. Repeat every 7 years  Mammogram status: Completed 10/14/21. Repeat every year  Bone Density status: Completed 01/06/22. Results reflect: Bone density results: NORMAL. Repeat every 2 years.  Lung Cancer Screening: (Low Dose CT Chest recommended if Age 75-80years, 30 pack-year currently smoking OR have quit w/in 15years.) does not qualify.   Additional Screening:  Hepatitis C Screening: does qualify; Completed 11/29/17  Vision Screening: Recommended annual ophthalmology exams for early detection of glaucoma and other disorders of the eye. Is the patient up to date with their annual eye exam?  Yes  Who is the provider or what is the name of the office in which the patient attends annual eye exams? Dr. MEllie LunchIf pt is not established with a provider, would they like to be referred to a provider to establish care?  N/A .   Dental Screening: Recommended annual dental exams for proper oral hygiene  Community Resource Referral / Chronic Care Management: CRR required this visit?  No   CCM required this visit?  No      Plan:     I have personally reviewed and noted the following in the patient's chart:   Medical and social history Use of alcohol, tobacco or illicit drugs  Current medications and supplements including  opioid prescriptions. Patient is not currently taking opioid prescriptions. Functional ability and status Nutritional status Physical activity Advanced directives List of other physicians Hospitalizations, surgeries, and ER visits in previous 12 months Vitals Screenings to include cognitive, depression, and falls Referrals and appointments  In addition, I have reviewed and discussed with patient certain preventive protocols, quality metrics, and best practice recommendations. A written personalized care plan for preventive services as well as general preventive health recommendations were provided to patient.     JMichiel Cowboy RN   07/16/2022   Nurse Notes:  Ms. JReddix, Thank you for taking time to come for your Medicare Wellness Visit. I appreciate your ongoing commitment to your health goals. Please review the following plan we discussed and let me know if I can assist you in the future.   These are the goals we discussed:  Goals      Patient Stated     Lose weight and exercise     Patient Stated     I want to lose weight.     PharmD Care Plan  CARE PLAN ENTRY (see longitudinal plan of care for additional care plan information)  Current Barriers:  Chronic Disease Management support, education, and care coordination needs related to Hypertension, Hyperlipidemia, and Depression   Hypertension BP Readings from Last 3 Encounters:  08/28/19 126/79  01/11/19 127/83  10/10/18 124/72  Pharmacist Clinical Goal(s): Over the next 180 days, patient will work with PharmD and providers to maintain BP goal <130/80 Current regimen:  Amlodipine 2.5 mg once daily Interventions: Diet/exercise recommendations Patient self care activities - Over the next 180 days, patient will: Check BP once every 1-2 weeks, document, and provide at future appointments Ensure daily salt intake < 2300 mg/day  Hyperlipidemia Lab Results  Component Value Date/Time   LDLCALC 81 08/31/2019 10:55 AM    LDLDIRECT 104.0 05/16/2018 10:53 AM  Pharmacist Clinical Goal(s): Over the next 180 days, patient will work with PharmD and providers to achieve LDL goal < 70 Current regimen:  Simvastatin 20 mg once daily Interventions: Diet/exercise recommendations  Patient self care activities - Over the next 180 days, patient will: Continue current management Depression Pharmacist Clinical Goal(s) Over the next 180 days, patient will work with PharmD and providers to minimize depression connect Current regimen:  Escitalopram 20 mg once daily  Interventions: N/a Patient self care activities - Over the next 180 days, patient will: Continue current management  Medication management Pharmacist Clinical Goal(s): Over the next 180 days, patient will work with PharmD and providers to maintain optimal medication adherence Current pharmacy: [facility] Interventions Comprehensive medication review performed. Continue current medication management strategy. Patient self care activities - Over the next 180 days, patient will: Take medications as prescribed Report any questions or concerns to PharmD and/or provider(s) Initial goal documentation.        This is a list of the screening recommended for you and due dates:  Health Maintenance  Topic Date Due   Zoster (Shingles) Vaccine (1 of 2) Never done   COVID-19 Vaccine (3 - Pfizer series) 01/13/2020   Flu Shot  01/09/2023*   Tetanus Vaccine  01/27/2023*   Colon Cancer Screening  05/04/2023   Pneumonia Vaccine  Completed   DEXA scan (bone density measurement)  Completed   Hepatitis C Screening: USPSTF Recommendation to screen - Ages 58-79 yo.  Completed   HPV Vaccine  Aged Out  *Topic was postponed. The date shown is not the original due date.

## 2022-07-16 NOTE — Patient Instructions (Signed)

## 2022-08-03 ENCOUNTER — Ambulatory Visit: Payer: Medicare Other | Admitting: Family Medicine

## 2022-08-17 DIAGNOSIS — Z23 Encounter for immunization: Secondary | ICD-10-CM | POA: Diagnosis not present

## 2022-10-18 ENCOUNTER — Other Ambulatory Visit: Payer: Self-pay | Admitting: Family Medicine

## 2022-10-18 DIAGNOSIS — Z1231 Encounter for screening mammogram for malignant neoplasm of breast: Secondary | ICD-10-CM

## 2022-10-20 ENCOUNTER — Ambulatory Visit: Payer: Medicare Other

## 2022-12-07 ENCOUNTER — Ambulatory Visit
Admission: RE | Admit: 2022-12-07 | Discharge: 2022-12-07 | Disposition: A | Discharge status: Home / Self Care | Payer: Medicare Other | Source: Ambulatory Visit | Attending: Family Medicine | Admitting: Family Medicine

## 2022-12-07 DIAGNOSIS — Z1231 Encounter for screening mammogram for malignant neoplasm of breast: Secondary | ICD-10-CM | POA: Diagnosis not present

## 2023-01-25 DIAGNOSIS — H52203 Unspecified astigmatism, bilateral: Secondary | ICD-10-CM | POA: Diagnosis not present

## 2023-01-25 DIAGNOSIS — H2513 Age-related nuclear cataract, bilateral: Secondary | ICD-10-CM | POA: Diagnosis not present

## 2023-01-25 DIAGNOSIS — H353131 Nonexudative age-related macular degeneration, bilateral, early dry stage: Secondary | ICD-10-CM | POA: Diagnosis not present

## 2023-02-10 ENCOUNTER — Other Ambulatory Visit: Payer: Self-pay | Admitting: Family Medicine

## 2023-03-14 ENCOUNTER — Telehealth: Payer: Self-pay | Admitting: Family Medicine

## 2023-03-14 ENCOUNTER — Other Ambulatory Visit: Payer: Self-pay | Admitting: Family Medicine

## 2023-03-14 MED ORDER — ROSUVASTATIN CALCIUM 5 MG PO TABS: 5.0000 mg | ORAL_TABLET | Freq: Every day | ORAL | 3 refills | Status: DC

## 2023-03-14 NOTE — Telephone Encounter (Signed)
Prescription Request   03/14/2023  LOV: 01/26/2022  What is the name of the medication or equipment?  rosuvastatin (CRESTOR) 5 MG tablet  Have you contacted your pharmacy to request a refill? Yes   Which pharmacy would you like this sent to?  [id number] - [place], [place] - [address] AT COR[address] OF [facility] [address] [place] [address] Phone: [phone] Fax: [phone]    Patient notified that their request is being sent to the clinical staff for review and that they should receive a response within 2 business days.   Please advise at Mobile [phone] (mobile)

## 2023-03-14 NOTE — Telephone Encounter (Signed)
Refill sent to requested pharmacy.

## 2023-05-09 ENCOUNTER — Ambulatory Visit (INDEPENDENT_AMBULATORY_CARE_PROVIDER_SITE_OTHER): Payer: Medicare Other | Admitting: Family Medicine

## 2023-05-09 ENCOUNTER — Encounter: Payer: Self-pay | Admitting: Family Medicine

## 2023-05-09 VITALS — BP 118/74 | HR 91 | Temp 98.0°F | Ht 61.5 in | Wt 216.2 lb

## 2023-05-09 DIAGNOSIS — I10 Essential (primary) hypertension: Secondary | ICD-10-CM | POA: Diagnosis not present

## 2023-05-09 DIAGNOSIS — R739 Hyperglycemia, unspecified: Secondary | ICD-10-CM

## 2023-05-09 DIAGNOSIS — F325 Major depressive disorder, single episode, in full remission: Secondary | ICD-10-CM | POA: Diagnosis not present

## 2023-05-09 DIAGNOSIS — E785 Hyperlipidemia, unspecified: Secondary | ICD-10-CM

## 2023-05-09 DIAGNOSIS — Z131 Encounter for screening for diabetes mellitus: Secondary | ICD-10-CM | POA: Diagnosis not present

## 2023-05-09 LAB — COMPREHENSIVE METABOLIC PANEL
ALT: 11 U/L (ref 0–35)
AST: 15 U/L (ref 0–37)
Albumin: 4 g/dL (ref 3.5–5.2)
Alkaline Phosphatase: 70 U/L (ref 39–117)
BUN: 13 mg/dL (ref 6–23)
CO2: 29 mEq/L (ref 19–32)
Calcium: 9.5 mg/dL (ref 8.4–10.5)
Chloride: 102 mEq/L (ref 96–112)
Creatinine, Ser: 0.61 mg/dL (ref 0.40–1.20)
GFR: 86.88 mL/min (ref >60.00)
Glucose, Bld: 107 mg/dL — ABNORMAL HIGH (ref 70–99)
Potassium: 4 mEq/L (ref 3.5–5.1)
Sodium: 138 mEq/L (ref 135–145)
Total Bilirubin: 0.6 mg/dL (ref 0.2–1.2)
Total Protein: 7.3 g/dL (ref 6.0–8.3)

## 2023-05-09 LAB — URINALYSIS, ROUTINE W REFLEX MICROSCOPIC
Bilirubin Urine: NEGATIVE
Hgb urine dipstick: NEGATIVE
Ketones, ur: NEGATIVE
Leukocytes,Ua: NEGATIVE
Nitrite: NEGATIVE
RBC / HPF: NONE SEEN (ref 0–?)
Specific Gravity, Urine: 1.01 (ref 1.000–1.030)
Total Protein, Urine: NEGATIVE
Urine Glucose: NEGATIVE
Urobilinogen, UA: 0.2 (ref 0.0–1.0)
WBC, UA: NONE SEEN (ref 0–?)
pH: 7.5 (ref 5.0–8.0)

## 2023-05-09 LAB — CBC WITH DIFFERENTIAL/PLATELET
Basophils Absolute: 0.1 10*3/uL (ref 0.0–0.1)
Basophils Relative: 0.6 % (ref 0.0–3.0)
Eosinophils Absolute: 0 10*3/uL (ref 0.0–0.7)
Eosinophils Relative: 0.5 % (ref 0.0–5.0)
HCT: 39.9 % (ref 36.0–46.0)
Hemoglobin: 13.2 g/dL (ref 12.0–15.0)
Lymphocytes Relative: 29.7 % (ref 12.0–46.0)
Lymphs Abs: 2.5 10*3/uL (ref 0.7–4.0)
MCHC: 33.1 g/dL (ref 30.0–36.0)
MCV: 88.9 fl (ref 78.0–100.0)
Monocytes Absolute: 0.6 10*3/uL (ref 0.1–1.0)
Monocytes Relative: 7.3 % (ref 3.0–12.0)
Neutro Abs: 5.1 10*3/uL (ref 1.4–7.7)
Neutrophils Relative %: 61.9 % (ref 43.0–77.0)
Platelets: 277 10*3/uL (ref 150.0–400.0)
RBC: 4.49 Mil/uL (ref 3.87–5.11)
RDW: 12.8 % (ref 11.5–15.5)
WBC: 8.3 10*3/uL (ref 4.0–10.5)

## 2023-05-09 LAB — LIPID PANEL
Cholesterol: 152 mg/dL (ref 0–200)
HDL: 56.8 mg/dL (ref >39.00)
LDL Cholesterol: 79 mg/dL (ref 0–99)
NonHDL: 95.11
Total CHOL/HDL Ratio: 3
Triglycerides: 83 mg/dL (ref 0.0–149.0)
VLDL: 16.6 mg/dL (ref 0.0–40.0)

## 2023-05-09 LAB — HEMOGLOBIN A1C: Hgb A1c MFr Bld: 5.7 % (ref 4.6–6.5)

## 2023-05-09 MED ORDER — ALPRAZOLAM 0.25 MG PO TABS: ORAL_TABLET | ORAL | 0 refills | Status: DC

## 2023-05-09 MED ORDER — ESCITALOPRAM OXALATE 20 MG PO TABS: 20.0000 mg | ORAL_TABLET | Freq: Every day | ORAL | 3 refills | Status: DC

## 2023-05-09 MED ORDER — TRIAMCINOLONE ACETONIDE 0.1 % EX CREA: TOPICAL_CREAM | Freq: Two times a day (BID) | CUTANEOUS | 1 refills | Status: DC

## 2023-05-09 NOTE — Patient Instructions (Addendum)
[facility] contact Please call to schedule visit and/or procedure Address: [address] [place], [place], [place] [address] Phone: [phone]   Recommend Tetanus, Diphtheria, and Pertussis (Tdap) at pharmacy- let us know when you get flu shot in the fall  I love your idea of getting a trainer even once or twice a week for 12 weeks to get you started again  Consider intermittent fasting 8-10 hours a day or calorie counting with myfitnesspal  Please stop by lab before you go If you have mychart- we will send your results within 3 business days of [place] receiving them.  If you do not have mychart- we will call you about results within 5 business days of [place] receiving them.  *please also note that you will see labs on mychart as soon as they post. I will later go in and write notes on them- will say "notes from Dr. Durene Cal"   Recommended follow up: Return in about 6 months (around 11/09/2023) for followup or sooner if needed.Schedule b4 you leave.

## 2023-05-09 NOTE — Progress Notes (Signed)
Phone [phone] In person visit   Subjective:   Cynthia Berry is a 77 y.o. year old very pleasant female patient who presents for/with See problem oriented charting Chief Complaint  Patient presents with   Medical Management of Chronic Issues   Hyperlipidemia   Hypertension    Past Medical History-  Patient Active Problem List   Diagnosis Date Noted   Osteopenia 03/27/2019    Priority: Medium    Hx of adenomatous polyp of colon 04/30/2016    Priority: Medium    Gout 12/28/2014    Priority: Medium    Hyperlipidemia 09/30/2009    Priority: Medium    Depression 05/22/2007    Priority: Medium    Essential hypertension 05/22/2007    Priority: Medium    Allergic rhinitis 12/28/2014    Priority: Low   Hyperglycemia 08/31/2013    Priority: Low   Morbid obesity (HCC) 04/19/2017    Medications- reviewed and updated Current Outpatient Medications  Medication Sig Dispense Refill   amLODipine (NORVASC) 2.5 MG tablet TAKE 1 TABLET BY MOUTH EVERY DAY 90 tablet 3   aspirin 81 MG tablet Take 81 mg by mouth daily.     rosuvastatin (CRESTOR) 5 MG tablet TAKE 1 TABLET (5 MG TOTAL) BY MOUTH DAILY. 90 tablet 3   ALPRAZolam (XANAX) 0.25 MG tablet Do not drive for 8 hours after use. take 1 tablet by mouth twice a day if needed 40 tablet 0   escitalopram (LEXAPRO) 20 MG tablet Take 1 tablet (20 mg total) by mouth daily. 90 tablet 3   triamcinolone cream (KENALOG) 0.1 % Apply topically 2 (two) times daily. For 7 days maximum 30 g 1   No current facility-administered medications for this visit.     Objective:  BP 118/74   Pulse 91   Temp 98 F (36.7 C)   Ht 5' 1.5" (1.562 m)   Wt 216 lb 3.2 oz (98.1 kg)   LMP  (LMP Unknown)   SpO2 97%   BMI 40.19 kg/m  Gen: NAD, resting comfortably CV: RRR no murmurs rubs or gallops Lungs: CTAB no crackles, wheeze, rhonchi Abdomen: soft/nontender/nondistended/normal bowel sounds. No rebound or guarding.  Ext: trace edema Skin: warm, dry      Assessment and Plan   # Social update-patient is a member at [employer]- has not been making it lately- have encouraged her. From avs "I love your idea of getting a trainer even once or twice a week for 12 weeks to get you started again"  #hypertension/history of gout on diuretic-no issues off S: medication: Amlodipine 2.5 mg daily BP Readings from Last 3 Encounters:  05/09/23 118/74  01/26/22 134/70  10/21/21 130/88  A/P: stable- continue current medicines  -she prefers to check urine with labs  #hyperlipidemia S: Medication:Rosuvastatin 5 mg daily -Prior simvastatin but she preferred not to take this along with amlodipine Lab Results  Component Value Date   CHOL 152 10/21/2021   HDL 61.80 10/21/2021   LDLCALC 74 10/21/2021   LDLDIRECT 104.0 05/16/2018   TRIG 80.0 10/21/2021   CHOLHDL 2 10/21/2021   A/P: hopefully stable or improved- update lipid panel today. Continue current meds for now     # Depression S: Medication:Escitalopram 20 mg - Situational alprazolam- had some losses this summer in her circle    05/09/2023   10:41 AM 07/16/2022    3:05 PM 01/26/2022   10:49 AM  Depression screen PHQ 2/9  Decreased Interest 0 0 0  Down, Depressed, Hopeless  0 0 0  PHQ - 2 Score 0 0 0  Altered sleeping 0    Tired, decreased energy 0    Change in appetite 0    Feeling bad or failure about yourself  0    Trouble concentrating 0    Moving slowly or fidgety/restless 0    Suicidal thoughts 0    PHQ-9 Score 0    Difficult doing work/chores Not difficult at all    A/P: full remission of depressoin- continue current medications- about once a month if needed on alprazolam- discussed fall risks so glad using intermittenty    # Hyperglycemia/insulin resistance/prediabetes-A1c at least 5.7 in the past S:  Medication: None Lab Results  Component Value Date   HGBA1C 5.7 10/21/2021   HGBA1C 5.6 08/31/2019   HGBA1C 5.8 05/16/2018  A/P: hopefully stable- update a1c today. Continue  without meds for now - enocuraged watching sugar intake  Morbid obesity status noted with BMI over 40-he gained 3 pounds from last visit in 2023- Encouraged need for healthy eating, regular exercise, weight loss.   Wt Readings from Last 3 Encounters:  05/09/23 216 lb 3.2 oz (98.1 kg)  01/26/22 213 lb 9.6 oz (96.9 kg)  10/21/21 213 lb 8 oz (96.8 kg)    # Osteopenia S: Noted 2020 mild-she is wanting to repeat in 3 to 5 years and work on weightbearing exercise and encouraged at least 2000 units vitamin D  A/P: she prefers to wait until next year for this    # Health maintenance-encourage patient to complete colonoscopy Dr. Leone Payor from 2017 with 7 year repeat-she declines COVID and Shingrix shot at this time.  Also recommended Tetanus, Diphtheria, and Pertussis (Tdap).  Will do flu shot in fall  #vaginal itching without discharge- bothersome in past- wants to check urine- triamcinolone in 2023 was helpful - advised very sparing use- refill today  Recommended follow up: Return in about 6 months (around 11/09/2023) for followup or sooner if needed.Schedule b4 you leave.  Lab/Order associations:   ICD-10-CM   1. Essential hypertension  I10 CBC with Differential/Platelet    Comprehensive metabolic panel    Lipid panel    Urinalysis, Routine w reflex microscopic    2. Hyperlipidemia, unspecified hyperlipidemia type  E78.5 CBC with Differential/Platelet    Comprehensive metabolic panel    Lipid panel    Urinalysis, Routine w reflex microscopic    3. Hyperglycemia  R73.9 Hemoglobin A1c    4. Major depressive disorder with single episode, in full remission (HCC)  F32.5     5. Morbid obesity (HCC) Chronic E66.01     6. Screening for diabetes mellitus  Z13.1 Hemoglobin A1c      Meds ordered this encounter  Medications   ALPRAZolam (XANAX) 0.25 MG tablet    Sig: Do not drive for 8 hours after use. take 1 tablet by mouth twice a day if needed    Dispense:  40 tablet    Refill:  0    triamcinolone cream (KENALOG) 0.1 %    Sig: Apply topically 2 (two) times daily. For 7 days maximum    Dispense:  30 g    Refill:  1   escitalopram (LEXAPRO) 20 MG tablet    Sig: Take 1 tablet (20 mg total) by mouth daily.    Dispense:  90 tablet    Refill:  3    Return precautions advised.  Tana Conch, MD

## 2023-05-30 DIAGNOSIS — L821 Other seborrheic keratosis: Secondary | ICD-10-CM | POA: Diagnosis not present

## 2023-05-30 DIAGNOSIS — L309 Dermatitis, unspecified: Secondary | ICD-10-CM | POA: Diagnosis not present

## 2023-05-30 DIAGNOSIS — D2361 Other benign neoplasm of skin of right upper limb, including shoulder: Secondary | ICD-10-CM | POA: Diagnosis not present

## 2023-06-17 ENCOUNTER — Encounter: Payer: Self-pay | Admitting: Physician Assistant

## 2023-06-17 ENCOUNTER — Telehealth (INDEPENDENT_AMBULATORY_CARE_PROVIDER_SITE_OTHER): Payer: Medicare Other | Admitting: Physician Assistant

## 2023-06-17 DIAGNOSIS — R051 Acute cough: Secondary | ICD-10-CM | POA: Diagnosis not present

## 2023-06-17 DIAGNOSIS — U071 COVID-19: Secondary | ICD-10-CM | POA: Diagnosis not present

## 2023-06-17 MED ORDER — NIRMATRELVIR/RITONAVIR (PAXLOVID)TABLET: 3.0000 | ORAL_TABLET | Freq: Two times a day (BID) | ORAL | 0 refills | Status: AC

## 2023-06-17 MED ORDER — BENZONATATE 100 MG PO CAPS: 100.0000 mg | ORAL_CAPSULE | Freq: Two times a day (BID) | ORAL | 0 refills | Status: DC | PRN

## 2023-06-17 NOTE — Progress Notes (Signed)
MyChart Video Visit    Virtual Visit via Video Note   This format is felt to be most appropriate for this patient at this time. Physical exam was limited by quality of the video and audio technology used for the visit.   Patient location: home Provider location: [email or web address]  I discussed the limitations of evaluation and management by telemedicine and the availability of in person appointments. The patient expressed understanding and agreed to proceed.  Patient: Cynthia Berry   DOB: Jan 03, 1946   77 y.o. Female  MRN: [id number] Visit Date: 06/17/2023  Today's healthcare provider: Alfredia Ferguson, PA-C   Cc. covid  Subjective    HPI  Pt reports starting Wednesday, x 2  days ago, nasal congestion, headache, sore throat, cough, rib pain from coughing, fatigue, fever of tmax 101. Taking tylenol, mucinex, delsym and claritin otc. Tested positive for COVID at home today. Denies SOB, wheezing.   Medications: Outpatient Medications Prior to Visit  Medication Sig   ALPRAZolam (XANAX) 0.25 MG tablet Do not drive for 8 hours after use. take 1 tablet by mouth twice a day if needed   amLODipine (NORVASC) 2.5 MG tablet TAKE 1 TABLET BY MOUTH EVERY DAY   aspirin 81 MG tablet Take 81 mg by mouth daily.   escitalopram (LEXAPRO) 20 MG tablet Take 1 tablet (20 mg total) by mouth daily.   rosuvastatin (CRESTOR) 5 MG tablet TAKE 1 TABLET (5 MG TOTAL) BY MOUTH DAILY.   triamcinolone cream (KENALOG) 0.1 % Apply topically 2 (two) times daily. For 7 days maximum   No facility-administered medications prior to visit.    Review of Systems  Constitutional:  Positive for fatigue and fever.  HENT:  Positive for congestion, rhinorrhea, sinus pressure and sore throat.   Respiratory:  Positive for cough. Negative for shortness of breath.   Cardiovascular:  Negative for chest pain and leg swelling.  Gastrointestinal:  Negative for abdominal pain.  Neurological:  Positive for headaches. Negative for dizziness.        Objective    LMP  (LMP Unknown)     Physical Exam Constitutional:      Appearance: Normal appearance. She is not ill-appearing.  Neurological:     Mental Status: She is oriented to person, place, and time.  Psychiatric:        Mood and Affect: Mood normal.        Behavior: Behavior normal.        Assessment & Plan     1. COVID-19 Hydrate, rest, cont tylenol for fevers, mucinex, delsym, claritin. Rx tessalon for cough as well.  Rx paxlovid -- given age, comorbidities - nirmatrelvir/ritonavir (PAXLOVID) 20 x 150 MG & 10 x 100MG  TABS; Take 3 tablets by mouth 2 (two) times daily for 5 days. (Take nirmatrelvir 150 mg two tablets twice daily for 5 days and ritonavir 100 mg one tablet twice daily for 5 days)  Dispense: 30 tablet; Refill: 0 - benzonatate (TESSALON) 100 MG capsule; Take 1 capsule (100 mg total) by mouth 2 (two) times daily as needed for cough.  Dispense: 20 capsule; Refill: 0  2. Acute cough - benzonatate (TESSALON) 100 MG capsule; Take 1 capsule (100 mg total) by mouth 2 (two) times daily as needed for cough.  Dispense: 20 capsule; Refill: 0   Return if symptoms worsen or fail to improve.     I discussed the assessment and treatment plan with the patient. The patient was provided an opportunity to ask questions and  all were answered. The patient agreed with the plan and demonstrated an understanding of the instructions.   The patient was advised to call back or seek an in-person evaluation if the symptoms worsen or if the condition fails to improve as anticipated.  I provided 10 minutes of non-face-to-face time during this encounter.  I, Alfredia Ferguson, PA-C have reviewed all documentation for this visit. The documentation on  06/17/23   for the exam, diagnosis, procedures, and orders are all accurate and complete.   Alfredia Ferguson, PA-C [facility] [phone] (phone) [phone] (fax)  [facility]

## 2023-06-30 DIAGNOSIS — H10502 Unspecified blepharoconjunctivitis, left eye: Secondary | ICD-10-CM | POA: Diagnosis not present

## 2023-08-02 ENCOUNTER — Ambulatory Visit (INDEPENDENT_AMBULATORY_CARE_PROVIDER_SITE_OTHER): Payer: Medicare Other

## 2023-08-02 VITALS — Wt 216.0 lb

## 2023-08-02 DIAGNOSIS — Z1211 Encounter for screening for malignant neoplasm of colon: Secondary | ICD-10-CM

## 2023-08-02 DIAGNOSIS — Z0000 Encounter for general adult medical examination without abnormal findings: Secondary | ICD-10-CM

## 2023-08-02 NOTE — Progress Notes (Addendum)
Subjective:   Cynthia Berry is a 77 y.o. female who presents for Medicare Annual (Subsequent) preventive examination.  Visit Complete: Virtual I connected with  Rebecka Apley on 08/02/23 by a audio enabled telemedicine application and verified that I am speaking with the correct person using two identifiers.  Patient Location: Home  Provider Location: Office/Clinic  I discussed the limitations of evaluation and management by telemedicine. The patient expressed understanding and agreed to proceed.  Vital Signs: Because this visit was a virtual/telehealth visit, some criteria may be missing or patient reported. Any vitals not documented were not able to be obtained and vitals that have been documented are patient reported.  Cardiac Risk Factors include: advanced age (>58men, >46 women);dyslipidemia;hypertension;obesity (BMI >30kg/m2)     Objective:    Today's Vitals   08/02/23 1430  Weight: 216 lb (98 kg)   Body mass index is 40.15 kg/m.     08/02/2023    2:34 PM 07/16/2022    2:55 PM 12/11/2020   11:22 AM 11/02/2019   10:43 AM 02/19/2016    1:43 PM  Advanced Directives  Does Patient Have a Medical Advance Directive? Yes Yes Yes Yes Yes  Type of Estate agent of [place];Living will Healthcare Power of [place];Living will Living will Living will;Healthcare Power of Attorney   Does patient want to make changes to medical advance directive?  No - Patient declined  No - Patient declined   Copy of Healthcare Power of Attorney in Chart? No - copy requested No - copy requested  No - copy requested No - copy requested    Current Medications (verified) Outpatient Encounter Medications as of 08/02/2023  Medication Sig   ALPRAZolam (XANAX) 0.25 MG tablet Do not drive for 8 hours after use. take 1 tablet by mouth twice a day if needed   amLODipine (NORVASC) 2.5 MG tablet TAKE 1 TABLET BY MOUTH EVERY DAY   aspirin 81 MG tablet Take 81 mg by mouth daily.   benzonatate  (TESSALON) 100 MG capsule Take 1 capsule (100 mg total) by mouth 2 (two) times daily as needed for cough.   escitalopram (LEXAPRO) 20 MG tablet Take 1 tablet (20 mg total) by mouth daily.   rosuvastatin (CRESTOR) 5 MG tablet TAKE 1 TABLET (5 MG TOTAL) BY MOUTH DAILY.   triamcinolone cream (KENALOG) 0.1 % Apply topically 2 (two) times daily. For 7 days maximum   No facility-administered encounter medications on file as of 08/02/2023.    Allergies (verified) Doxycycline, Penicillins, and Sulfa antibiotics   History: Past Medical History:  Diagnosis Date   Anemia    Anxiety    Pt very anxious/sensitive to certain meds!   COVID 2022   Depression    Hx of adenomatous polyp of colon 04/30/2016   Hyperlipidemia    Hypertension    Post-operative nausea and vomiting    due to nerves   Past Surgical History:  Procedure Laterality Date   TONSILLECTOMY     as child   Family History  Problem Relation Age of Onset   Stroke Mother 25   Heart disease Father 53       mi, smoker   Lung cancer Sister        1/2 sister-lung cancer   Heart disease Sister    Colon cancer Neg Hx    Esophageal cancer Neg Hx    Rectal cancer Neg Hx    Stomach cancer Neg Hx    Breast cancer Neg Hx  Social History   Socioeconomic History   Marital status: Widowed    Spouse name: Not on file   Number of children: 2   Years of education: college   Highest education level: Bachelor's degree (e.g., BA, AB, BS)  Occupational History   Not on file  Tobacco Use   Smoking status: Never   Smokeless tobacco: Never  Vaping Use   Vaping status: Never Used  Substance and Sexual Activity   Alcohol use: Yes    Comment: about 1 drink per month   Drug use: No   Sexual activity: Never  Other Topics Concern   Not on file  Social History Narrative   Widowed 2008. 2 daughters-1 in [place] and 1 in [place]. 2 grandkids      Taught special education until daughters born   Jhonnie Garner tto [employer]      Hobbies: dancing,  cooking and family gatherings.    Social Determinants of Health   Financial Resource Strain: Low Risk  (08/02/2023)   Overall Financial Resource Strain (CARDIA)    Difficulty of Paying Living Expenses: Not hard at all  Food Insecurity: No Food Insecurity (08/02/2023)   Hunger Vital Sign    Worried About Running Out of Food in the Last Year: Never true    Ran Out of Food in the Last Year: Never true  Transportation Needs: No Transportation Needs (08/02/2023)   PRAPARE - Administrator, Civil Service (Medical): No    Lack of Transportation (Non-Medical): No  Physical Activity: Insufficiently Active (08/02/2023)   Exercise Vital Sign    Days of Exercise per Week: 3 days    Minutes of Exercise per Session: 30 min  Stress: No Stress Concern Present (08/02/2023)   [employer] of Occupational Health - Occupational Stress Questionnaire    Feeling of Stress : Not at all  Social Connections: Moderately Integrated (08/02/2023)   Social Connection and Isolation Panel [NHANES]    Frequency of Communication with Friends and Family: More than three times a week    Frequency of Social Gatherings with Friends and Family: More than three times a week    Attends Religious Services: More than 4 times per year    Active Member of [employer] or Organizations: Yes    Attends Banker Meetings: 1 to 4 times per year    Marital Status: Widowed    Tobacco Counseling Counseling given: Not Answered   Clinical Intake:  Pre-visit preparation completed: Yes  Pain : No/denies pain     BMI - recorded: 40.15 Nutritional Status: BMI > 30  Obese Nutritional Risks: None Diabetes: No  How often do you need to have someone help you when you read instructions, pamphlets, or other written materials from your doctor or pharmacy?: 1 - Never  Interpreter Needed?: No  Information entered by :: Lanier Ensign, LPN   Activities of Daily Living    08/02/2023    2:31 PM  In your  present state of health, do you have any difficulty performing the following activities:  Hearing? 0  Vision? 0  Difficulty concentrating or making decisions? 0  Walking or climbing stairs? 0  Dressing or bathing? 0  Doing errands, shopping? 0  Preparing Food and eating ? N  Using the Toilet? N  In the past six months, have you accidently leaked urine? N  Do you have problems with loss of bowel control? N  Managing your Medications? N  Managing your Finances? N  Housekeeping or managing  your Housekeeping? N    Patient Care Team: Shelva Majestic, MD as PCP - General (Family Medicine) Dahlia Byes, [facility] (Inactive) as Pharmacist (Pharmacist)  Indicate any recent Medical Services you may have received from other than [facility] providers in the past year (date may be approximate).     Assessment:   This is a routine wellness examination for Mirtha.  Hearing/Vision screen Hearing Screening - Comments:: Pt denies any hearing issues  Vision Screening - Comments:: Pt follows up with Dr Charlotte Sanes for annual eye exams    Goals Addressed             This Visit's Progress    Patient Stated       Lose weight and increase exercise        Depression Screen    08/02/2023    2:33 PM 05/09/2023   10:41 AM 07/16/2022    3:05 PM 01/26/2022   10:49 AM 08/07/2021   11:28 AM 12/11/2020   11:20 AM 10/24/2020   12:49 PM  PHQ 2/9 Scores  PHQ - 2 Score 0 0 0 0 0 0 0  PHQ- 9 Score  0   1  0    Fall Risk    08/02/2023    3:06 PM 05/09/2023   10:40 AM 07/16/2022    2:55 PM 01/26/2022   10:48 AM 12/11/2020   11:23 AM  Fall Risk   Falls in the past year? 0 0 0 0 0  Number falls in past yr: 0 0 0 0 0  Injury with Fall? 0 0 0 0 0  Risk for fall due to : No Fall Risks No Fall Risks   Impaired vision  Follow up Falls prevention discussed Falls evaluation completed Falls evaluation completed  Falls prevention discussed    MEDICARE RISK AT HOME: Medicare Risk at Home Any stairs in or around the  home?: Yes If so, are there any without handrails?: No Home free of loose throw rugs in walkways, pet beds, electrical cords, etc?: Yes Adequate lighting in your home to reduce risk of falls?: Yes Life alert?: No Use of a cane, walker or w/c?: No Grab bars in the bathroom?: Yes Shower chair or bench in shower?: No Elevated toilet seat or a handicapped toilet?: No  TIMED UP AND GO:  Was the test performed?  No    Cognitive Function:    08/02/2023    2:34 PM  MMSE - Mini Mental State Exam  Not completed: Refused        07/16/2022    2:56 PM 12/11/2020   11:25 AM 11/02/2019   10:44 AM  6CIT Screen  What Year? 0 points 0 points 0 points  What month? 0 points 0 points 0 points  What time? 0 points  0 points  Count back from 20 0 points 0 points 0 points  Months in reverse 0 points 0 points 0 points  Repeat phrase 0 points 0 points 0 points  Total Score 0 points  0 points    Immunizations Immunization History  Administered Date(s) Administered   Fluad Quad(high Dose 65+) 08/01/2019, 08/19/2020, 08/17/2022   Influenza Whole 08/13/2009   Influenza, High Dose Seasonal PF 09/06/2016, 08/05/2017, 08/09/2018   Influenza-Unspecified 07/11/2013, 08/06/2015   PFIZER(Purple Top)SARS-COV-2 Vaccination 10/31/2019, 11/18/2019   Pneumococcal Conjugate-13 12/12/2014   Pneumococcal Polysaccharide-23 02/04/2016   Td 08/11/2000      Flu Vaccine status: Due, Education has been provided regarding the importance of this vaccine. Advised may receive  this vaccine at local pharmacy or Health Dept. Aware to provide a copy of the vaccination record if obtained from local pharmacy or Health Dept. Verbalized acceptance and understanding.  Pneumococcal vaccine status: Up to date  Covid-19 vaccine status: Information provided on how to obtain vaccines.   Qualifies for Shingles Vaccine? Yes   Zostavax completed No   Shingrix Completed?: No.    Education has been provided regarding the importance  of this vaccine. Patient has been advised to call insurance company to determine out of pocket expense if they have not yet received this vaccine. Advised may also receive vaccine at local pharmacy or Health Dept. Verbalized acceptance and understanding.  Screening Tests Health Maintenance  Topic Date Due   Colonoscopy  05/04/2023   COVID-19 Vaccine (3 - 2023-24 season) 06/12/2023   Zoster Vaccines- Shingrix (1 of 2) 08/09/2023 (Originally 12/25/1996)   INFLUENZA VACCINE  01/09/2024 (Originally 05/12/2023)   Medicare Annual Wellness (AWV)  08/01/2024   Pneumonia Vaccine 16+ Years old  Completed   DEXA SCAN  Completed   Hepatitis C Screening  Completed   HPV VACCINES  Aged Out   DTaP/Tdap/Td  Discontinued    Health Maintenance  Health Maintenance Due  Topic Date Due   Colonoscopy  05/04/2023   COVID-19 Vaccine (3 - 2023-24 season) 06/12/2023    Colorectal cancer screening: Referral to GI placed 08/02/23. Pt aware the office will call re: appt.  Mammogram status: Completed 12/07/22. Repeat every year  Bone Density status: Completed 01/06/22. Results reflect: Bone density results: OSTEOPENIA. Repeat every 2 years.   Additional Screening:  Hepatitis C Screening: Completed 11/29/17  Vision Screening: Recommended annual ophthalmology exams for early detection of glaucoma and other disorders of the eye. Is the patient up to date with their annual eye exam?  Yes  Who is the provider or what is the name of the office in which the patient attends annual eye exams? [facility]  If pt is not established with a provider, would they like to be referred to a provider to establish care? No .   Dental Screening: Recommended annual dental exams for proper oral hygiene   Community Resource Referral / Chronic Care Management: CRR required this visit?  No   CCM required this visit?  No     Plan:     I have personally reviewed and noted the following in the patient's chart:   Medical and  social history Use of alcohol, tobacco or illicit drugs  Current medications and supplements including opioid prescriptions. Patient is not currently taking opioid prescriptions. Functional ability and status Nutritional status Physical activity Advanced directives List of other physicians Hospitalizations, surgeries, and ER visits in previous 12 months Vitals Screenings to include cognitive, depression, and falls Referrals and appointments  In addition, I have reviewed and discussed with patient certain preventive protocols, quality metrics, and best practice recommendations. A written personalized care plan for preventive services as well as general preventive health recommendations were provided to patient.     Marzella Schlein, LPN   16/07/[id number]   After Visit Summary: (MyChart) Due to this being a telephonic visit, the after visit summary with patients personalized plan was offered to patient via MyChart   Nurse Notes: none

## 2023-08-02 NOTE — Patient Instructions (Signed)
Ms. Cynthia Berry , Thank you for taking time to come for your Medicare Wellness Visit. I appreciate your ongoing commitment to your health goals. Please review the following plan we discussed and let me know if I can assist you in the future.   Referrals/Orders/Follow-Ups/Clinician Recommendations: continue working on losing weight and exercise   Each day, aim for 6 glasses of water, plenty of protein in your diet and try to get up and walk/ stretch every hour for 5-10 minutes at a time.    This is a list of the screening recommended for you and due dates:  Health Maintenance  Topic Date Due   Colon Cancer Screening  05/04/2023   COVID-19 Vaccine (3 - 2023-24 season) 06/12/2023   Zoster (Shingles) Vaccine (1 of 2) 08/09/2023*   Flu Shot  01/09/2024*   Medicare Annual Wellness Visit  08/01/2024   Pneumonia Vaccine  Completed   DEXA scan (bone density measurement)  Completed   Hepatitis C Screening  Completed   HPV Vaccine  Aged Out   DTaP/Tdap/Td vaccine  Discontinued  *Topic was postponed. The date shown is not the original due date.    Advanced directives: (Copy Requested) Please bring a copy of your health care power of attorney and living will to the office to be added to your chart at your convenience.  Next Medicare Annual Wellness Visit scheduled for next year: Yes

## 2023-08-18 DIAGNOSIS — M25572 Pain in left ankle and joints of left foot: Secondary | ICD-10-CM | POA: Diagnosis not present

## 2023-08-30 DIAGNOSIS — Z23 Encounter for immunization: Secondary | ICD-10-CM | POA: Diagnosis not present

## 2023-09-02 ENCOUNTER — Telehealth: Payer: Self-pay | Admitting: Internal Medicine

## 2023-09-02 DIAGNOSIS — M25572 Pain in left ankle and joints of left foot: Secondary | ICD-10-CM | POA: Diagnosis not present

## 2023-09-02 NOTE — Telephone Encounter (Signed)
Hi Dr. Leone Payor,    Patient is requesting schedule 04/2023 recall colonoscopy. Patient is now the age of 84. Would like for the patient to be scheduling directly for colonoscopy? Please review and advise on scheduling.    Thank you.

## 2023-09-05 NOTE — Telephone Encounter (Signed)
I think she is ok for direct colonoscopy  Hx polyp

## 2023-09-07 NOTE — Telephone Encounter (Signed)
Called patient to schedule colonoscopy. Left message to give a call back.

## 2023-09-30 ENCOUNTER — Ambulatory Visit (INDEPENDENT_AMBULATORY_CARE_PROVIDER_SITE_OTHER): Payer: Medicare Other | Admitting: Family

## 2023-09-30 ENCOUNTER — Encounter: Payer: Self-pay | Admitting: Family

## 2023-09-30 VITALS — BP 136/85 | HR 102 | Temp 98.0°F | Ht 61.5 in | Wt 212.0 lb

## 2023-09-30 DIAGNOSIS — J069 Acute upper respiratory infection, unspecified: Secondary | ICD-10-CM | POA: Diagnosis not present

## 2023-09-30 DIAGNOSIS — R051 Acute cough: Secondary | ICD-10-CM

## 2023-09-30 DIAGNOSIS — U071 COVID-19: Secondary | ICD-10-CM

## 2023-09-30 LAB — POC COVID19 BINAXNOW: SARS Coronavirus 2 Ag: NEGATIVE

## 2023-09-30 LAB — POCT INFLUENZA A/B
Influenza A, POC: NEGATIVE
Influenza B, POC: NEGATIVE

## 2023-09-30 MED ORDER — METHYLPREDNISOLONE ACETATE 80 MG/ML IJ SUSP
80.0000 mg | Freq: Once | INTRAMUSCULAR | Status: AC
  Administered 2023-09-30: 80 mg via INTRAMUSCULAR

## 2023-09-30 MED ORDER — AZITHROMYCIN 250 MG PO TABS: ORAL_TABLET | ORAL | 0 refills | Status: AC

## 2023-09-30 MED ORDER — BENZONATATE 100 MG PO CAPS: 100.0000 mg | ORAL_CAPSULE | Freq: Two times a day (BID) | ORAL | 0 refills | Status: DC | PRN

## 2023-09-30 NOTE — Patient Instructions (Signed)
It was very nice to see you today!   I have sent over a Zpack to help your symptoms. Restart Flonase (Fluticasone) nasal spray, 1 squirt each nostril twice a day for 3 days, then use just daily until feeling better. Drink up to 2 liters of water! This will help you feel better also.  Call the office back if symptoms persist.      PLEASE NOTE:  If you had any lab tests please let us know if you have not heard back within a few days. You may see your results on MyChart before we have a chance to review them but we will give you a call once they are reviewed by [place]. If we ordered any referrals today, please let us know if you have not heard from their office within the next week.

## 2023-09-30 NOTE — Progress Notes (Signed)
Patient ID: Cynthia Berry, female    DOB: 1946-05-21, 77 y.o.   MRN: [id number]  Chief Complaint  Patient presents with   Cough    Pt c/o Cough with mucus, facial pressure, left ear pain and nasal/chest congestion,Present a week. Has tried Claritin and mucinex which did not help        Discussed the use of AI scribe software for clinical note transcription with the patient, who gave verbal consent to proceed.  History of Present Illness   The patient, with a history of allergies, presents with a week-long history of upper respiratory symptoms. Initially, she experienced a postnasal drip which has since developed into a worsening cough and congestion. She also reports facial pressure and a deep, painful cough that has changed in character over the past few days. She has tried Claritin and [employer], which have reduced the intensity and frequency of the cough but have not resolved it. She has also used Flonase inconsistently for the past two to three days. She has no history of asthma or bronchitis and has received both pneumonia vaccines.    Assessment & Plan:     Upper Respiratory Infection - Symptoms of postnasal drip, facial pressure, and worsening cough for approximately one week. Negative for flu and COVID. No signs of lower respiratory involvement on examination. -Administer steroid shot Depo Medrol 60mg  today to expedite recovery. -Refill Tessalon pearles to help decrease intensity/frequency of cough. -Prescribe Azithromycin (Z-Pak) to address potential bacterial infection, allergies to PCN & Sulfa. -Advised to continue Flonase (fluticasone) nasal spray, 1 squirt each side twice daily for 3 days, then once daily. -Encouraged hydration and rest. Reevaluate if symptoms persist or worsen.  Pneumococcal Vaccination Status - Patient unsure of vaccination status. Records indicate both Prevnar 13 and Pneumovax 23 received in 2016 and 2017 respectively. -No further action required at this  time.     Subjective:    Outpatient Medications Prior to Visit  Medication Sig Dispense Refill   ALPRAZolam (XANAX) 0.25 MG tablet Do not drive for 8 hours after use. take 1 tablet by mouth twice a day if needed 40 tablet 0   amLODipine (NORVASC) 2.5 MG tablet TAKE 1 TABLET BY MOUTH EVERY DAY 90 tablet 3   aspirin 81 MG tablet Take 81 mg by mouth daily.     escitalopram (LEXAPRO) 20 MG tablet Take 1 tablet (20 mg total) by mouth daily. 90 tablet 3   rosuvastatin (CRESTOR) 5 MG tablet TAKE 1 TABLET (5 MG TOTAL) BY MOUTH DAILY. 90 tablet 3   triamcinolone cream (KENALOG) 0.1 % Apply topically 2 (two) times daily. For 7 days maximum 30 g 1   benzonatate (TESSALON) 100 MG capsule Take 1 capsule (100 mg total) by mouth 2 (two) times daily as needed for cough. (Patient not taking: Reported on 09/30/2023) 20 capsule 0   No facility-administered medications prior to visit.   Past Medical History:  Diagnosis Date   Anemia    Anxiety    Pt very anxious/sensitive to certain meds!   COVID 2022   Depression    Hx of adenomatous polyp of colon 04/30/2016   Hyperlipidemia    Hypertension    Post-operative nausea and vomiting    due to nerves   Past Surgical History:  Procedure Laterality Date   TONSILLECTOMY     as child   Allergies  Allergen Reactions   Doxycycline    Penicillins     REACTION: swelling,rash   Sulfa Antibiotics  Per pt, no reaction to Sulfa,but you avoid these meds.      Objective:    Physical Exam Vitals and nursing note reviewed.  Constitutional:      Appearance: Normal appearance. She is ill-appearing.     Interventions: Face mask in place.  HENT:     Right Ear: Tympanic membrane and ear canal normal.     Left Ear: Tympanic membrane and ear canal normal.     Nose:     Right Sinus: Frontal sinus tenderness present.     Left Sinus: Frontal sinus tenderness present.     Mouth/Throat:     Mouth: Mucous membranes are moist.     Pharynx: Posterior  oropharyngeal erythema present. No pharyngeal swelling, oropharyngeal exudate or uvula swelling.     Tonsils: No tonsillar exudate or tonsillar abscesses.  Cardiovascular:     Rate and Rhythm: Normal rate and regular rhythm.  Pulmonary:     Effort: Pulmonary effort is normal.     Breath sounds: Normal breath sounds.  Musculoskeletal:        General: Normal range of motion.  Lymphadenopathy:     Head:     Right side of head: No preauricular or posterior auricular adenopathy.     Left side of head: No preauricular or posterior auricular adenopathy.     Cervical: No cervical adenopathy.  Skin:    General: Skin is warm and dry.  Neurological:     Mental Status: She is alert.  Psychiatric:        Mood and Affect: Mood normal.        Behavior: Behavior normal.    BP 136/85 (BP Location: Left Arm, Patient Position: Sitting, Cuff Size: Large)   Pulse (!) 102   Temp 98 F (36.7 C) (Temporal)   Ht 5' 1.5" (1.562 m)   Wt 212 lb (96.2 kg)   LMP  (LMP Unknown)   SpO2 97%   BMI 39.41 kg/m  Wt Readings from Last 3 Encounters:  09/30/23 212 lb (96.2 kg)  08/02/23 216 lb (98 kg)  05/09/23 216 lb 3.2 oz (98.1 kg)      Dulce Sellar, NP

## 2023-10-18 DIAGNOSIS — M25572 Pain in left ankle and joints of left foot: Secondary | ICD-10-CM | POA: Diagnosis not present

## 2023-11-04 ENCOUNTER — Ambulatory Visit: Payer: Medicare Other | Attending: Sports Medicine | Admitting: Physical Therapy

## 2023-11-04 ENCOUNTER — Other Ambulatory Visit: Payer: Self-pay

## 2023-11-04 DIAGNOSIS — M25572 Pain in left ankle and joints of left foot: Secondary | ICD-10-CM | POA: Diagnosis not present

## 2023-11-04 DIAGNOSIS — R252 Cramp and spasm: Secondary | ICD-10-CM | POA: Insufficient documentation

## 2023-11-04 DIAGNOSIS — R293 Abnormal posture: Secondary | ICD-10-CM | POA: Insufficient documentation

## 2023-11-04 DIAGNOSIS — R262 Difficulty in walking, not elsewhere classified: Secondary | ICD-10-CM | POA: Diagnosis not present

## 2023-11-04 DIAGNOSIS — M6281 Muscle weakness (generalized): Secondary | ICD-10-CM | POA: Diagnosis not present

## 2023-11-04 NOTE — Therapy (Signed)
OUTPATIENT PHYSICAL THERAPY LOWER EXTREMITY EVALUATION   Patient Name: Cynthia Berry MRN: [id number] DOB:[birth year], 78 y.o., female Today's Date: 11/04/2023  END OF SESSION:  PT End of Session - 11/04/23 1007     Visit Number 1    Date for PT Re-Evaluation 12/30/23    Authorization Type Medicare    Progress Note Due on Visit 10    PT Start Time 1009    PT Stop Time 1055    PT Time Calculation (min) 46 min    Activity Tolerance Patient tolerated treatment well             Past Medical History:  Diagnosis Date   Anemia    Anxiety    Pt very anxious/sensitive to certain meds!   COVID 2022   Depression    Hx of adenomatous polyp of colon 04/30/2016   Hyperlipidemia    Hypertension    Post-operative nausea and vomiting    due to nerves   Past Surgical History:  Procedure Laterality Date   TONSILLECTOMY     as child   Patient Active Problem List   Diagnosis Date Noted   Osteopenia 03/27/2019   Morbid obesity (HCC) 04/19/2017   Hx of adenomatous polyp of colon 04/30/2016   Allergic rhinitis 12/28/2014   Gout 12/28/2014   Hyperglycemia 08/31/2013   Hyperlipidemia 09/30/2009   Depression 05/22/2007   Essential hypertension 05/22/2007    PCP: Shelva Majestic MD  REFERRING PROVIDER: Rodolph Bong MD  REFERRING DIAG: posterior tibialis dysfunction; left ankle pain M25,572   THERAPY DIAG:  Pain in left ankle and joints of left foot  Muscle weakness (generalized)  Difficulty in walking, not elsewhere classified  Rationale for Evaluation and Treatment: Rehabilitation  ONSET DATE: Early November  SUBJECTIVE:   SUBJECTIVE STATEMENT: Started early November unsure of what caused this, possibly from stepping down too hard.  Got some things from the drug store to help with plantar fascitis but this feels different and it worsened.  [facility] gave me a boot for 3 weeks and got relief right away.  Without boot it reverted back.  Went on a trip and used ankle  support that laced up and velcroed.    I don't do stairs b/c I have weak knees typically one at a time, not painful- had PT for LE strengthening and never could do stairs reciprocally, I didn't stick with it  I've joined [employer] for 2 years but I haven't gone yet  PERTINENT HISTORY: History of left plantar fascitis; HTN; osteopenia; depression; I had torn ligaments in college right leg PAIN:  Are you having pain? Yes NPRS scale: 2/10;  with standing/walking pain 6-7/10 Pain location: left medial ankle pain and swelling  Pain orientation: Left and Medial  PAIN TYPE: burning Pain description: burning  Aggravating factors: standing a long time; turn foot inward, walking a lot (a day long without resting);hobble at first after sitting Relieving factors: boot, ankle support  PRECAUTIONS: None   WEIGHT BEARING RESTRICTIONS: No  FALLS:  Has patient fallen in last 6 months? No  LIVING ENVIRONMENT: Has upstairs but bedroom downstairs  Leisure: travel, stay on the go; I don't like to walk though  OCCUPATION: retired Cabin crew ed Runner, broadcasting/film/video  PLOF: Independent  PATIENT GOALS: strengthen my legs; get around like I want to; be able to go on step without buckling  NEXT MD VISIT: no scheduled follow up  OBJECTIVE:  Note: Objective measures were completed at Evaluation unless otherwise noted.  DIAGNOSTIC FINDINGS:  none  PATIENT SURVEYS:  LEFS 48/80  COGNITION: Overall cognitive status: Within functional limits for tasks assessed     EDEMA:  Medial left ankle moderate   POSTURE: moderate to severe pes planus bil Unable to single leg stand on left, in addition to medial ankle collapse left pelvic drop indicating gluteal weakness  PALPATION: Marked tenderness posterior tibialis  LOWER EXTREMITY ROM: ankle ROM WFLS although ankle inversion is painful; decreased gastroc muscle length  LOWER EXTREMITY MMT:   Ankle: DF 4/5; PF 4-/5, Inv 3+/5, eversion 4/5 Foot intrinsics  4-/5  GAIT: Comments: wearing shows without arch support                                                                                                                                TREATMENT DATE: 11/04/23  Discussed footwear: stability shoe with arch support Anti inflammatory strategies Initial HEP  PATIENT EDUCATION:  Education details: Educated patient on anatomy and physiology of current symptoms, prognosis, plan of care as well as initial self care strategies to promote recovery  Person educated: Patient Education method: Explanation Education comprehension: verbalized understanding  HOME EXERCISE PROGRAM: Access Code: [email or web address] URL: [email or web address] Date: 11/04/2023 Prepared by: Lavinia Sharps  Exercises - Isometric Ankle Inversion  - 1 x daily - 7 x weekly - 1 sets - 10 reps - 5 hold - Seated Arch Lifts  - 1 x daily - 7 x weekly - 1 sets - 10 reps - 5 hold - Seated Toe Towel Scrunches  - 1 x daily - 7 x weekly - 1 sets - 10 reps - Toe Yoga - Alternating Great Toe and Lesser Toe Extension  - 1 x daily - 7 x weekly - 1 sets - 10 reps - Standing Heel Raise with Support  - 1 x daily - 7 x weekly - 1 sets - 10 reps - 5 hold - Single Leg Stance with Support  - 1 x daily - 7 x weekly - 1 sets - 10 reps - 5 hold - Standing Gastroc Stretch at Counter  - 1 x daily - 7 x weekly - 3 sets - 10 reps - Clamshell  - 1 x daily - 7 x weekly - 1 sets - 10 reps - Sidelying Hip Abduction  - 1 x daily - 7 x weekly - 1 sets - 10 reps  ASSESSMENT:  CLINICAL IMPRESSION: Patient is a 78 y.o. female who was seen today for physical therapy evaluation and treatment for left ankle pain related to posterior tibialis dysfunction. Symptoms worsened with standing, walking and ankle inversion.  Moderate to severe pes planus evident in weight bearing.  Painful tendinous insertion of posterior tib tendon.  Decreased ankle and foot intrinsic strength.  Weakness in gluteal and quad strength  also evident which further affects ankle/foot alignment and medial ankle collapse. She would benefit from to PT to address these impairments.  OBJECTIVE IMPAIRMENTS: decreased activity tolerance, difficulty walking,  decreased strength, increased edema, increased fascial restrictions, impaired perceived functional ability, postural dysfunction, and pain.   ACTIVITY LIMITATIONS: standing, stairs, and locomotion level  PARTICIPATION LIMITATIONS: meal prep, cleaning, laundry, shopping, and community activity  PERSONAL FACTORS: 1-2 comorbidities: previous history of plantar fascitis, no prior ex program  are also affecting patient's functional outcome.   REHAB POTENTIAL: Good  CLINICAL DECISION MAKING: Stable/uncomplicated  EVALUATION COMPLEXITY: Low   GOALS: Goals reviewed with patient? Yes  SHORT TERM GOALS: Target date: 12/02/2023   The patient will demonstrate knowledge of basic self care strategies and exercises to promote healing  Baseline: Goal status: INITIAL  2.  The patient will report a 30% improvement in pain levels with functional activities which are currently difficult including standing and walking Baseline:  Goal status: INITIAL  3.  Improved postural alignment and strength to allow for standing and walking throughout the day with moderate difficulty rather than quite a bit of difficulty Baseline:  Goal status: INITIAL    LONG TERM GOALS: Target date: 12/30/2023   The patient will be independent in a safe self progression of a home exercise program to promote further recovery of function  Baseline:  Goal status: INITIAL  2.  The patient will report a 60% improvement in pain levels with functional activities which are currently difficult including standing and walking Baseline:  Goal status: INITIAL  3.  The patient will have improved LE strength needed to ascend and descend steps reciprocally  Baseline:  Goal status: INITIAL  4.  Patient able to walk 1  mile with pain level <5/10 Baseline:  Goal status: INITIAL  5.  LEFS improved to  60/80 indicating improved function with less pain Baseline:  Goal status: INITIAL    PLAN:  PT FREQUENCY: 2x/week  PT DURATION: 8 weeks  PLANNED INTERVENTIONS: 97164- PT Re-evaluation, 97110-Therapeutic exercises, 97530- Therapeutic activity, 97112- Neuromuscular re-education, 97535- Self Care, 84696- Manual therapy, [phone]- Aquatic Therapy, 97014- Electrical stimulation (unattended), [id number]- Electrical stimulation (manual), [id number]- Ultrasound, 41324- Ionotophoresis 4mg /ml Dexamethasone, Patient/Family education, Taping, Dry Needling, Joint mobilization, Cryotherapy, and Moist heat  PLAN FOR NEXT SESSION: check 5x STS and TUG for baselines;  ankle/foot strengthening; continue education on supportive shoe with arch support; glute and quad strength; give Start up [employer] (already a member)  Lavinia Sharps, PT 11/04/23 12:44 PM Phone: [phone] Fax: [phone]

## 2023-11-06 ENCOUNTER — Encounter: Payer: Self-pay | Admitting: Internal Medicine

## 2023-11-09 ENCOUNTER — Ambulatory Visit: Payer: Medicare Other

## 2023-11-09 DIAGNOSIS — M25572 Pain in left ankle and joints of left foot: Secondary | ICD-10-CM

## 2023-11-09 DIAGNOSIS — R252 Cramp and spasm: Secondary | ICD-10-CM

## 2023-11-09 DIAGNOSIS — R262 Difficulty in walking, not elsewhere classified: Secondary | ICD-10-CM | POA: Diagnosis not present

## 2023-11-09 DIAGNOSIS — R293 Abnormal posture: Secondary | ICD-10-CM | POA: Diagnosis not present

## 2023-11-09 DIAGNOSIS — M6281 Muscle weakness (generalized): Secondary | ICD-10-CM

## 2023-11-09 NOTE — Therapy (Signed)
OUTPATIENT PHYSICAL THERAPY LOWER EXTREMITY EVALUATION   Patient Name: Cynthia Berry MRN: [id number] DOB:Aug 27, 1946, 78 y.o., female Today's Date: 11/09/2023  END OF SESSION:  PT End of Session - 11/09/23 0848     Visit Number 2    Date for PT Re-Evaluation 12/30/23    Authorization Type Medicare    Progress Note Due on Visit 10    PT Start Time [phone]    PT Stop Time 0935    PT Time Calculation (min) 46 min    Activity Tolerance Patient tolerated treatment well    Behavior During Therapy [facility] for tasks assessed/performed             Past Medical History:  Diagnosis Date   Anemia    Anxiety    Pt very anxious/sensitive to certain meds!   COVID 2022   Depression    Hx of adenomatous polyp of colon 04/30/2016   Hyperlipidemia    Hypertension    Post-operative nausea and vomiting    due to nerves   Past Surgical History:  Procedure Laterality Date   TONSILLECTOMY     as child   Patient Active Problem List   Diagnosis Date Noted   Osteopenia 03/27/2019   Morbid obesity (HCC) 04/19/2017   Hx of adenomatous polyp of colon 04/30/2016   Allergic rhinitis 12/28/2014   Gout 12/28/2014   Hyperglycemia 08/31/2013   Hyperlipidemia 09/30/2009   Depression 05/22/2007   Essential hypertension 05/22/2007    PCP: Shelva Majestic MD  REFERRING PROVIDER: Rodolph Bong MD  REFERRING DIAG: posterior tibialis dysfunction; left ankle pain M25,572   THERAPY DIAG:  Pain in left ankle and joints of left foot  Difficulty in walking, not elsewhere classified  Muscle weakness (generalized)  Cramp and spasm  Abnormal posture  Rationale for Evaluation and Treatment: Rehabilitation  ONSET DATE: Early November  SUBJECTIVE:   SUBJECTIVE STATEMENT: Started early November unsure of what caused this, possibly from stepping down too hard.  Got some things from the drug store to help with plantar fascitis but this feels different and it worsened.  [facility] gave me a boot for 3  weeks and got relief right away.  Without boot it reverted back.  Went on a trip and used ankle support that laced up and velcroed.    I don't do stairs b/c I have weak knees typically one at a time, not painful- had PT for LE strengthening and never could do stairs reciprocally, I didn't stick with it  I've joined [employer] for 2 years but I haven't gone yet  PERTINENT HISTORY: History of left plantar fascitis; HTN; osteopenia; depression; I had torn ligaments in college right leg PAIN:  Are you having pain? Yes NPRS scale: 2/10;  with standing/walking pain 6-7/10 Pain location: left medial ankle pain and swelling  Pain orientation: Left and Medial  PAIN TYPE: burning Pain description: burning  Aggravating factors: standing a long time; turn foot inward, walking a lot (a day long without resting);hobble at first after sitting Relieving factors: boot, ankle support  PRECAUTIONS: None   WEIGHT BEARING RESTRICTIONS: No  FALLS:  Has patient fallen in last 6 months? No  LIVING ENVIRONMENT: Has upstairs but bedroom downstairs  Leisure: travel, stay on the go; I don't like to walk though  OCCUPATION: retired Cabin crew ed Runner, broadcasting/film/video  PLOF: Independent  PATIENT GOALS: strengthen my legs; get around like I want to; be able to go on step without buckling  NEXT MD VISIT: no scheduled follow  up  OBJECTIVE:  Note: Objective measures were completed at Evaluation unless otherwise noted.  DIAGNOSTIC FINDINGS: none  PATIENT SURVEYS:  LEFS 48/80  COGNITION: Overall cognitive status: Within functional limits for tasks assessed     EDEMA:  Medial left ankle moderate   POSTURE: moderate to severe pes planus bil Unable to single leg stand on left, in addition to medial ankle collapse left pelvic drop indicating gluteal weakness  PALPATION: Marked tenderness posterior tibialis  LOWER EXTREMITY ROM: ankle ROM WFLS although ankle inversion is painful; decreased gastroc muscle  length  LOWER EXTREMITY MMT:   Ankle: DF 4/5; PF 4-/5, Inv 3+/5, eversion 4/5 Foot intrinsics 4-/5  GAIT: Comments: wearing shows without arch support  FUNCTIONAL TESTS: 5X SIT TO STAND: 12.45 sed  TUG: 8.65 sec                                                                                                                                TREATMENT DATE: 11/09/23 Completed the 5x sit to stand and TUG test Nustep x 7 min level 2  Rocker board DF/PF x 2 min Rocker board propped on balance pad gastroc stretch 10 x holding 10 sec Rocker board propped on balance pad soleus stretch 10 x holding 10 sec Lateral band walks with blue loop x 3 laps along barre SLS at barre x 10 attempting to hold 5 sec Step up and hold on balance pad (left foot leading) fwd  x 10 then lateral  x 10 Marching on balance pad 2 x 20 (second set, encouraged patient to do higher knee lifts)  TREATMENT DATE: 11/04/23  Discussed footwear: stability shoe with arch support Anti inflammatory strategies Initial HEP  PATIENT EDUCATION:  Education details: Educated patient on anatomy and physiology of current symptoms, prognosis, plan of care as well as initial self care strategies to promote recovery  Person educated: Patient Education method: Explanation Education comprehension: verbalized understanding  HOME EXERCISE PROGRAM: Access Code: [email or web address] URL: [email or web address] Date: 11/04/2023 Prepared by: Lavinia Sharps  Exercises - Isometric Ankle Inversion  - 1 x daily - 7 x weekly - 1 sets - 10 reps - 5 hold - Seated Arch Lifts  - 1 x daily - 7 x weekly - 1 sets - 10 reps - 5 hold - Seated Toe Towel Scrunches  - 1 x daily - 7 x weekly - 1 sets - 10 reps - Toe Yoga - Alternating Great Toe and Lesser Toe Extension  - 1 x daily - 7 x weekly - 1 sets - 10 reps - Standing Heel Raise with Support  - 1 x daily - 7 x weekly - 1 sets - 10 reps - 5 hold - Single Leg Stance with Support  - 1 x daily - 7 x  weekly - 1 sets - 10 reps - 5 hold - Standing Gastroc Stretch at Counter  - 1 x daily - 7 x weekly - 3 sets -  10 reps - Clamshell  - 1 x daily - 7 x weekly - 1 sets - 10 reps - Sidelying Hip Abduction  - 1 x daily - 7 x weekly - 1 sets - 10 reps  ASSESSMENT:  CLINICAL IMPRESSION: Winnie did very well today.  She was able to maintain SLS on left x 5 seconds several reps without pain.  She was also able to do balance pad activities with min lob and mostly no UE support.  She needed new copies of her HEP due to misplaced them at home.   Weakness in gluteal and quad strength still evident which affects ankle/foot alignment and medial ankle collapse. She would benefit from skilled PT for quad and hip strengthening along with ankle stability to address these impairments.  OBJECTIVE IMPAIRMENTS: decreased activity tolerance, difficulty walking, decreased strength, increased edema, increased fascial restrictions, impaired perceived functional ability, postural dysfunction, and pain.   ACTIVITY LIMITATIONS: standing, stairs, and locomotion level  PARTICIPATION LIMITATIONS: meal prep, cleaning, laundry, shopping, and community activity  PERSONAL FACTORS: 1-2 comorbidities: previous history of plantar fascitis, no prior ex program  are also affecting patient's functional outcome.   REHAB POTENTIAL: Good  CLINICAL DECISION MAKING: Stable/uncomplicated  EVALUATION COMPLEXITY: Low   GOALS: Goals reviewed with patient? Yes  SHORT TERM GOALS: Target date: 12/02/2023   The patient will demonstrate knowledge of basic self care strategies and exercises to promote healing  Baseline: Goal status: INITIAL  2.  The patient will report a 30% improvement in pain levels with functional activities which are currently difficult including standing and walking Baseline:  Goal status: INITIAL  3.  Improved postural alignment and strength to allow for standing and walking throughout the day with moderate  difficulty rather than quite a bit of difficulty Baseline:  Goal status: INITIAL    LONG TERM GOALS: Target date: 12/30/2023   The patient will be independent in a safe self progression of a home exercise program to promote further recovery of function  Baseline:  Goal status: INITIAL  2.  The patient will report a 60% improvement in pain levels with functional activities which are currently difficult including standing and walking Baseline:  Goal status: INITIAL  3.  The patient will have improved LE strength needed to ascend and descend steps reciprocally  Baseline:  Goal status: INITIAL  4.  Patient able to walk 1 mile with pain level <5/10 Baseline:  Goal status: INITIAL  5.  LEFS improved to  60/80 indicating improved function with less pain Baseline:  Goal status: INITIAL    PLAN:  PT FREQUENCY: 2x/week  PT DURATION: 8 weeks  PLANNED INTERVENTIONS: 97164- PT Re-evaluation, 97110-Therapeutic exercises, 97530- Therapeutic activity, 97112- Neuromuscular re-education, 97535- Self Care, 29562- Manual therapy, [id number]- Aquatic Therapy, 97014- Electrical stimulation (unattended), [id number]- Electrical stimulation (manual), [id number]- Ultrasound, 13086- Ionotophoresis 4mg /ml Dexamethasone, Patient/Family education, Taping, Dry Needling, Joint mobilization, Cryotherapy, and Moist heat  PLAN FOR NEXT SESSION: Ankle/foot strengthening; continue education on supportive shoe with arch support; glute and quad strength; forgot to give Start up [employer] B. Mayci Haning, PT 11/09/23 9:47 AM [facility] [address] [place], [place] [address] Phone # [phone] Fax [phone]

## 2023-11-15 ENCOUNTER — Encounter: Payer: Medicare Other | Admitting: Physical Therapy

## 2023-11-17 ENCOUNTER — Ambulatory Visit: Payer: Medicare Other | Attending: Sports Medicine

## 2023-11-17 DIAGNOSIS — R262 Difficulty in walking, not elsewhere classified: Secondary | ICD-10-CM | POA: Diagnosis not present

## 2023-11-17 DIAGNOSIS — R252 Cramp and spasm: Secondary | ICD-10-CM | POA: Insufficient documentation

## 2023-11-17 DIAGNOSIS — M6281 Muscle weakness (generalized): Secondary | ICD-10-CM | POA: Insufficient documentation

## 2023-11-17 DIAGNOSIS — M25572 Pain in left ankle and joints of left foot: Secondary | ICD-10-CM | POA: Insufficient documentation

## 2023-11-17 DIAGNOSIS — R293 Abnormal posture: Secondary | ICD-10-CM | POA: Insufficient documentation

## 2023-11-17 NOTE — Therapy (Signed)
 OUTPATIENT PHYSICAL THERAPY LOWER EXTREMITY EVALUATION   Patient Name: Cynthia Berry MRN: [id number] DOB:1946-02-27, 78 y.o., female Today's Date: 11/17/2023  END OF SESSION:  PT End of Session - 11/17/23 0848     Visit Number 3    Date for PT Re-Evaluation 12/30/23    Authorization Type Medicare    Progress Note Due on Visit 10    PT Start Time 0848    PT Stop Time 0930    PT Time Calculation (min) 42 min    Activity Tolerance Patient tolerated treatment well    Behavior During Therapy WFL for tasks assessed/performed             Past Medical History:  Diagnosis Date   Anemia    Anxiety    Pt very anxious/sensitive to certain meds!   COVID 2022   Depression    Hx of adenomatous polyp of colon 04/30/2016   Hyperlipidemia    Hypertension    Post-operative nausea and vomiting    due to nerves   Past Surgical History:  Procedure Laterality Date   TONSILLECTOMY     as child   Patient Active Problem List   Diagnosis Date Noted   Osteopenia 03/27/2019   Morbid obesity (HCC) 04/19/2017   Hx of adenomatous polyp of colon 04/30/2016   Allergic rhinitis 12/28/2014   Gout 12/28/2014   Hyperglycemia 08/31/2013   Hyperlipidemia 09/30/2009   Depression 05/22/2007   Essential hypertension 05/22/2007    PCP: Katrinka Garnette KIDD MD  REFERRING PROVIDER: Arnaldo Cornet MD  REFERRING DIAG: posterior tibialis dysfunction; left ankle pain M25,572   THERAPY DIAG:  Pain in left ankle and joints of left foot  Difficulty in walking, not elsewhere classified  Muscle weakness (generalized)  Cramp and spasm  Abnormal posture  Rationale for Evaluation and Treatment: Rehabilitation  ONSET DATE: Early November  SUBJECTIVE:   SUBJECTIVE STATEMENT: Patient reports no pain.  She states she only gets pain when she is up walking a lot.  Checked on classes at [employer] but times have changed.  Working on setting that up.    I don't do stairs b/c I have weak knees typically one at  a time, not painful- had PT for LE strengthening and never could do stairs reciprocally, I didn't stick with it  I've joined [employer] for 2 years but I haven't gone yet  PERTINENT HISTORY: History of left plantar fascitis; HTN; osteopenia; depression; I had torn ligaments in college right leg PAIN:  11/17/23: Are you having pain? Yes NPRS scale: 0/10;  with standing/walking pain 6-7/10 Pain location: left medial ankle pain and swelling  Pain orientation: Left and Medial  PAIN TYPE: burning Pain description: burning  Aggravating factors: standing a long time; turn foot inward, walking a lot (a day long without resting);hobble at first after sitting Relieving factors: boot, ankle support  PRECAUTIONS: None   WEIGHT BEARING RESTRICTIONS: No  FALLS:  Has patient fallen in last 6 months? No  LIVING ENVIRONMENT: Has upstairs but bedroom downstairs  Leisure: travel, stay on the go; I don't like to walk though  OCCUPATION: retired cabin crew ed runner, broadcasting/film/video  PLOF: Independent  PATIENT GOALS: strengthen my legs; get around like I want to; be able to go on step without buckling  NEXT MD VISIT: no scheduled follow up  OBJECTIVE:  Note: Objective measures were completed at Evaluation unless otherwise noted.  DIAGNOSTIC FINDINGS: none  PATIENT SURVEYS:  LEFS 48/80  COGNITION: Overall cognitive status: Within functional limits for  tasks assessed     EDEMA:  Medial left ankle moderate   POSTURE: moderate to severe pes planus bil Unable to single leg stand on left, in addition to medial ankle collapse left pelvic drop indicating gluteal weakness  PALPATION: Marked tenderness posterior tibialis  LOWER EXTREMITY ROM: ankle ROM WFLS although ankle inversion is painful; decreased gastroc muscle length  LOWER EXTREMITY MMT:   Ankle: DF 4/5; PF 4-/5, Inv 3+/5, eversion 4/5 Foot intrinsics 4-/5  GAIT: Comments: wearing shows without arch support  FUNCTIONAL TESTS: 5X SIT TO  STAND: 12.45 sed  TUG: 8.65 sec   TREATMENT DATE: 11/17/23 Nustep x 7 min level 2  Rocker board DF/PF x 2 min Lunge to balance pad x 10 each LE fwd and then x 10 each LE lateral  Step up and hold on balance pad fwd  x 10 then lateral  x 10 Lateral band walks with blue loop x 5 laps along barre Seated 4 way ankle with yellow tband x 20 each dir Seated toe and heel raises x 20 each Rocker board propped on balance pad gastroc stretch 10 x holding 10 sec Rocker board propped on balance pad soleus stretch 10 x holding 10 sec                                                                                                                           TREATMENT DATE: 11/09/23 Completed the 5x sit to stand and TUG test Nustep x 7 min level 2  Rocker board DF/PF x 2 min Rocker board propped on balance pad gastroc stretch 10 x holding 10 sec Rocker board propped on balance pad soleus stretch 10 x holding 10 sec Lateral band walks with blue loop x 3 laps along barre SLS at barre x 10 attempting to hold 5 sec Step up and hold on balance pad (left foot leading) fwd  x 10 then lateral  x 10 Marching on balance pad 2 x 20 (second set, encouraged patient to do higher knee lifts)  TREATMENT DATE: 11/04/23  Discussed footwear: stability shoe with arch support Anti inflammatory strategies Initial HEP  PATIENT EDUCATION:  Education details: Educated patient on anatomy and physiology of current symptoms, prognosis, plan of care as well as initial self care strategies to promote recovery  Person educated: Patient Education method: Explanation Education comprehension: verbalized understanding  HOME EXERCISE PROGRAM: Access Code: [email or web address] URL: [email or web address] Date: 11/04/2023 Prepared by: Glade Pesa  Exercises - Isometric Ankle Inversion  - 1 x daily - 7 x weekly - 1 sets - 10 reps - 5 hold - Seated Arch Lifts  - 1 x daily - 7 x weekly - 1 sets - 10 reps - 5 hold - Seated Toe  Towel Scrunches  - 1 x daily - 7 x weekly - 1 sets - 10 reps - Toe Yoga - Alternating Great Toe and Lesser Toe Extension  - 1 x daily - 7 x weekly - 1 sets -  10 reps - Standing Heel Raise with Support  - 1 x daily - 7 x weekly - 1 sets - 10 reps - 5 hold - Single Leg Stance with Support  - 1 x daily - 7 x weekly - 1 sets - 10 reps - 5 hold - Standing Gastroc Stretch at Counter  - 1 x daily - 7 x weekly - 3 sets - 10 reps - Clamshell  - 1 x daily - 7 x weekly - 1 sets - 10 reps - Sidelying Hip Abduction  - 1 x daily - 7 x weekly - 1 sets - 10 reps  ASSESSMENT:  CLINICAL IMPRESSION: Cynthia Berry is progressing appropriately.  She follows instruction very well and demonstrates excellent form on all tasks.  She is reporting 0/10 pain most of the time but still having 6-7/10 She would benefit from skilled PT for quad and hip strengthening along with ankle stability to address these impairments.  OBJECTIVE IMPAIRMENTS: decreased activity tolerance, difficulty walking, decreased strength, increased edema, increased fascial restrictions, impaired perceived functional ability, postural dysfunction, and pain.   ACTIVITY LIMITATIONS: standing, stairs, and locomotion level  PARTICIPATION LIMITATIONS: meal prep, cleaning, laundry, shopping, and community activity  PERSONAL FACTORS: 1-2 comorbidities: previous history of plantar fascitis, no prior ex program  are also affecting patient's functional outcome.   REHAB POTENTIAL: Good  CLINICAL DECISION MAKING: Stable/uncomplicated  EVALUATION COMPLEXITY: Low   GOALS: Goals reviewed with patient? Yes  SHORT TERM GOALS: Target date: 12/02/2023   The patient will demonstrate knowledge of basic self care strategies and exercises to promote healing  Baseline: Goal status: MET 11/17/23  2.  The patient will report a 30% improvement in pain levels with functional activities which are currently difficult including standing and walking Baseline:  Goal status: In  progress 11/17/23  3.  Improved postural alignment and strength to allow for standing and walking throughout the day with moderate difficulty rather than quite a bit of difficulty Baseline:  Goal status: In progress 11/17/23    LONG TERM GOALS: Target date: 12/30/2023   The patient will be independent in a safe self progression of a home exercise program to promote further recovery of function  Baseline:  Goal status: INITIAL  2.  The patient will report a 60% improvement in pain levels with functional activities which are currently difficult including standing and walking Baseline:  Goal status: INITIAL  3.  The patient will have improved LE strength needed to ascend and descend steps reciprocally  Baseline:  Goal status: INITIAL  4.  Patient able to walk 1 mile with pain level <5/10 Baseline:  Goal status: INITIAL  5.  LEFS improved to  60/80 indicating improved function with less pain Baseline:  Goal status: INITIAL    PLAN:  PT FREQUENCY: 2x/week  PT DURATION: 8 weeks  PLANNED INTERVENTIONS: 97164- PT Re-evaluation, 97110-Therapeutic exercises, 97530- Therapeutic activity, [id number]- Neuromuscular re-education, 97535- Self Care, 02859- Manual therapy, [id number]- Aquatic Therapy, 97014- Electrical stimulation (unattended), [id number]- Electrical stimulation (manual), [id number]- Ultrasound, 02966- Ionotophoresis 4mg /ml Dexamethasone , Patient/Family education, Taping, Dry Needling, Joint mobilization, Cryotherapy, and Moist heat  PLAN FOR NEXT SESSION: Ankle/foot strengthening; knee and hip strength for improved alignment, continue education on supportive shoe with arch support; glute and quad strength  Marjie Chea B. Kimbra Marcelino, PT 11/17/23 10:59 AM [facility] [address], [address] [place], [place] [address] Phone # [phone] Fax [phone]

## 2023-11-18 ENCOUNTER — Ambulatory Visit: Payer: Medicare Other

## 2023-11-18 DIAGNOSIS — R293 Abnormal posture: Secondary | ICD-10-CM

## 2023-11-18 DIAGNOSIS — R262 Difficulty in walking, not elsewhere classified: Secondary | ICD-10-CM | POA: Diagnosis not present

## 2023-11-18 DIAGNOSIS — M6281 Muscle weakness (generalized): Secondary | ICD-10-CM

## 2023-11-18 DIAGNOSIS — R252 Cramp and spasm: Secondary | ICD-10-CM

## 2023-11-18 DIAGNOSIS — M25572 Pain in left ankle and joints of left foot: Secondary | ICD-10-CM

## 2023-11-18 NOTE — Therapy (Signed)
 OUTPATIENT PHYSICAL THERAPY LOWER EXTREMITY EVALUATION   Patient Name: Cynthia Berry MRN: [id number] DOB:10-Aug-1946, 78 y.o., female Today's Date: 11/18/2023  END OF SESSION:  PT End of Session - 11/18/23 1017     Visit Number 4    Date for PT Re-Evaluation 12/30/23    Authorization Type Medicare    Progress Note Due on Visit 10    PT Start Time 1017    PT Stop Time 1055    PT Time Calculation (min) 38 min    Activity Tolerance Patient tolerated treatment well    Behavior During Therapy WFL for tasks assessed/performed             Past Medical History:  Diagnosis Date   Anemia    Anxiety    Pt very anxious/sensitive to certain meds!   COVID 2022   Depression    Hx of adenomatous polyp of colon 04/30/2016   Hyperlipidemia    Hypertension    Post-operative nausea and vomiting    due to nerves   Past Surgical History:  Procedure Laterality Date   TONSILLECTOMY     as child   Patient Active Problem List   Diagnosis Date Noted   Osteopenia 03/27/2019   Morbid obesity (HCC) 04/19/2017   Hx of adenomatous polyp of colon 04/30/2016   Allergic rhinitis 12/28/2014   Gout 12/28/2014   Hyperglycemia 08/31/2013   Hyperlipidemia 09/30/2009   Depression 05/22/2007   Essential hypertension 05/22/2007    PCP: Katrinka Garnette KIDD MD  REFERRING PROVIDER: Arnaldo Cornet MD  REFERRING DIAG: posterior tibialis dysfunction; left ankle pain M25,572   THERAPY DIAG:  Pain in left ankle and joints of left foot  Difficulty in walking, not elsewhere classified  Muscle weakness (generalized)  Cramp and spasm  Abnormal posture  Rationale for Evaluation and Treatment: Rehabilitation  ONSET DATE: Early November  SUBJECTIVE:   SUBJECTIVE STATEMENT: Patient reports a little burning today  Locates this at medial ankle just posterior to malleolus.     I don't do stairs b/c I have weak knees typically one at a time, not painful- had PT for LE strengthening and never could do  stairs reciprocally, I didn't stick with it  I've joined [employer] for 2 years but I haven't gone yet  PERTINENT HISTORY: History of left plantar fascitis; HTN; osteopenia; depression; I had torn ligaments in college right leg PAIN:  11/17/23: Are you having pain? Yes NPRS scale: 0/10;  with standing/walking pain 6-7/10 Pain location: left medial ankle pain and swelling  Pain orientation: Left and Medial  PAIN TYPE: burning Pain description: burning  Aggravating factors: standing a long time; turn foot inward, walking a lot (a day long without resting);hobble at first after sitting Relieving factors: boot, ankle support  PRECAUTIONS: None   WEIGHT BEARING RESTRICTIONS: No  FALLS:  Has patient fallen in last 6 months? No  LIVING ENVIRONMENT: Has upstairs but bedroom downstairs  Leisure: travel, stay on the go; I don't like to walk though  OCCUPATION: retired cabin crew ed runner, broadcasting/film/video  PLOF: Independent  PATIENT GOALS: strengthen my legs; get around like I want to; be able to go on step without buckling  NEXT MD VISIT: no scheduled follow up  OBJECTIVE:  Note: Objective measures were completed at Evaluation unless otherwise noted.  DIAGNOSTIC FINDINGS: none  PATIENT SURVEYS:  LEFS 48/80  COGNITION: Overall cognitive status: Within functional limits for tasks assessed     EDEMA:  Medial left ankle moderate   POSTURE: moderate to  severe pes planus bil Unable to single leg stand on left, in addition to medial ankle collapse left pelvic drop indicating gluteal weakness  PALPATION: Marked tenderness posterior tibialis  LOWER EXTREMITY ROM: ankle ROM WFLS although ankle inversion is painful; decreased gastroc muscle length  LOWER EXTREMITY MMT:   Ankle: DF 4/5; PF 4-/5, Inv 3+/5, eversion 4/5 Foot intrinsics 4-/5  GAIT: Comments: wearing shows without arch support  FUNCTIONAL TESTS: 5X SIT TO STAND: 12.45 sed  TUG: 8.65 sec  TREATMENT DATE: 11/18/23 Recumbent  bike x 7 min level 2  Seated rocker board DF/PF, then inv/ev x 2 min Towel push inv/ev 2 x 10 each direction Toe scrunches 2 x 20 Seated circular BAPS board x 2 min cw, then 2 min ccw Reviewed HEP and when to wear brace Ionto with dexamethasone  (6 hour wear time) to medial ankle in the area of the posterior tibial tendon, instruction on wearing time and any adverse effects , to remove  TREATMENT DATE: 11/17/23 Nustep x 7 min level 2  Rocker board DF/PF x 2 min Lunge to balance pad x 10 each LE fwd and then x 10 each LE lateral  Step up and hold on balance pad fwd  x 10 then lateral  x 10 Lateral band walks with blue loop x 5 laps along barre Seated 4 way ankle with yellow tband x 20 each dir Seated toe and heel raises x 20 each Rocker board propped on balance pad gastroc stretch 10 x holding 10 sec Rocker board propped on balance pad soleus stretch 10 x holding 10 sec                                                                                                                           TREATMENT DATE: 11/09/23 Completed the 5x sit to stand and TUG test Nustep x 7 min level 2  Rocker board DF/PF x 2 min Rocker board propped on balance pad gastroc stretch 10 x holding 10 sec Rocker board propped on balance pad soleus stretch 10 x holding 10 sec Lateral band walks with blue loop x 3 laps along barre SLS at barre x 10 attempting to hold 5 sec Step up and hold on balance pad (left foot leading) fwd  x 10 then lateral  x 10 Marching on balance pad 2 x 20 (second set, encouraged patient to do higher knee lifts)  TREATMENT DATE: 11/04/23  Discussed footwear: stability shoe with arch support Anti inflammatory strategies Initial HEP  PATIENT EDUCATION:  Education details: Educated patient on anatomy and physiology of current symptoms, prognosis, plan of care as well as initial self care strategies to promote recovery  Person educated: Patient Education method: Explanation Education  comprehension: verbalized understanding  HOME EXERCISE PROGRAM: Access Code: [email or web address] URL: [email or web address] Date: 11/04/2023 Prepared by: Glade Pesa  Exercises - Isometric Ankle Inversion  - 1 x daily - 7 x weekly - 1 sets - 10 reps - 5 hold -  Seated Arch Lifts  - 1 x daily - 7 x weekly - 1 sets - 10 reps - 5 hold - Seated Toe Towel Scrunches  - 1 x daily - 7 x weekly - 1 sets - 10 reps - Toe Yoga - Alternating Great Toe and Lesser Toe Extension  - 1 x daily - 7 x weekly - 1 sets - 10 reps - Standing Heel Raise with Support  - 1 x daily - 7 x weekly - 1 sets - 10 reps - 5 hold - Single Leg Stance with Support  - 1 x daily - 7 x weekly - 1 sets - 10 reps - 5 hold - Standing Gastroc Stretch at Counter  - 1 x daily - 7 x weekly - 3 sets - 10 reps - Clamshell  - 1 x daily - 7 x weekly - 1 sets - 10 reps - Sidelying Hip Abduction  - 1 x daily - 7 x weekly - 1 sets - 10 reps  ASSESSMENT:  CLINICAL IMPRESSION: Cynthia Berry is was having some increased medial ankle pain today.  We held on dynamic standing exercises and resistive exercise with the tband.  We added iontophoresis to control inflammation.   Cynthia Berry would benefit from skilled PT for quad and hip strengthening along with ankle stability to address these impairments.  OBJECTIVE IMPAIRMENTS: decreased activity tolerance, difficulty walking, decreased strength, increased edema, increased fascial restrictions, impaired perceived functional ability, postural dysfunction, and pain.   ACTIVITY LIMITATIONS: standing, stairs, and locomotion level  PARTICIPATION LIMITATIONS: meal prep, cleaning, laundry, shopping, and community activity  PERSONAL FACTORS: 1-2 comorbidities: previous history of plantar fascitis, no prior ex program  are also affecting patient's functional outcome.   REHAB POTENTIAL: Good  CLINICAL DECISION MAKING: Stable/uncomplicated  EVALUATION COMPLEXITY: Low   GOALS: Goals reviewed with patient?  Yes  SHORT TERM GOALS: Target date: 12/02/2023   The patient will demonstrate knowledge of basic self care strategies and exercises to promote healing  Baseline: Goal status: MET 11/17/23  2.  The patient will report a 30% improvement in pain levels with functional activities which are currently difficult including standing and walking Baseline:  Goal status: In progress 11/17/23  3.  Improved postural alignment and strength to allow for standing and walking throughout the day with moderate difficulty rather than quite a bit of difficulty Baseline:  Goal status: In progress 11/17/23    LONG TERM GOALS: Target date: 12/30/2023   The patient will be independent in a safe self progression of a home exercise program to promote further recovery of function  Baseline:  Goal status: INITIAL  2.  The patient will report a 60% improvement in pain levels with functional activities which are currently difficult including standing and walking Baseline:  Goal status: INITIAL  3.  The patient will have improved LE strength needed to ascend and descend steps reciprocally  Baseline:  Goal status: INITIAL  4.  Patient able to walk 1 mile with pain level <5/10 Baseline:  Goal status: INITIAL  5.  LEFS improved to  60/80 indicating improved function with less pain Baseline:  Goal status: INITIAL    PLAN:  PT FREQUENCY: 2x/week  PT DURATION: 8 weeks  PLANNED INTERVENTIONS: 97164- PT Re-evaluation, 97110-Therapeutic exercises, 97530- Therapeutic activity, 97112- Neuromuscular re-education, 97535- Self Care, 02859- Manual therapy, [id number]- Aquatic Therapy, 97014- Electrical stimulation (unattended), [id number]- Electrical stimulation (manual), [id number]- Ultrasound, 02966- Ionotophoresis 4mg /ml Dexamethasone , Patient/Family education, Taping, Dry Needling, Joint mobilization, Cryotherapy, and Moist heat  PLAN  FOR NEXT SESSION: Possibly DN posterior tib, Ankle/foot strengthening; knee and hip strength for  improved alignment, continue education on supportive shoe with arch support; glute and quad strength  Jaser Fullen B. Ingri Diemer, PT 11/18/23 11:10 AM [facility] [address], [address] [place], [place] [address] Phone # [phone] Fax [phone]

## 2023-11-22 ENCOUNTER — Ambulatory Visit: Payer: Medicare Other | Admitting: Physical Therapy

## 2023-11-22 DIAGNOSIS — R262 Difficulty in walking, not elsewhere classified: Secondary | ICD-10-CM | POA: Diagnosis not present

## 2023-11-22 DIAGNOSIS — M6281 Muscle weakness (generalized): Secondary | ICD-10-CM | POA: Diagnosis not present

## 2023-11-22 DIAGNOSIS — M25572 Pain in left ankle and joints of left foot: Secondary | ICD-10-CM

## 2023-11-22 DIAGNOSIS — R252 Cramp and spasm: Secondary | ICD-10-CM | POA: Diagnosis not present

## 2023-11-22 DIAGNOSIS — R293 Abnormal posture: Secondary | ICD-10-CM | POA: Diagnosis not present

## 2023-11-22 NOTE — Therapy (Signed)
OUTPATIENT PHYSICAL THERAPY LOWER EXTREMITY PROGRESS NOTE   Patient Name: Cynthia Berry MRN: [id number] DOB:09/29/1946, 78 y.o., female Today's Date: 11/22/2023  END OF SESSION:  PT End of Session - 11/22/23 1356     Visit Number 5    Date for PT Re-Evaluation 12/30/23    Authorization Type Medicare    Progress Note Due on Visit 10    PT Start Time 1400    PT Stop Time 1440    PT Time Calculation (min) 40 min    Activity Tolerance Patient tolerated treatment well             Past Medical History:  Diagnosis Date   Anemia    Anxiety    Pt very anxious/sensitive to certain meds!   COVID 2022   Depression    Hx of adenomatous polyp of colon 04/30/2016   Hyperlipidemia    Hypertension    Post-operative nausea and vomiting    due to nerves   Past Surgical History:  Procedure Laterality Date   TONSILLECTOMY     as child   Patient Active Problem List   Diagnosis Date Noted   Osteopenia 03/27/2019   Morbid obesity (HCC) 04/19/2017   Hx of adenomatous polyp of colon 04/30/2016   Allergic rhinitis 12/28/2014   Gout 12/28/2014   Hyperglycemia 08/31/2013   Hyperlipidemia 09/30/2009   Depression 05/22/2007   Essential hypertension 05/22/2007    PCP: Shelva Majestic MD  REFERRING PROVIDER: Rodolph Bong MD  REFERRING DIAG: posterior tibialis dysfunction; left ankle pain M25,572   THERAPY DIAG:  Pain in left ankle and joints of left foot  Difficulty in walking, not elsewhere classified  Muscle weakness (generalized)  Rationale for Evaluation and Treatment: Rehabilitation  ONSET DATE: Early November  SUBJECTIVE:   SUBJECTIVE STATEMENT: The patch helped last time.  No pain at the moment but it depends what I'm doing.  It has quieted down some but it still stings.  Wearing Easy Spirits today instead of the Skechers.     I don't do stairs b/c I have weak knees typically one at a time, not painful- had PT for LE strengthening and never could do stairs  reciprocally, I didn't stick with it  I've joined [employer] for 2 years but I haven't gone yet  PERTINENT HISTORY: History of left plantar fascitis; HTN; osteopenia; depression; I had torn ligaments in college right leg PAIN:   Are you having pain? Yes NPRS scale: 0/10;  moderate now vs. Severe;  with standing/walking pain 6-7/10 Pain location: left medial ankle pain and swelling  Pain orientation: Left and Medial  PAIN TYPE: burning Pain description: burning  Aggravating factors: standing a long time; turn foot inward, walking a lot (a day long without resting);hobble at first after sitting Relieving factors: boot, ankle support  PRECAUTIONS: None   WEIGHT BEARING RESTRICTIONS: No  FALLS:  Has patient fallen in last 6 months? No  LIVING ENVIRONMENT: Has upstairs but bedroom downstairs  Leisure: travel, stay on the go; I don't like to walk though  OCCUPATION: retired Cabin crew ed Runner, broadcasting/film/video  PLOF: Independent  PATIENT GOALS: strengthen my legs; get around like I want to; be able to go on step without buckling  NEXT MD VISIT: no scheduled follow up  OBJECTIVE:  Note: Objective measures were completed at Evaluation unless otherwise noted.  DIAGNOSTIC FINDINGS: none  PATIENT SURVEYS:  LEFS 48/80  COGNITION: Overall cognitive status: Within functional limits for tasks assessed     EDEMA:  Medial left ankle moderate   POSTURE: moderate to severe pes planus bil Unable to single leg stand on left, in addition to medial ankle collapse left pelvic drop indicating gluteal weakness  PALPATION: Marked tenderness posterior tibialis  LOWER EXTREMITY ROM: ankle ROM WFLS although ankle inversion is painful; decreased gastroc muscle length  LOWER EXTREMITY MMT:   Ankle: DF 4/5; PF 4-/5, Inv 3+/5, eversion 4/5 Foot intrinsics 4-/5  GAIT: Comments: wearing shows without arch support  FUNCTIONAL TESTS: 5X SIT TO STAND: 12.45 sed  TUG: 8.65 sec  TREATMENT DATE:  11/22/23 Nu-Step L3 10 min (while discussing expected course of recovery) Seated arch lifts (off pen) 10x 5 sec  Seated soleus raise with 5# kettlebell on knee 10x Seated purple ball squeeze isometric 5 sec hold 10x  Seated small green ball between heels squeeze with heel raise 10x SLS 5 sec 5x Airex foam side to side weight shift Airex foam staggered stance front/back weight shift Therapeutic activity: standing, walking, bearing weight on affected extremity   TREATMENT DATE: 11/18/23 Recumbent bike x 7 min level 2  Seated rocker board DF/PF, then inv/ev x 2 min Towel push inv/ev 2 x 10 each direction Toe scrunches 2 x 20 Seated circular BAPS board x 2 min cw, then 2 min ccw Reviewed HEP and when to wear brace Ionto with dexamethasone (6 hour wear time) to medial ankle in the area of the posterior tibial tendon, instruction on wearing time and any adverse effects , to remove  TREATMENT DATE: 11/17/23 Nustep x 7 min level 2  Rocker board DF/PF x 2 min Lunge to balance pad x 10 each LE fwd and then x 10 each LE lateral  Step up and hold on balance pad fwd  x 10 then lateral  x 10 Lateral band walks with blue loop x 5 laps along barre Seated 4 way ankle with yellow tband x 20 each dir Seated toe and heel raises x 20 each Rocker board propped on balance pad gastroc stretch 10 x holding 10 sec Rocker board propped on balance pad soleus stretch 10 x holding 10 sec                                                                                                                           TREATMENT DATE: 11/09/23 Completed the 5x sit to stand and TUG test Nustep x 7 min level 2  Rocker board DF/PF x 2 min Rocker board propped on balance pad gastroc stretch 10 x holding 10 sec Rocker board propped on balance pad soleus stretch 10 x holding 10 sec Lateral band walks with blue loop x 3 laps along barre SLS at barre x 10 attempting to hold 5 sec Step up and hold on balance pad (left foot leading)  fwd  x 10 then lateral  x 10 Marching on balance pad 2 x 20 (second set, encouraged patient to do higher knee lifts)  TREATMENT DATE: 11/04/23  Discussed footwear:  stability shoe with arch support Anti inflammatory strategies Initial HEP  PATIENT EDUCATION:  Education details: Educated patient on anatomy and physiology of current symptoms, prognosis, plan of care as well as initial self care strategies to promote recovery  Person educated: Patient Education method: Explanation Education comprehension: verbalized understanding  HOME EXERCISE PROGRAM: Access Code: [email or web address] URL: [email or web address] Date: 11/04/2023 Prepared by: Lavinia Sharps  Exercises - Isometric Ankle Inversion  - 1 x daily - 7 x weekly - 1 sets - 10 reps - 5 hold - Seated Arch Lifts  - 1 x daily - 7 x weekly - 1 sets - 10 reps - 5 hold - Seated Toe Towel Scrunches  - 1 x daily - 7 x weekly - 1 sets - 10 reps - Toe Yoga - Alternating Great Toe and Lesser Toe Extension  - 1 x daily - 7 x weekly - 1 sets - 10 reps - Standing Heel Raise with Support  - 1 x daily - 7 x weekly - 1 sets - 10 reps - 5 hold - Single Leg Stance with Support  - 1 x daily - 7 x weekly - 1 sets - 10 reps - 5 hold - Standing Gastroc Stretch at Counter  - 1 x daily - 7 x weekly - 3 sets - 10 reps - Clamshell  - 1 x daily - 7 x weekly - 1 sets - 10 reps - Sidelying Hip Abduction  - 1 x daily - 7 x weekly - 1 sets - 10 reps  ASSESSMENT:  CLINICAL IMPRESSION: Improving intrinsic foot muscle activation noted in seated and standing positions.  Moderate discomfort persists particularly with inversion.  Moderate swelling medial ankle at posterior tibialis tendon insertion. Therapist providing verbal cues to optimize technique with  exercises in order to achieve the greatest benefit.  Therapist also monitoring pain level with challenge level of exercise adjusted accordingly.      OBJECTIVE IMPAIRMENTS: decreased activity tolerance,  difficulty walking, decreased strength, increased edema, increased fascial restrictions, impaired perceived functional ability, postural dysfunction, and pain.   ACTIVITY LIMITATIONS: standing, stairs, and locomotion level  PARTICIPATION LIMITATIONS: meal prep, cleaning, laundry, shopping, and community activity  PERSONAL FACTORS: 1-2 comorbidities: previous history of plantar fascitis, no prior ex program  are also affecting patient's functional outcome.   REHAB POTENTIAL: Good  CLINICAL DECISION MAKING: Stable/uncomplicated  EVALUATION COMPLEXITY: Low   GOALS: Goals reviewed with patient? Yes  SHORT TERM GOALS: Target date: 12/02/2023   The patient will demonstrate knowledge of basic self care strategies and exercises to promote healing  Baseline: Goal status: MET 11/17/23  2.  The patient will report a 30% improvement in pain levels with functional activities which are currently difficult including standing and walking Baseline:  Goal status: In progress 11/17/23  3.  Improved postural alignment and strength to allow for standing and walking throughout the day with moderate difficulty rather than quite a bit of difficulty Baseline:  Goal status: In progress 11/17/23    LONG TERM GOALS: Target date: 12/30/2023   The patient will be independent in a safe self progression of a home exercise program to promote further recovery of function  Baseline:  Goal status: INITIAL  2.  The patient will report a 60% improvement in pain levels with functional activities which are currently difficult including standing and walking Baseline:  Goal status: INITIAL  3.  The patient will have improved LE strength needed to ascend and descend steps reciprocally  Baseline:  Goal status:  INITIAL  4.  Patient able to walk 1 mile with pain level <5/10 Baseline:  Goal status: INITIAL  5.  LEFS improved to  60/80 indicating improved function with less pain Baseline:  Goal status:  INITIAL    PLAN:  PT FREQUENCY: 2x/week  PT DURATION: 8 weeks  PLANNED INTERVENTIONS: 97164- PT Re-evaluation, 97110-Therapeutic exercises, 97530- Therapeutic activity, [id number]- Neuromuscular re-education, 97535- Self Care, [id number]- Manual therapy, [id number]- Aquatic Therapy, 97014- Electrical stimulation (unattended), [id number]- Electrical stimulation (manual), [id number]- Ultrasound, 62130- Ionotophoresis 4mg /ml Dexamethasone, Patient/Family education, Taping, Dry Needling, Joint mobilization, Cryotherapy, and Moist heat  PLAN FOR NEXT SESSION: ionto; pt considering DN posterior tib (limited amount);  Ankle/foot strengthening; knee and hip strength for improved alignment, continue education on supportive shoe with arch support; glute and quad strength  Lavinia Sharps, PT 11/22/23 5:19 PM Phone: [phone] Fax: [phone]

## 2023-11-25 ENCOUNTER — Ambulatory Visit: Payer: Medicare Other

## 2023-11-25 DIAGNOSIS — M6281 Muscle weakness (generalized): Secondary | ICD-10-CM | POA: Diagnosis not present

## 2023-11-25 DIAGNOSIS — R293 Abnormal posture: Secondary | ICD-10-CM

## 2023-11-25 DIAGNOSIS — R252 Cramp and spasm: Secondary | ICD-10-CM | POA: Diagnosis not present

## 2023-11-25 DIAGNOSIS — M25572 Pain in left ankle and joints of left foot: Secondary | ICD-10-CM | POA: Diagnosis not present

## 2023-11-25 DIAGNOSIS — R262 Difficulty in walking, not elsewhere classified: Secondary | ICD-10-CM | POA: Diagnosis not present

## 2023-11-25 NOTE — Therapy (Signed)
OUTPATIENT PHYSICAL THERAPY LOWER EXTREMITY  TREATMENT NOTE   Patient Name: Cynthia Berry MRN: [id number] DOB:09-24-1946, 78 y.o., female Today's Date: 11/25/2023  END OF SESSION:  PT End of Session - 11/25/23 1026     Visit Number 6    Date for PT Re-Evaluation 12/30/23    Authorization Type Medicare    Progress Note Due on Visit 10    PT Start Time 1018    PT Stop Time 1100    PT Time Calculation (min) 42 min    Activity Tolerance Patient tolerated treatment well    Behavior During Therapy WFL for tasks assessed/performed             Past Medical History:  Diagnosis Date   Anemia    Anxiety    Pt very anxious/sensitive to certain meds!   COVID 2022   Depression    Hx of adenomatous polyp of colon 04/30/2016   Hyperlipidemia    Hypertension    Post-operative nausea and vomiting    due to nerves   Past Surgical History:  Procedure Laterality Date   TONSILLECTOMY     as child   Patient Active Problem List   Diagnosis Date Noted   Osteopenia 03/27/2019   Morbid obesity (HCC) 04/19/2017   Hx of adenomatous polyp of colon 04/30/2016   Allergic rhinitis 12/28/2014   Gout 12/28/2014   Hyperglycemia 08/31/2013   Hyperlipidemia 09/30/2009   Depression 05/22/2007   Essential hypertension 05/22/2007    PCP: Shelva Majestic MD  REFERRING PROVIDER: Rodolph Bong MD  REFERRING DIAG: posterior tibialis dysfunction; left ankle pain M25,572   THERAPY DIAG:  Pain in left ankle and joints of left foot  Difficulty in walking, not elsewhere classified  Muscle weakness (generalized)  Cramp and spasm  Abnormal posture  Rationale for Evaluation and Treatment: Rehabilitation  ONSET DATE: Early November  SUBJECTIVE:   SUBJECTIVE STATEMENT: Patient reports still burning a little.  Discussed DN but she would like to wait until next week due to she will be watching her grand kids this weekend and doesn't want anything to irritate it any further.  I don't do stairs  b/c I have weak knees typically one at a time, not painful- had PT for LE strengthening and never could do stairs reciprocally, I didn't stick with it  I've joined [employer] for 2 years but I haven't gone yet  PERTINENT HISTORY: History of left plantar fascitis; HTN; osteopenia; depression; I had torn ligaments in college right leg PAIN:   Are you having pain? Yes NPRS scale: 0/10;  moderate now vs. Severe;  with standing/walking pain 6-7/10 Pain location: left medial ankle pain and swelling  Pain orientation: Left and Medial  PAIN TYPE: burning Pain description: burning  Aggravating factors: standing a long time; turn foot inward, walking a lot (a day long without resting);hobble at first after sitting Relieving factors: boot, ankle support  PRECAUTIONS: None   WEIGHT BEARING RESTRICTIONS: No  FALLS:  Has patient fallen in last 6 months? No  LIVING ENVIRONMENT: Has upstairs but bedroom downstairs  Leisure: travel, stay on the go; I don't like to walk though  OCCUPATION: retired Cabin crew ed Runner, broadcasting/film/video  PLOF: Independent  PATIENT GOALS: strengthen my legs; get around like I want to; be able to go on step without buckling  NEXT MD VISIT: no scheduled follow up  OBJECTIVE:  Note: Objective measures were completed at Evaluation unless otherwise noted.  DIAGNOSTIC FINDINGS: none  PATIENT SURVEYS:  LEFS 48/80  COGNITION: Overall cognitive status: Within functional limits for tasks assessed     EDEMA:  Medial left ankle moderate   POSTURE: moderate to severe pes planus bil Unable to single leg stand on left, in addition to medial ankle collapse left pelvic drop indicating gluteal weakness  PALPATION: Marked tenderness posterior tibialis  LOWER EXTREMITY ROM: ankle ROM WFLS although ankle inversion is painful; decreased gastroc muscle length  LOWER EXTREMITY MMT:   Ankle: DF 4/5; PF 4-/5, Inv 3+/5, eversion 4/5 Foot intrinsics 4-/5  GAIT: Comments: wearing shows  without arch support  FUNCTIONAL TESTS: 5X SIT TO STAND: 12.45 sed  TUG: 8.65 sec  TREATMENT DATE: 11/25/23 Nu-Step L3 10 min (while discussing expected course of recovery) Seated arch lifts (off pen) 10x 5 sec  Marble pick up x 20 marbles Seated soleus raise with 5# kettlebell on knee 2 x 10 Seated purple ball squeeze isometric 5 sec hold 10x  Half roll inversion/eversion x 20 with slight hold on each side Towel push inv/ev 2 x 10 each direction Toe scrunches 2 x 20 Seated small green ball between heels squeeze with heel raise 10x Ionto #2 with dexamethasone (6 hour wear time) to medial ankle in the area of the posterior tibial tendon, instruction on wearing time and any adverse effects  TREATMENT DATE: 11/22/23 Nu-Step L3 10 min (while discussing expected course of recovery) Seated arch lifts (off pen) 10x 5 sec  Seated soleus raise with 5# kettlebell on knee 10x Seated purple ball squeeze isometric 5 sec hold 10x  Seated small green ball between heels squeeze with heel raise 10x SLS 5 sec 5x Airex foam side to side weight shift Airex foam staggered stance front/back weight shift Therapeutic activity: standing, walking, bearing weight on affected extremity  TREATMENT DATE: 11/18/23 Recumbent bike x 7 min level 2  Seated rocker board DF/PF, then inv/ev x 2 min Towel push inv/ev 2 x 10 each direction Toe scrunches 2 x 20 Seated circular BAPS board x 2 min cw, then 2 min ccw Reviewed HEP and when to wear brace Ionto with dexamethasone (6 hour wear time) to medial ankle in the area of the posterior tibial tendon, instruction on wearing time and any adverse effects , to remove   TREATMENT DATE: 11/04/23  Discussed footwear: stability shoe with arch support Anti inflammatory strategies Initial HEP  PATIENT EDUCATION:  Education details: Educated patient on anatomy and physiology of current symptoms, prognosis, plan of care as well as initial self care strategies to promote  recovery  Person educated: Patient Education method: Explanation Education comprehension: verbalized understanding  HOME EXERCISE PROGRAM: Access Code: [email or web address] URL: [email or web address] Date: 11/04/2023 Prepared by: Lavinia Sharps  Exercises - Isometric Ankle Inversion  - 1 x daily - 7 x weekly - 1 sets - 10 reps - 5 hold - Seated Arch Lifts  - 1 x daily - 7 x weekly - 1 sets - 10 reps - 5 hold - Seated Toe Towel Scrunches  - 1 x daily - 7 x weekly - 1 sets - 10 reps - Toe Yoga - Alternating Great Toe and Lesser Toe Extension  - 1 x daily - 7 x weekly - 1 sets - 10 reps - Standing Heel Raise with Support  - 1 x daily - 7 x weekly - 1 sets - 10 reps - 5 hold - Single Leg Stance with Support  - 1 x daily - 7 x weekly - 1 sets - 10 reps - 5 hold - Standing  Gastroc Stretch at [employer]  - 1 x daily - 7 x weekly - 3 sets - 10 reps - Clamshell  - 1 x daily - 7 x weekly - 1 sets - 10 reps - Sidelying Hip Abduction  - 1 x daily - 7 x weekly - 1 sets - 10 reps  ASSESSMENT:  CLINICAL IMPRESSION: Cynthia Berry appears to be having mild chronic irritation but we found that she was not using any ice to control any of these symptoms.  We discussed going back in boot for short periods of time if she is going to be on her feet for a while.  This should allow the tendon to rest and pain to dissipate.  She is fairly compliant with her HEP.  She would benefit from using the ice regularly.  She should continue to improve and would benefit from skilled PT for ankle ROM, stability and symptom control.       OBJECTIVE IMPAIRMENTS: decreased activity tolerance, difficulty walking, decreased strength, increased edema, increased fascial restrictions, impaired perceived functional ability, postural dysfunction, and pain.   ACTIVITY LIMITATIONS: standing, stairs, and locomotion level  PARTICIPATION LIMITATIONS: meal prep, cleaning, laundry, shopping, and community activity  PERSONAL FACTORS: 1-2  comorbidities: previous history of plantar fascitis, no prior ex program  are also affecting patient's functional outcome.   REHAB POTENTIAL: Good  CLINICAL DECISION MAKING: Stable/uncomplicated  EVALUATION COMPLEXITY: Low   GOALS: Goals reviewed with patient? Yes  SHORT TERM GOALS: Target date: 12/02/2023   The patient will demonstrate knowledge of basic self care strategies and exercises to promote healing  Baseline: Goal status: MET 11/17/23  2.  The patient will report a 30% improvement in pain levels with functional activities which are currently difficult including standing and walking Baseline:  Goal status: In progress 11/17/23  3.  Improved postural alignment and strength to allow for standing and walking throughout the day with moderate difficulty rather than quite a bit of difficulty Baseline:  Goal status: In progress 11/17/23    LONG TERM GOALS: Target date: 12/30/2023   The patient will be independent in a safe self progression of a home exercise program to promote further recovery of function  Baseline:  Goal status: INITIAL  2.  The patient will report a 60% improvement in pain levels with functional activities which are currently difficult including standing and walking Baseline:  Goal status: INITIAL  3.  The patient will have improved LE strength needed to ascend and descend steps reciprocally  Baseline:  Goal status: INITIAL  4.  Patient able to walk 1 mile with pain level <5/10 Baseline:  Goal status: INITIAL  5.  LEFS improved to  60/80 indicating improved function with less pain Baseline:  Goal status: INITIAL    PLAN:  PT FREQUENCY: 2x/week  PT DURATION: 8 weeks  PLANNED INTERVENTIONS: 97164- PT Re-evaluation, 97110-Therapeutic exercises, 97530- Therapeutic activity, [id number]- Neuromuscular re-education, 97535- Self Care, 16109- Manual therapy, [id number]- Aquatic Therapy, 97014- Electrical stimulation (unattended), [id number]- Electrical stimulation  (manual), [id number]- Ultrasound, 60454- Ionotophoresis 4mg /ml Dexamethasone, Patient/Family education, Taping, Dry Needling, Joint mobilization, Cryotherapy, and Moist heat  PLAN FOR NEXT SESSION: ionto #3 if indicated;  DN posterior tib (limited amount);  Ankle/foot strengthening; knee and hip strength for improved alignment, continue education on supportive shoe with arch support; glute and quad strength  Terrian Sentell B. Zaraya Delauder, PT 11/25/23 11:18 AM [facility] [address], [address] [place], [place] [address] Phone # [phone] Fax [phone]

## 2023-11-29 ENCOUNTER — Encounter: Payer: Medicare Other | Admitting: Physical Therapy

## 2023-12-01 ENCOUNTER — Ambulatory Visit: Payer: Medicare Other | Admitting: Physical Therapy

## 2023-12-06 ENCOUNTER — Ambulatory Visit: Payer: Medicare Other | Admitting: Physical Therapy

## 2023-12-06 DIAGNOSIS — R262 Difficulty in walking, not elsewhere classified: Secondary | ICD-10-CM | POA: Diagnosis not present

## 2023-12-06 DIAGNOSIS — R293 Abnormal posture: Secondary | ICD-10-CM | POA: Diagnosis not present

## 2023-12-06 DIAGNOSIS — M6281 Muscle weakness (generalized): Secondary | ICD-10-CM | POA: Diagnosis not present

## 2023-12-06 DIAGNOSIS — R252 Cramp and spasm: Secondary | ICD-10-CM | POA: Diagnosis not present

## 2023-12-06 DIAGNOSIS — M25572 Pain in left ankle and joints of left foot: Secondary | ICD-10-CM | POA: Diagnosis not present

## 2023-12-06 NOTE — Therapy (Signed)
 OUTPATIENT PHYSICAL THERAPY LOWER EXTREMITY  TREATMENT NOTE   Patient Name: Cynthia Berry MRN: [id number] DOB:Oct 07, 1946, 78 y.o., female Today's Date: 12/06/2023  END OF SESSION:  PT End of Session - 12/06/23 1409     Visit Number 7    Date for PT Re-Evaluation 12/30/23    Authorization Type Medicare    Progress Note Due on Visit 10    PT Start Time 1409   late   PT Stop Time 1444    PT Time Calculation (min) 35 min    Activity Tolerance Patient tolerated treatment well             Past Medical History:  Diagnosis Date   Anemia    Anxiety    Pt very anxious/sensitive to certain meds!   COVID 2022   Depression    Hx of adenomatous polyp of colon 04/30/2016   Hyperlipidemia    Hypertension    Post-operative nausea and vomiting    due to nerves   Past Surgical History:  Procedure Laterality Date   TONSILLECTOMY     as child   Patient Active Problem List   Diagnosis Date Noted   Osteopenia 03/27/2019   Morbid obesity (HCC) 04/19/2017   Hx of adenomatous polyp of colon 04/30/2016   Allergic rhinitis 12/28/2014   Gout 12/28/2014   Hyperglycemia 08/31/2013   Hyperlipidemia 09/30/2009   Depression 05/22/2007   Essential hypertension 05/22/2007    PCP: Shelva Majestic MD  REFERRING PROVIDER: Rodolph Bong MD  REFERRING DIAG: posterior tibialis dysfunction; left ankle pain M25,572   THERAPY DIAG:  Pain in left ankle and joints of left foot  Difficulty in walking, not elsewhere classified  Muscle weakness (generalized)  Rationale for Evaluation and Treatment: Rehabilitation  ONSET DATE: Early November  SUBJECTIVE:   SUBJECTIVE STATEMENT: Patient reports she's been sick (respiratory illness) so I haven't been able to do the exercises.  My foot is better than usual.     I don't do stairs b/c I have weak knees typically one at a time, not painful- had PT for LE strengthening and never could do stairs reciprocally, I didn't stick with it  I've joined  [employer] for 2 years but I haven't gone yet  PERTINENT HISTORY: History of left plantar fascitis; HTN; osteopenia; depression; I had torn ligaments in college right leg PAIN:   Are you having pain? Yes NPRS scale: 0/10;  moderate now vs. Severe;  with standing/walking pain 6-7/10 Pain location: left medial ankle pain and swelling  Pain orientation: Left and Medial  PAIN TYPE: burning Pain description: burning  Aggravating factors: standing a long time; turn foot inward, walking a lot (a day long without resting);hobble at first after sitting Relieving factors: boot, ankle support  PRECAUTIONS: None   WEIGHT BEARING RESTRICTIONS: No  FALLS:  Has patient fallen in last 6 months? No  LIVING ENVIRONMENT: Has upstairs but bedroom downstairs  Leisure: travel, stay on the go; I don't like to walk though  OCCUPATION: retired Cabin crew ed Runner, broadcasting/film/video  PLOF: Independent  PATIENT GOALS: strengthen my legs; get around like I want to; be able to go on step without buckling  NEXT MD VISIT: no scheduled follow up  OBJECTIVE:  Note: Objective measures were completed at Evaluation unless otherwise noted.  DIAGNOSTIC FINDINGS: none  PATIENT SURVEYS:  LEFS 48/80  COGNITION: Overall cognitive status: Within functional limits for tasks assessed     EDEMA:  Medial left ankle moderate   POSTURE: moderate to severe  pes planus bil Unable to single leg stand on left, in addition to medial ankle collapse left pelvic drop indicating gluteal weakness  PALPATION: Marked tenderness posterior tibialis  LOWER EXTREMITY ROM: ankle ROM WFLS although ankle inversion is painful; decreased gastroc muscle length  LOWER EXTREMITY MMT:   Ankle: DF 4/5; PF 4-/5, Inv 3+/5, eversion 4/5 Foot intrinsics 4-/5  GAIT: Comments: wearing shows without arch support  FUNCTIONAL TESTS: 5X SIT TO STAND: 12.45 sed  TUG: 8.65 sec TREATMENT DATE: 12/06/23 Nu-Step L1 5 min (while discussing current  status) Seated soleus raise with 5# kettlebell on knee 2 x 10 Seated purple ball squeeze isometric 5 sec hold 10x  Seated small green ball between heels squeeze with heel raise 10x Seated green band ankle PF with green band 7x; PF with inversion 7x Standing on foam: 2 leg weight shifts SLS on floor  Standing short foot/lifts off pen (very challenging "burns") Y reaches with UE support 10x Towel scrunches seated  Seated with toes on 1/2 foam roll with 5# resting on knee heel lifts 2x10 Therapeutic activity: standing, walking, bearing weight on affected extremity Ionto #3 with dexamethasone (6 hour wear time) to medial ankle in the area of the posterior tibial tendon, instruction on wearing time and any adverse effects  TREATMENT DATE: 11/25/23 Nu-Step L3 10 min (while discussing expected course of recovery) Seated arch lifts (off pen) 10x 5 sec  Marble pick up x 20 marbles Seated soleus raise with 5# kettlebell on knee 2 x 10 Seated purple ball squeeze isometric 5 sec hold 10x  Half roll inversion/eversion x 20 with slight hold on each side Towel push inv/ev 2 x 10 each direction Toe scrunches 2 x 20 Seated small green ball between heels squeeze with heel raise 10x Ionto #2 with dexamethasone (6 hour wear time) to medial ankle in the area of the posterior tibial tendon, instruction on wearing time and any adverse effects  TREATMENT DATE: 11/22/23 Nu-Step L3 10 min (while discussing expected course of recovery) Seated arch lifts (off pen) 10x 5 sec  Seated soleus raise with 5# kettlebell on knee 10x Seated purple ball squeeze isometric 5 sec hold 10x  Seated small green ball between heels squeeze with heel raise 10x SLS 5 sec 5x Airex foam side to side weight shift Airex foam staggered stance front/back weight shift Therapeutic activity: standing, walking, bearing weight on affected extremity  TREATMENT DATE: 11/18/23 Recumbent bike x 7 min level 2  Seated rocker board DF/PF, then  inv/ev x 2 min Towel push inv/ev 2 x 10 each direction Toe scrunches 2 x 20 Seated circular BAPS board x 2 min cw, then 2 min ccw Reviewed HEP and when to wear brace Ionto with dexamethasone (6 hour wear time) to medial ankle in the area of the posterior tibial tendon, instruction on wearing time and any adverse effects , to remove   TREATMENT DATE: 11/04/23  Discussed footwear: stability shoe with arch support Anti inflammatory strategies Initial HEP  PATIENT EDUCATION:  Education details: Educated patient on anatomy and physiology of current symptoms, prognosis, plan of care as well as initial self care strategies to promote recovery  Person educated: Patient Education method: Explanation Education comprehension: verbalized understanding  HOME EXERCISE PROGRAM: Access Code: [email or web address] URL: [email or web address] Date: 11/04/2023 Prepared by: Lavinia Sharps  Exercises - Isometric Ankle Inversion  - 1 x daily - 7 x weekly - 1 sets - 10 reps - 5 hold - Seated Arch Lifts  - 1 x  daily - 7 x weekly - 1 sets - 10 reps - 5 hold - Seated Toe Towel Scrunches  - 1 x daily - 7 x weekly - 1 sets - 10 reps - Toe Yoga - Alternating Great Toe and Lesser Toe Extension  - 1 x daily - 7 x weekly - 1 sets - 10 reps - Standing Heel Raise with Support  - 1 x daily - 7 x weekly - 1 sets - 10 reps - 5 hold - Single Leg Stance with Support  - 1 x daily - 7 x weekly - 1 sets - 10 reps - 5 hold - Standing Gastroc Stretch at Counter  - 1 x daily - 7 x weekly - 3 sets - 10 reps - Clamshell  - 1 x daily - 7 x weekly - 1 sets - 10 reps - Sidelying Hip Abduction  - 1 x daily - 7 x weekly - 1 sets - 10 reps  ASSESSMENT:  CLINICAL IMPRESSION: [place] after being sick for the last week.  She is able to restart ankle and foot strengthening and proprioceptive ex's.  Standing "short foot" is very challenging.  Therapist closely monitoring response and modifying treatment based on response.  She is  responding well to ionto so will continue with series.    OBJECTIVE IMPAIRMENTS: decreased activity tolerance, difficulty walking, decreased strength, increased edema, increased fascial restrictions, impaired perceived functional ability, postural dysfunction, and pain.   ACTIVITY LIMITATIONS: standing, stairs, and locomotion level  PARTICIPATION LIMITATIONS: meal prep, cleaning, laundry, shopping, and community activity  PERSONAL FACTORS: 1-2 comorbidities: previous history of plantar fascitis, no prior ex program  are also affecting patient's functional outcome.   REHAB POTENTIAL: Good  CLINICAL DECISION MAKING: Stable/uncomplicated  EVALUATION COMPLEXITY: Low   GOALS: Goals reviewed with patient? Yes  SHORT TERM GOALS: Target date: 12/02/2023   The patient will demonstrate knowledge of basic self care strategies and exercises to promote healing  Baseline: Goal status: MET 11/17/23  2.  The patient will report a 30% improvement in pain levels with functional activities which are currently difficult including standing and walking Baseline:  Goal status: In progress 11/17/23  3.  Improved postural alignment and strength to allow for standing and walking throughout the day with moderate difficulty rather than quite a bit of difficulty Baseline:  Goal status: In progress 11/17/23    LONG TERM GOALS: Target date: 12/30/2023   The patient will be independent in a safe self progression of a home exercise program to promote further recovery of function  Baseline:  Goal status: INITIAL  2.  The patient will report a 60% improvement in pain levels with functional activities which are currently difficult including standing and walking Baseline:  Goal status: INITIAL  3.  The patient will have improved LE strength needed to ascend and descend steps reciprocally  Baseline:  Goal status: INITIAL  4.  Patient able to walk 1 mile with pain level <5/10 Baseline:  Goal status:  INITIAL  5.  LEFS improved to  60/80 indicating improved function with less pain Baseline:  Goal status: INITIAL    PLAN:  PT FREQUENCY: 2x/week  PT DURATION: 8 weeks  PLANNED INTERVENTIONS: 97164- PT Re-evaluation, 97110-Therapeutic exercises, 97530- Therapeutic activity, [id number]- Neuromuscular re-education, 97535- Self Care, [id number]- Manual therapy, [id number]- Aquatic Therapy, 97014- Electrical stimulation (unattended), [id number]- Electrical stimulation (manual), [id number]- Ultrasound, 19147- Ionotophoresis 4mg /ml Dexamethasone, Patient/Family education, Taping, Dry Needling, Joint mobilization, Cryotherapy, and Moist heat  PLAN FOR NEXT SESSION:  ionto #4;   DN posterior tib (limited amount);  Ankle/foot strengthening; knee and hip strength for improved alignment, continue education on supportive shoe with arch support; glute and quad strength  Lavinia Sharps, PT 12/06/23 3:24 PM Phone: [phone] Fax: [phone]

## 2023-12-08 ENCOUNTER — Ambulatory Visit: Payer: Medicare Other | Admitting: Physical Therapy

## 2023-12-08 DIAGNOSIS — R293 Abnormal posture: Secondary | ICD-10-CM | POA: Diagnosis not present

## 2023-12-08 DIAGNOSIS — M25572 Pain in left ankle and joints of left foot: Secondary | ICD-10-CM

## 2023-12-08 DIAGNOSIS — R252 Cramp and spasm: Secondary | ICD-10-CM | POA: Diagnosis not present

## 2023-12-08 DIAGNOSIS — M6281 Muscle weakness (generalized): Secondary | ICD-10-CM

## 2023-12-08 DIAGNOSIS — R262 Difficulty in walking, not elsewhere classified: Secondary | ICD-10-CM | POA: Diagnosis not present

## 2023-12-08 NOTE — Therapy (Signed)
 OUTPATIENT PHYSICAL THERAPY LOWER EXTREMITY  TREATMENT NOTE   Patient Name: Cynthia Berry MRN: [id number] DOB:11-17-1945, 78 y.o., female Today's Date: 12/08/2023  END OF SESSION:  PT End of Session - 12/08/23 1401     Visit Number 8    Date for PT Re-Evaluation 12/30/23    Authorization Type Medicare    Progress Note Due on Visit 10    PT Start Time 1400    PT Stop Time 1440    PT Time Calculation (min) 40 min    Activity Tolerance Patient tolerated treatment well             Past Medical History:  Diagnosis Date   Anemia    Anxiety    Pt very anxious/sensitive to certain meds!   COVID 2022   Depression    Hx of adenomatous polyp of colon 04/30/2016   Hyperlipidemia    Hypertension    Post-operative nausea and vomiting    due to nerves   Past Surgical History:  Procedure Laterality Date   TONSILLECTOMY     as child   Patient Active Problem List   Diagnosis Date Noted   Osteopenia 03/27/2019   Morbid obesity (HCC) 04/19/2017   Hx of adenomatous polyp of colon 04/30/2016   Allergic rhinitis 12/28/2014   Gout 12/28/2014   Hyperglycemia 08/31/2013   Hyperlipidemia 09/30/2009   Depression 05/22/2007   Essential hypertension 05/22/2007    PCP: Shelva Majestic MD  REFERRING PROVIDER: Rodolph Bong MD  REFERRING DIAG: posterior tibialis dysfunction; left ankle pain M25,572   THERAPY DIAG:  Pain in left ankle and joints of left foot  Difficulty in walking, not elsewhere classified  Muscle weakness (generalized)  Rationale for Evaluation and Treatment: Rehabilitation  ONSET DATE: Early November  SUBJECTIVE:   SUBJECTIVE STATEMENT: Denies soreness after last session.  Wearing New Balances.    I don't do stairs b/c I have weak knees typically one at a time, not painful- had PT for LE strengthening and never could do stairs reciprocally, I didn't stick with it  I've joined [employer] for 2 years but I haven't gone yet  PERTINENT HISTORY: History of  left plantar fascitis; HTN; osteopenia; depression; I had torn ligaments in college right leg PAIN:   Are you having pain? Yes NPRS scale: 0/10;  moderate now vs. Severe;  with standing/walking pain 6-7/10 Pain location: left medial ankle pain and swelling  Pain orientation: Left and Medial  PAIN TYPE: burning Pain description: burning  Aggravating factors: standing a long time; turn foot inward, walking a lot (a day long without resting);hobble at first after sitting Relieving factors: boot, ankle support  PRECAUTIONS: None   WEIGHT BEARING RESTRICTIONS: No  FALLS:  Has patient fallen in last 6 months? No  LIVING ENVIRONMENT: Has upstairs but bedroom downstairs  Leisure: travel, stay on the go; I don't like to walk though  OCCUPATION: retired Cabin crew ed Runner, broadcasting/film/video  PLOF: Independent  PATIENT GOALS: strengthen my legs; get around like I want to; be able to go on step without buckling  NEXT MD VISIT: no scheduled follow up  OBJECTIVE:  Note: Objective measures were completed at Evaluation unless otherwise noted.  DIAGNOSTIC FINDINGS: none  PATIENT SURVEYS:  LEFS 48/80  COGNITION: Overall cognitive status: Within functional limits for tasks assessed     EDEMA:  Medial left ankle moderate   POSTURE: moderate to severe pes planus bil Unable to single leg stand on left, in addition to medial ankle collapse left  pelvic drop indicating gluteal weakness  PALPATION: Marked tenderness posterior tibialis  LOWER EXTREMITY ROM: ankle ROM WFLS although ankle inversion is painful; decreased gastroc muscle length  LOWER EXTREMITY MMT:   Ankle: DF 4/5; PF 4-/5, Inv 3+/5, eversion 4/5 Foot intrinsics 4-/5  GAIT: Comments: wearing shows without arch support  FUNCTIONAL TESTS: 5X SIT TO STAND: 12.45 sed  TUG: 8.65 sec  TREATMENT DATE: 12/08/23 Nu-Step L3  5 min (while discussing current status) Rocker board on wall calf stretch 30 sec holds 5x Standing isometric heel  raise with side to side weight shift 10x Seated isometric inversion with yellow band 5 sec hold 10x Seated small green ball between heels squeeze with heel raise 10x; 2nd set with 7# resting on knee 10x Single leg [place] dead lift to reach knee level stool 2 sets of 8 (2 finger support) WB on left with 8 inch step tap 5 sec holds (next to railing ) Seated green band plantarflexion/inversion 2x10 Forward and back stepping with right leg (WB on left) 10x Lateral step over target with right leg (WB on left) 10x 20 inch box sit to stands 10x (cue to press heels into the floor) Therapeutic activity: standing, walking, bearing weight on affected extremity Ionto #4 with dexamethasone (6 hour wear time) to medial ankle in the area of the posterior tibial tendon, instruction on wearing time and any adverse effects TREATMENT DATE: 12/06/23 Nu-Step L1 5 min (while discussing current status) Seated soleus raise with 5# kettlebell on knee 2 x 10 Seated purple ball squeeze isometric 5 sec hold 10x  Seated small green ball between heels squeeze with heel raise 10x Seated green band ankle PF with green band 7x; PF with inversion 7x Standing on foam: 2 leg weight shifts SLS on floor  Standing short foot/lifts off pen (very challenging "burns") Y reaches with UE support 10x Towel scrunches seated  Seated with toes on 1/2 foam roll with 5# resting on knee heel lifts 2x10 Therapeutic activity: standing, walking, bearing weight on affected extremity Ionto #3 with dexamethasone (6 hour wear time) to medial ankle in the area of the posterior tibial tendon, instruction on wearing time and any adverse effects  TREATMENT DATE: 11/25/23 Nu-Step L3 10 min (while discussing expected course of recovery) Seated arch lifts (off pen) 10x 5 sec  Marble pick up x 20 marbles Seated soleus raise with 5# kettlebell on knee 2 x 10 Seated purple ball squeeze isometric 5 sec hold 10x  Half roll inversion/eversion x 20 with  slight hold on each side Towel push inv/ev 2 x 10 each direction Toe scrunches 2 x 20 Seated small green ball between heels squeeze with heel raise 10x Ionto #2 with dexamethasone (6 hour wear time) to medial ankle in the area of the posterior tibial tendon, instruction on wearing time and any adverse effects  TREATMENT DATE: 11/22/23 Nu-Step L3 10 min (while discussing expected course of recovery) Seated arch lifts (off pen) 10x 5 sec  Seated soleus raise with 5# kettlebell on knee 10x Seated purple ball squeeze isometric 5 sec hold 10x  Seated small green ball between heels squeeze with heel raise 10x SLS 5 sec 5x Airex foam side to side weight shift Airex foam staggered stance front/back weight shift Therapeutic activity: standing, walking, bearing weight on affected extremity  TREATMENT DATE: 11/18/23 Recumbent bike x 7 min level 2  Seated rocker board DF/PF, then inv/ev x 2 min Towel push inv/ev 2 x 10 each direction Toe scrunches 2  x 20 Seated circular BAPS board x 2 min cw, then 2 min ccw Reviewed HEP and when to wear brace Ionto with dexamethasone (6 hour wear time) to medial ankle in the area of the posterior tibial tendon, instruction on wearing time and any adverse effects , to remove   TREATMENT DATE: 11/04/23  Discussed footwear: stability shoe with arch support Anti inflammatory strategies Initial HEP  PATIENT EDUCATION:  Education details: Educated patient on anatomy and physiology of current symptoms, prognosis, plan of care as well as initial self care strategies to promote recovery  Person educated: Patient Education method: Explanation Education comprehension: verbalized understanding  HOME EXERCISE PROGRAM: Access Code: [email or web address] URL: [email or web address] Date: 11/04/2023 Prepared by: Lavinia Sharps  Exercises - Isometric Ankle Inversion  - 1 x daily - 7 x weekly - 1 sets - 10 reps - 5 hold - Seated Arch Lifts  - 1 x daily - 7 x weekly - 1  sets - 10 reps - 5 hold - Seated Toe Towel Scrunches  - 1 x daily - 7 x weekly - 1 sets - 10 reps - Toe Yoga - Alternating Great Toe and Lesser Toe Extension  - 1 x daily - 7 x weekly - 1 sets - 10 reps - Standing Heel Raise with Support  - 1 x daily - 7 x weekly - 1 sets - 10 reps - 5 hold - Single Leg Stance with Support  - 1 x daily - 7 x weekly - 1 sets - 10 reps - 5 hold - Standing Gastroc Stretch at Counter  - 1 x daily - 7 x weekly - 3 sets - 10 reps - Clamshell  - 1 x daily - 7 x weekly - 1 sets - 10 reps - Sidelying Hip Abduction  - 1 x daily - 7 x weekly - 1 sets - 10 reps  ASSESSMENT:  CLINICAL IMPRESSION: Able to increase left LE weight bearing/load for multi directional movements.  No increase in pain although she feels reports her muscles feel challenged.  Some UE support needed for most standing ex's.  Glute and quad weakness also affecting foot alignment and tendency for overpronation.    OBJECTIVE IMPAIRMENTS: decreased activity tolerance, difficulty walking, decreased strength, increased edema, increased fascial restrictions, impaired perceived functional ability, postural dysfunction, and pain.   ACTIVITY LIMITATIONS: standing, stairs, and locomotion level  PARTICIPATION LIMITATIONS: meal prep, cleaning, laundry, shopping, and community activity  PERSONAL FACTORS: 1-2 comorbidities: previous history of plantar fascitis, no prior ex program  are also affecting patient's functional outcome.   REHAB POTENTIAL: Good  CLINICAL DECISION MAKING: Stable/uncomplicated  EVALUATION COMPLEXITY: Low   GOALS: Goals reviewed with patient? Yes  SHORT TERM GOALS: Target date: 12/02/2023   The patient will demonstrate knowledge of basic self care strategies and exercises to promote healing  Baseline: Goal status: MET 11/17/23  2.  The patient will report a 30% improvement in pain levels with functional activities which are currently difficult including standing and  walking Baseline:  Goal status: In progress 11/17/23  3.  Improved postural alignment and strength to allow for standing and walking throughout the day with moderate difficulty rather than quite a bit of difficulty Baseline:  Goal status: met 2/27    LONG TERM GOALS: Target date: 12/30/2023   The patient will be independent in a safe self progression of a home exercise program to promote further recovery of function  Baseline:  Goal status: INITIAL  2.  The patient will report  a 60% improvement in pain levels with functional activities which are currently difficult including standing and walking Baseline:  Goal status: INITIAL  3.  The patient will have improved LE strength needed to ascend and descend steps reciprocally  Baseline:  Goal status: INITIAL  4.  Patient able to walk 1 mile with pain level <5/10 Baseline:  Goal status: INITIAL  5.  LEFS improved to  60/80 indicating improved function with less pain Baseline:  Goal status: INITIAL    PLAN:  PT FREQUENCY: 2x/week  PT DURATION: 8 weeks  PLANNED INTERVENTIONS: 97164- PT Re-evaluation, 97110-Therapeutic exercises, 97530- Therapeutic activity, 97112- Neuromuscular re-education, 97535- Self Care, 16109- Manual therapy, [id number]- Aquatic Therapy, 97014- Electrical stimulation (unattended), [id number]- Electrical stimulation (manual), [id number]- Ultrasound, 60454- Ionotophoresis 4mg /ml Dexamethasone, Patient/Family education, Taping, Dry Needling, Joint mobilization, Cryotherapy, and Moist heat  PLAN FOR NEXT SESSION:ask % improvement for STG; ionto #5;   DN posterior tib (limited amount);  Ankle/foot strengthening; knee and hip strength for improved alignment, continue education on supportive shoe with arch support; glute and quad strength  Lavinia Sharps, PT 12/08/23 2:52 PM Phone: [phone] Fax: [phone]

## 2023-12-13 ENCOUNTER — Ambulatory Visit: Payer: Medicare Other | Attending: Sports Medicine | Admitting: Physical Therapy

## 2023-12-13 DIAGNOSIS — R252 Cramp and spasm: Secondary | ICD-10-CM | POA: Insufficient documentation

## 2023-12-13 DIAGNOSIS — R262 Difficulty in walking, not elsewhere classified: Secondary | ICD-10-CM | POA: Insufficient documentation

## 2023-12-13 DIAGNOSIS — M25572 Pain in left ankle and joints of left foot: Secondary | ICD-10-CM | POA: Insufficient documentation

## 2023-12-13 DIAGNOSIS — M6281 Muscle weakness (generalized): Secondary | ICD-10-CM | POA: Insufficient documentation

## 2023-12-13 NOTE — Therapy (Signed)
 OUTPATIENT PHYSICAL THERAPY LOWER EXTREMITY  TREATMENT NOTE   Patient Name: Cynthia Berry MRN: [id number] DOB:29-May-1946, 78 y.o., female Today's Date: 12/13/2023  END OF SESSION:  PT End of Session - 12/13/23 1109     Visit Number 9    Date for PT Re-Evaluation 12/30/23    Authorization Type Medicare    PT Start Time 1102    PT Stop Time 1144    PT Time Calculation (min) 42 min    Activity Tolerance Patient tolerated treatment well             Past Medical History:  Diagnosis Date   Anemia    Anxiety    Pt very anxious/sensitive to certain meds!   COVID 2022   Depression    Hx of adenomatous polyp of colon 04/30/2016   Hyperlipidemia    Hypertension    Post-operative nausea and vomiting    due to nerves   Past Surgical History:  Procedure Laterality Date   TONSILLECTOMY     as child   Patient Active Problem List   Diagnosis Date Noted   Osteopenia 03/27/2019   Morbid obesity (HCC) 04/19/2017   Hx of adenomatous polyp of colon 04/30/2016   Allergic rhinitis 12/28/2014   Gout 12/28/2014   Hyperglycemia 08/31/2013   Hyperlipidemia 09/30/2009   Depression 05/22/2007   Essential hypertension 05/22/2007    PCP: Shelva Majestic MD  REFERRING PROVIDER: Rodolph Bong MD  REFERRING DIAG: posterior tibialis dysfunction; left ankle pain M25,572   THERAPY DIAG:  Pain in left ankle and joints of left foot  Difficulty in walking, not elsewhere classified  Muscle weakness (generalized)  Cramp and spasm  Rationale for Evaluation and Treatment: Rehabilitation  ONSET DATE: Early November  SUBJECTIVE:   SUBJECTIVE STATEMENT: Variable days, some days bothers me and other days there is some improvement.  Unable to walk a mile yet   I don't do stairs b/c I have weak knees typically one at a time, not painful- had PT for LE strengthening and never could do stairs reciprocally, I didn't stick with it  I've joined [employer] for 2 years but I haven't gone  yet  PERTINENT HISTORY: History of left plantar fascitis; HTN; osteopenia; depression; I had torn ligaments in college right leg PAIN:   Are you having pain? Yes NPRS scale: 0/10 up and down pain levels depending on the day Pain location: left medial ankle pain and swelling  Pain orientation: Left and Medial  PAIN TYPE: burning Pain description: burning  Aggravating factors: standing a long time; turn foot inward, walking a lot (a day long without resting);hobble at first after sitting Relieving factors: boot, ankle support  PRECAUTIONS: None   WEIGHT BEARING RESTRICTIONS: No  FALLS:  Has patient fallen in last 6 months? No  LIVING ENVIRONMENT: Has upstairs but bedroom downstairs  Leisure: travel, stay on the go; I don't like to walk though  OCCUPATION: retired Cabin crew ed Runner, broadcasting/film/video  PLOF: Independent  PATIENT GOALS: strengthen my legs; get around like I want to; be able to go on step without buckling  NEXT MD VISIT: no scheduled follow up  OBJECTIVE:  Note: Objective measures were completed at Evaluation unless otherwise noted.  DIAGNOSTIC FINDINGS: none  PATIENT SURVEYS:  LEFS 48/80  COGNITION: Overall cognitive status: Within functional limits for tasks assessed     EDEMA:  Medial left ankle moderate   POSTURE: moderate to severe pes planus bil Unable to single leg stand on left, in addition to  medial ankle collapse left pelvic drop indicating gluteal weakness  PALPATION: Marked tenderness posterior tibialis  LOWER EXTREMITY ROM: ankle ROM WFLS although ankle inversion is painful; decreased gastroc muscle length  LOWER EXTREMITY MMT:   Ankle: DF 4/5; PF 4-/5, Inv 3+/5, eversion 4/5 Foot intrinsics 4-/5  GAIT: Comments: wearing shows without arch support  FUNCTIONAL TESTS: 5X SIT TO STAND: 12.45 sed  TUG: 8.65 sec TREATMENT DATE: 12/13/23 Nu-Step L3  5 min (while discussing current status) Discussed walking program 5-10 min initially Discussion  on expected course of recovery Plan of care discussion (decreased treatment frequency at recert) Calf press seat 6 bilateral 30# 12x (some difficulty keeping feet on the platform, try more weight next time) Standing isometric heel raise with side step the length of the railing 3x Seated isometric inversion with yellow band 5 sec hold 10x Standing small green ball between heels squeeze with heel raise 5x 5 sec hold 20 inch box sit to stands 12x (cue to press heels into the floor) Therapeutic activity: standing, walking, bearing weight on affected extremity Ionto #5 with dexamethasone (6 hour wear time) to medial ankle in the area of the posterior tibial tendon, instruction on wearing time and any adverse effects TREATMENT DATE: 12/08/23 Nu-Step L3  5 min (while discussing current status) Rocker board on wall calf stretch 30 sec holds 5x Standing isometric heel raise with side to side weight shift 10x Seated isometric inversion with yellow band 5 sec hold 10x Seated small green ball between heels squeeze with heel raise 10x; 2nd set with 7# resting on knee 10x Single leg [place] dead lift to reach knee level stool 2 sets of 8 (2 finger support) WB on left with 8 inch step tap 5 sec holds (next to railing ) Seated green band plantarflexion/inversion 2x10 Forward and back stepping with right leg (WB on left) 10x Lateral step over target with right leg (WB on left) 10x 20 inch box sit to stands 10x (cue to press heels into the floor) Therapeutic activity: standing, walking, bearing weight on affected extremity Ionto #4 with dexamethasone (6 hour wear time) to medial ankle in the area of the posterior tibial tendon, instruction on wearing time and any adverse effects TREATMENT DATE: 12/06/23 Nu-Step L1 5 min (while discussing current status) Seated soleus raise with 5# kettlebell on knee 2 x 10 Seated purple ball squeeze isometric 5 sec hold 10x  Seated small green ball between heels squeeze with  heel raise 10x Seated green band ankle PF with green band 7x; PF with inversion 7x Standing on foam: 2 leg weight shifts SLS on floor  Standing short foot/lifts off pen (very challenging "burns") Y reaches with UE support 10x Towel scrunches seated  Seated with toes on 1/2 foam roll with 5# resting on knee heel lifts 2x10 Therapeutic activity: standing, walking, bearing weight on affected extremity Ionto #3 with dexamethasone (6 hour wear time) to medial ankle in the area of the posterior tibial tendon, instruction on wearing time and any adverse effects  TREATMENT DATE: 11/25/23 Nu-Step L3 10 min (while discussing expected course of recovery) Seated arch lifts (off pen) 10x 5 sec  Marble pick up x 20 marbles Seated soleus raise with 5# kettlebell on knee 2 x 10 Seated purple ball squeeze isometric 5 sec hold 10x  Half roll inversion/eversion x 20 with slight hold on each side Towel push inv/ev 2 x 10 each direction Toe scrunches 2 x 20 Seated small green ball between heels squeeze with  heel raise 10x Ionto #2 with dexamethasone (6 hour wear time) to medial ankle in the area of the posterior tibial tendon, instruction on wearing time and any adverse effects PATIENT EDUCATION:  Education details: Educated patient on anatomy and physiology of current symptoms, prognosis, plan of care as well as initial self care strategies to promote recovery  Person educated: Patient Education method: Explanation Education comprehension: verbalized understanding  HOME EXERCISE PROGRAM: Access Code: [email or web address] URL: [email or web address] Date: 11/04/2023 Prepared by: Lavinia Sharps  Exercises - Isometric Ankle Inversion  - 1 x daily - 7 x weekly - 1 sets - 10 reps - 5 hold - Seated Arch Lifts  - 1 x daily - 7 x weekly - 1 sets - 10 reps - 5 hold - Seated Toe Towel Scrunches  - 1 x daily - 7 x weekly - 1 sets - 10 reps - Toe Yoga - Alternating Great Toe and Lesser Toe Extension  - 1 x  daily - 7 x weekly - 1 sets - 10 reps - Standing Heel Raise with Support  - 1 x daily - 7 x weekly - 1 sets - 10 reps - 5 hold - Single Leg Stance with Support  - 1 x daily - 7 x weekly - 1 sets - 10 reps - 5 hold - Standing Gastroc Stretch at Counter  - 1 x daily - 7 x weekly - 3 sets - 10 reps - Clamshell  - 1 x daily - 7 x weekly - 1 sets - 10 reps - Sidelying Hip Abduction  - 1 x daily - 7 x weekly - 1 sets - 10 reps  ASSESSMENT:  CLINICAL IMPRESSION: Discussed slower course of recovery expected based on affected soft tissue healing times and chronicity of the problem.  She is able to increase number of reps and perform more standing ex's vs. Seated today.  She reports some mild burning but symptoms subside with rest.   OBJECTIVE IMPAIRMENTS: decreased activity tolerance, difficulty walking, decreased strength, increased edema, increased fascial restrictions, impaired perceived functional ability, postural dysfunction, and pain.   ACTIVITY LIMITATIONS: standing, stairs, and locomotion level  PARTICIPATION LIMITATIONS: meal prep, cleaning, laundry, shopping, and community activity  PERSONAL FACTORS: 1-2 comorbidities: previous history of plantar fascitis, no prior ex program  are also affecting patient's functional outcome.   REHAB POTENTIAL: Good  CLINICAL DECISION MAKING: Stable/uncomplicated  EVALUATION COMPLEXITY: Low   GOALS: Goals reviewed with patient? Yes  SHORT TERM GOALS: Target date: 12/02/2023   The patient will demonstrate knowledge of basic self care strategies and exercises to promote healing  Baseline: Goal status: MET 11/17/23  2.  The patient will report a 30% improvement in pain levels with functional activities which are currently difficult including standing and walking Baseline:  Goal status: In progress 11/17/23  3.  Improved postural alignment and strength to allow for standing and walking throughout the day with moderate difficulty rather than quite a  bit of difficulty Baseline:  Goal status: met 2/27    LONG TERM GOALS: Target date: 12/30/2023   The patient will be independent in a safe self progression of a home exercise program to promote further recovery of function  Baseline:  Goal status: INITIAL  2.  The patient will report a 60% improvement in pain levels with functional activities which are currently difficult including standing and walking Baseline:  Goal status: INITIAL  3.  The patient will have improved LE strength needed to ascend and descend steps reciprocally  Baseline:  Goal status: INITIAL  4.  Patient able to walk 1 mile with pain level <5/10 Baseline:  Goal status: INITIAL  5.  LEFS improved to  60/80 indicating improved function with less pain Baseline:  Goal status: INITIAL    PLAN:  PT FREQUENCY: 2x/week  PT DURATION: 8 weeks  PLANNED INTERVENTIONS: 97164- PT Re-evaluation, 97110-Therapeutic exercises, 97530- Therapeutic activity, [id number]- Neuromuscular re-education, 97535- Self Care, 81191- Manual therapy, [id number]- Aquatic Therapy, 97014- Electrical stimulation (unattended), [id number]- Electrical stimulation (manual), [id number]- Ultrasound, 47829- Ionotophoresis 4mg /ml Dexamethasone, Patient/Family education, Taping, Dry Needling, Joint mobilization, Cryotherapy, and Moist heat  PLAN FOR NEXT SESSION: ionto #6;   DN posterior tib (limited amount);  Ankle/foot strengthening; knee and hip strength for improved alignment, continue education on supportive shoe with arch support; glute and quad strength  Lavinia Sharps, PT 12/13/23 5:35 PM Phone: [phone] Fax: [phone]

## 2023-12-15 ENCOUNTER — Ambulatory Visit: Payer: Medicare Other | Admitting: Physical Therapy

## 2023-12-15 DIAGNOSIS — M6281 Muscle weakness (generalized): Secondary | ICD-10-CM | POA: Diagnosis not present

## 2023-12-15 DIAGNOSIS — R262 Difficulty in walking, not elsewhere classified: Secondary | ICD-10-CM

## 2023-12-15 DIAGNOSIS — M25572 Pain in left ankle and joints of left foot: Secondary | ICD-10-CM | POA: Diagnosis not present

## 2023-12-15 DIAGNOSIS — R252 Cramp and spasm: Secondary | ICD-10-CM | POA: Diagnosis not present

## 2023-12-15 NOTE — Therapy (Signed)
 OUTPATIENT PHYSICAL THERAPY LOWER EXTREMITY  TREATMENT NOTE   Patient Name: Cynthia Berry MRN: [id number] DOB:[birth year], 78 y.o., female Today's Date: 12/15/2023   Progress Note Reporting Period 11/04/23 to 12/15/23  See note below for Objective Data and Assessment of Progress/Goals.     END OF SESSION:  PT End of Session - 12/15/23 1356     Visit Number 10    Date for PT Re-Evaluation 12/30/23    Authorization Type Medicare    Progress Note Due on Visit 20    PT Start Time 1400    PT Stop Time 1440    PT Time Calculation (min) 40 min    Activity Tolerance Patient tolerated treatment well             Past Medical History:  Diagnosis Date   Anemia    Anxiety    Pt very anxious/sensitive to certain meds!   COVID 2022   Depression    Hx of adenomatous polyp of colon 04/30/2016   Hyperlipidemia    Hypertension    Post-operative nausea and vomiting    due to nerves   Past Surgical History:  Procedure Laterality Date   TONSILLECTOMY     as child   Patient Active Problem List   Diagnosis Date Noted   Osteopenia 03/27/2019   Morbid obesity (HCC) 04/19/2017   Hx of adenomatous polyp of colon 04/30/2016   Allergic rhinitis 12/28/2014   Gout 12/28/2014   Hyperglycemia 08/31/2013   Hyperlipidemia 09/30/2009   Depression 05/22/2007   Essential hypertension 05/22/2007    PCP: Shelva Majestic MD  REFERRING PROVIDER: Rodolph Bong MD  REFERRING DIAG: posterior tibialis dysfunction; left ankle pain M25,572   THERAPY DIAG:  Pain in left ankle and joints of left foot  Difficulty in walking, not elsewhere classified  Muscle weakness (generalized)  Rationale for Evaluation and Treatment: Rehabilitation  ONSET DATE: Early November  SUBJECTIVE:   SUBJECTIVE STATEMENT: It's feeling pretty good.  That patch feels so good after it's on.  I thought I was going to pay for it (calf press) last time but I didn't.     I don't do stairs b/c I have weak knees typically  one at a time, not painful- had PT for LE strengthening and never could do stairs reciprocally, I didn't stick with it  I've joined [employer] for 2 years but I haven't gone yet  PERTINENT HISTORY: History of left plantar fascitis; HTN; osteopenia; depression; I had torn ligaments in college right leg PAIN:   Are you having pain? Yes NPRS scale: 0/10 up and down pain levels depending on the day Pain location: left medial ankle pain and swelling  Pain orientation: Left and Medial  PAIN TYPE: burning Pain description: burning  Aggravating factors: standing a long time; turn foot inward, walking a lot (a day long without resting);hobble at first after sitting Relieving factors: boot, ankle support  PRECAUTIONS: None   WEIGHT BEARING RESTRICTIONS: No  FALLS:  Has patient fallen in last 6 months? No  LIVING ENVIRONMENT: Has upstairs but bedroom downstairs  Leisure: travel, stay on the go; I don't like to walk though  OCCUPATION: retired Cabin crew ed Runner, broadcasting/film/video  PLOF: Independent  PATIENT GOALS: strengthen my legs; get around like I want to; be able to go on step without buckling  NEXT MD VISIT: no scheduled follow up  OBJECTIVE:  Note: Objective measures were completed at Evaluation unless otherwise noted.  DIAGNOSTIC FINDINGS: none  PATIENT SURVEYS:  LEFS 48/80  3/6: 56/80  COGNITION: Overall cognitive status: Within functional limits for tasks assessed     EDEMA:  Medial left ankle moderate   POSTURE: moderate to severe pes planus bil Unable to single leg stand on left, in addition to medial ankle collapse left pelvic drop indicating gluteal weakness  PALPATION: Marked tenderness posterior tibialis  LOWER EXTREMITY ROM: ankle ROM WFLS although ankle inversion is painful; decreased gastroc muscle length  LOWER EXTREMITY MMT:   Ankle: DF 4/5; PF 4-/5, Inv 3+/5, eversion 4/5 Foot intrinsics 4-/5  GAIT: Comments: wearing shows without arch support  FUNCTIONAL  TESTS: 5X SIT TO STAND: 12.45 sec  TUG: 8.65 sec  TREATMENT DATE: 12/15/23 Nu-Step L3  10 min (while discussing current status) Calf press seat 6 bilateral 40# 10x; 2nd set 10x Standing Y reaches spread apart more Single leg RDL with 5# 10x Sit to stand chair holding 5# goblet style 10x Standing with blue loop around feet side step the length of the railing  Seated isometric inversion with red band 5 sec hold 10x Single leg Standing on foam with left forward/back kicks 10x (very challenging) LEFS Therapeutic activity: standing, walking, bearing weight on affected extremity Ionto #6 with dexamethasone (6 hour wear time) to medial ankle in the area of the posterior tibial tendon, instruction on wearing time and any adverse effects TREATMENT DATE: 12/13/23 Nu-Step L3  5 min (while discussing current status) Discussed walking program 5-10 min initially Discussion on expected course of recovery Plan of care discussion (decreased treatment frequency at recert) Calf press seat 6 bilateral 30# 12x (some difficulty keeping feet on the platform, try more weight next time) Standing isometric heel raise with side step the length of the railing 3x Seated isometric inversion with yellow band 5 sec hold 10x Standing small green ball between heels squeeze with heel raise 5x 5 sec hold 20 inch box sit to stands 12x (cue to press heels into the floor) Therapeutic activity: standing, walking, bearing weight on affected extremity Ionto #5 with dexamethasone (6 hour wear time) to medial ankle in the area of the posterior tibial tendon, instruction on wearing time and any adverse effects TREATMENT DATE: 12/08/23 Nu-Step L3  5 min (while discussing current status) Rocker board on wall calf stretch 30 sec holds 5x Standing isometric heel raise with side to side weight shift 10x Seated isometric inversion with yellow band 5 sec hold 10x Seated small green ball between heels squeeze with heel raise 10x; 2nd set  with 7# resting on knee 10x Single leg [place] dead lift to reach knee level stool 2 sets of 8 (2 finger support) WB on left with 8 inch step tap 5 sec holds (next to railing ) Seated green band plantarflexion/inversion 2x10 Forward and back stepping with right leg (WB on left) 10x Lateral step over target with right leg (WB on left) 10x 20 inch box sit to stands 10x (cue to press heels into the floor) Therapeutic activity: standing, walking, bearing weight on affected extremity Ionto #4 with dexamethasone (6 hour wear time) to medial ankle in the area of the posterior tibial tendon, instruction on wearing time and any adverse effects TREATMENT DATE: 12/06/23 Nu-Step L1 5 min (while discussing current status) Seated soleus raise with 5# kettlebell on knee 2 x 10 Seated purple ball squeeze isometric 5 sec hold 10x  Seated small green ball between heels squeeze with heel raise 10x Seated green band ankle PF with green band 7x; PF with inversion 7x Standing on foam:  2 leg weight shifts SLS on floor  Standing short foot/lifts off pen (very challenging "burns") Y reaches with UE support 10x Towel scrunches seated  Seated with toes on 1/2 foam roll with 5# resting on knee heel lifts 2x10 Therapeutic activity: standing, walking, bearing weight on affected extremity Ionto #3 with dexamethasone (6 hour wear time) to medial ankle in the area of the posterior tibial tendon, instruction on wearing time and any adverse effects  TREATMENT DATE: 11/25/23 Nu-Step L3 10 min (while discussing expected course of recovery) Seated arch lifts (off pen) 10x 5 sec  Marble pick up x 20 marbles Seated soleus raise with 5# kettlebell on knee 2 x 10 Seated purple ball squeeze isometric 5 sec hold 10x  Half roll inversion/eversion x 20 with slight hold on each side Towel push inv/ev 2 x 10 each direction Toe scrunches 2 x 20 Seated small green ball between heels squeeze with heel raise 10x Ionto #2 with  dexamethasone (6 hour wear time) to medial ankle in the area of the posterior tibial tendon, instruction on wearing time and any adverse effects PATIENT EDUCATION:  Education details: Educated patient on anatomy and physiology of current symptoms, prognosis, plan of care as well as initial self care strategies to promote recovery  Person educated: Patient Education method: Explanation Education comprehension: verbalized understanding  HOME EXERCISE PROGRAM: Access Code: [email or web address] URL: [email or web address] Date: 11/04/2023 Prepared by: Lavinia Sharps  Exercises - Isometric Ankle Inversion  - 1 x daily - 7 x weekly - 1 sets - 10 reps - 5 hold - Seated Arch Lifts  - 1 x daily - 7 x weekly - 1 sets - 10 reps - 5 hold - Seated Toe Towel Scrunches  - 1 x daily - 7 x weekly - 1 sets - 10 reps - Toe Yoga - Alternating Great Toe and Lesser Toe Extension  - 1 x daily - 7 x weekly - 1 sets - 10 reps - Standing Heel Raise with Support  - 1 x daily - 7 x weekly - 1 sets - 10 reps - 5 hold - Single Leg Stance with Support  - 1 x daily - 7 x weekly - 1 sets - 10 reps - 5 hold - Standing Gastroc Stretch at Counter  - 1 x daily - 7 x weekly - 3 sets - 10 reps - Clamshell  - 1 x daily - 7 x weekly - 1 sets - 10 reps - Sidelying Hip Abduction  - 1 x daily - 7 x weekly - 1 sets - 10 reps  ASSESSMENT:  CLINICAL IMPRESSION: Good improvement with LEFS outcome score by 8 points and progressing with rehab goals.  Able to increase resistance band strength with inversion and add dumbbell weight to sit to stands and RDLs.  She is most challenged with proprioceptive exercise barefoot on foam targeting intrinsic foot strength.  Pain level remains at a tolerable level throughout session. Therapist monitoring response to exercise and adjusting number of reps or resistance as appropriate.   OBJECTIVE IMPAIRMENTS: decreased activity tolerance, difficulty walking, decreased strength, increased edema,  increased fascial restrictions, impaired perceived functional ability, postural dysfunction, and pain.   ACTIVITY LIMITATIONS: standing, stairs, and locomotion level  PARTICIPATION LIMITATIONS: meal prep, cleaning, laundry, shopping, and community activity  PERSONAL FACTORS: 1-2 comorbidities: previous history of plantar fascitis, no prior ex program  are also affecting patient's functional outcome.   REHAB POTENTIAL: Good  CLINICAL DECISION MAKING: Stable/uncomplicated  EVALUATION COMPLEXITY: Low  GOALS: Goals reviewed with patient? Yes  SHORT TERM GOALS: Target date: 12/02/2023   The patient will demonstrate knowledge of basic self care strategies and exercises to promote healing  Baseline: Goal status: MET 11/17/23  2.  The patient will report a 30% improvement in pain levels with functional activities which are currently difficult including standing and walking Baseline:  Goal status: In progress 11/17/23  3.  Improved postural alignment and strength to allow for standing and walking throughout the day with moderate difficulty rather than quite a bit of difficulty Baseline:  Goal status: met 2/27    LONG TERM GOALS: Target date: 12/30/2023   The patient will be independent in a safe self progression of a home exercise program to promote further recovery of function  Baseline:  Goal status: ongoing  2.  The patient will report a 60% improvement in pain levels with functional activities which are currently difficult including standing and walking Baseline:  Goal status: ongoing  3.  The patient will have improved LE strength needed to ascend and descend steps reciprocally  Baseline:  Goal status: ongoing  4.  Patient able to walk 1 mile with pain level <5/10 Baseline:  Goal status: ongoing  5.  LEFS improved to  60/80 indicating improved function with less pain Baseline:  Goal status: partially met 56/80 on 3/6    PLAN:  PT FREQUENCY: 2x/week  PT DURATION:  8 weeks  PLANNED INTERVENTIONS: 97164- PT Re-evaluation, 97110-Therapeutic exercises, 97530- Therapeutic activity, [id number]- Neuromuscular re-education, 97535- Self Care, 16109- Manual therapy, [id number]- Aquatic Therapy, 97014- Electrical stimulation (unattended), [id number]- Electrical stimulation (manual), [id number]- Ultrasound, 60454- Ionotophoresis 4mg /ml Dexamethasone, Patient/Family education, Taping, Dry Needling, Joint mobilization, Cryotherapy, and Moist heat  PLAN FOR NEXT SESSION: ionto series completed;   DN posterior tib (limited amount);  Ankle/foot strengthening; knee and hip strength for improved alignment, continue education on supportive shoe with arch support; glute and quad strength  Lavinia Sharps, PT 12/15/23 2:47 PM Phone: [phone] Fax: [phone]

## 2023-12-22 ENCOUNTER — Ambulatory Visit: Payer: Medicare Other | Admitting: Physical Therapy

## 2023-12-22 DIAGNOSIS — M25572 Pain in left ankle and joints of left foot: Secondary | ICD-10-CM

## 2023-12-22 DIAGNOSIS — R262 Difficulty in walking, not elsewhere classified: Secondary | ICD-10-CM

## 2023-12-22 DIAGNOSIS — M6281 Muscle weakness (generalized): Secondary | ICD-10-CM | POA: Diagnosis not present

## 2023-12-22 DIAGNOSIS — R252 Cramp and spasm: Secondary | ICD-10-CM | POA: Diagnosis not present

## 2023-12-22 NOTE — Therapy (Signed)
 OUTPATIENT PHYSICAL THERAPY LOWER EXTREMITY  TREATMENT NOTE   Patient Name: Cynthia Berry MRN: [id number] DOB:Feb 17, 1946, 78 y.o., female Today's Date: 12/22/2023    END OF SESSION:  PT End of Session - 12/22/23 1404     Visit Number 11    Date for PT Re-Evaluation 12/30/23    Authorization Type Medicare    Progress Note Due on Visit 20    PT Start Time 1403    PT Stop Time 1445    PT Time Calculation (min) 42 min    Activity Tolerance Patient tolerated treatment well             Past Medical History:  Diagnosis Date   Anemia    Anxiety    Pt very anxious/sensitive to certain meds!   COVID 2022   Depression    Hx of adenomatous polyp of colon 04/30/2016   Hyperlipidemia    Hypertension    Post-operative nausea and vomiting    due to nerves   Past Surgical History:  Procedure Laterality Date   TONSILLECTOMY     as child   Patient Active Problem List   Diagnosis Date Noted   Osteopenia 03/27/2019   Morbid obesity (HCC) 04/19/2017   Hx of adenomatous polyp of colon 04/30/2016   Allergic rhinitis 12/28/2014   Gout 12/28/2014   Hyperglycemia 08/31/2013   Hyperlipidemia 09/30/2009   Depression 05/22/2007   Essential hypertension 05/22/2007    PCP: Shelva Majestic MD  REFERRING PROVIDER: Rodolph Bong MD  REFERRING DIAG: posterior tibialis dysfunction; left ankle pain M25,572   THERAPY DIAG:  Pain in left ankle and joints of left foot  Difficulty in walking, not elsewhere classified  Muscle weakness (generalized)  Rationale for Evaluation and Treatment: Rehabilitation  ONSET DATE: Early November  SUBJECTIVE:   SUBJECTIVE STATEMENT: My foot is doing good.  I had one day where it acted up but then it was OK.     I don't do stairs b/c I have weak knees typically one at a time, not painful- had PT for LE strengthening and never could do stairs reciprocally, I didn't stick with it  I've joined [employer] for 2 years but I haven't gone yet  PERTINENT  HISTORY: History of left plantar fascitis; HTN; osteopenia; depression; I had torn ligaments in college right leg PAIN:   Are you having pain? Yes NPRS scale: 0/10 up and down pain levels depending on the day Pain location: left medial ankle pain and swelling  Pain orientation: Left and Medial  PAIN TYPE: burning Pain description: burning  Aggravating factors: standing a long time; turn foot inward, walking a lot (a day long without resting);hobble at first after sitting Relieving factors: boot, ankle support  PRECAUTIONS: None   WEIGHT BEARING RESTRICTIONS: No  FALLS:  Has patient fallen in last 6 months? No  LIVING ENVIRONMENT: Has upstairs but bedroom downstairs  Leisure: travel, stay on the go; I don't like to walk though  OCCUPATION: retired Cabin crew ed Runner, broadcasting/film/video  PLOF: Independent  PATIENT GOALS: strengthen my legs; get around like I want to; be able to go on step without buckling  NEXT MD VISIT: no scheduled follow up  OBJECTIVE:  Note: Objective measures were completed at Evaluation unless otherwise noted.  DIAGNOSTIC FINDINGS: none  PATIENT SURVEYS:  LEFS 48/80 3/6: 56/80  COGNITION: Overall cognitive status: Within functional limits for tasks assessed     EDEMA:  Medial left ankle moderate   POSTURE: moderate to severe pes planus bil  Unable to single leg stand on left, in addition to medial ankle collapse left pelvic drop indicating gluteal weakness  PALPATION: Marked tenderness posterior tibialis  LOWER EXTREMITY ROM: ankle ROM WFLS although ankle inversion is painful; decreased gastroc muscle length  LOWER EXTREMITY MMT:   Ankle: DF 4/5; PF 4-/5, Inv 3+/5, eversion 4/5 Foot intrinsics 4-/5  GAIT: Comments: wearing shows without arch support  FUNCTIONAL TESTS: 5X SIT TO STAND: 12.45 sec  TUG: 8.65 sec TREATMENT DATE: 12/22/23 Nu-Step L3  10 min (while discussing current status) Leg press seat 6: bilateral calf press 40# 10x; 2nd set bil  leg press 60# 10x Single leg RDL with 5# 10x right/left  Sit to stand chair holding 5# goblet style 10x Seated medium yellow loop ankle foot eversion 10x Seated small green isometric inversion 10x 5 sec holds Standing on toes: side stepping the length of the railing 5 laps Single leg Standing on foam 4 ways 5x  2 sets right/left (very challenging) Standing forward heel taps to low cone 2 sets of 5 Seated yellow medium loop around knees: left heel/toe lifts with 5# resting on knee 2x10 Curtsey 5x right/left Therapeutic activity: standing, walking, bearing weight on affected extremity  TREATMENT DATE: 12/15/23 Nu-Step L3  10 min (while discussing current status) Calf press seat 6 bilateral 40# 10x; 2nd set 10x Standing Y reaches spread apart more Single leg RDL with 5# 10x Sit to stand chair holding 5# goblet style 10x Standing with blue loop around feet side step the length of the railing  Seated isometric inversion with red band 5 sec hold 10x Single leg Standing on foam with left forward/back kicks 10x (very challenging) LEFS Therapeutic activity: standing, walking, bearing weight on affected extremity Ionto #6 with dexamethasone (6 hour wear time) to medial ankle in the area of the posterior tibial tendon, instruction on wearing time and any adverse effects TREATMENT DATE: 12/13/23 Nu-Step L3  5 min (while discussing current status) Discussed walking program 5-10 min initially Discussion on expected course of recovery Plan of care discussion (decreased treatment frequency at recert) Calf press seat 6 bilateral 30# 12x (some difficulty keeping feet on the platform, try more weight next time) Standing isometric heel raise with side step the length of the railing 3x Seated isometric inversion with yellow band 5 sec hold 10x Standing small green ball between heels squeeze with heel raise 5x 5 sec hold 20 inch box sit to stands 12x (cue to press heels into the floor) Therapeutic activity:  standing, walking, bearing weight on affected extremity Ionto #5 with dexamethasone (6 hour wear time) to medial ankle in the area of the posterior tibial tendon, instruction on wearing time and any adverse effects TREATMENT DATE: 12/08/23 Nu-Step L3  5 min (while discussing current status) Rocker board on wall calf stretch 30 sec holds 5x Standing isometric heel raise with side to side weight shift 10x Seated isometric inversion with yellow band 5 sec hold 10x Seated small green ball between heels squeeze with heel raise 10x; 2nd set with 7# resting on knee 10x Single leg [place] dead lift to reach knee level stool 2 sets of 8 (2 finger support) WB on left with 8 inch step tap 5 sec holds (next to railing ) Seated green band plantarflexion/inversion 2x10 Forward and back stepping with right leg (WB on left) 10x Lateral step over target with right leg (WB on left) 10x 20 inch box sit to stands 10x (cue to press heels into the floor) Therapeutic  activity: standing, walking, bearing weight on affected extremity Ionto #4 with dexamethasone (6 hour wear time) to medial ankle in the area of the posterior tibial tendon, instruction on wearing time and any adverse effects PATIENT EDUCATION:  Education details: Educated patient on anatomy and physiology of current symptoms, prognosis, plan of care as well as initial self care strategies to promote recovery  Person educated: Patient Education method: Explanation Education comprehension: verbalized understanding  HOME EXERCISE PROGRAM: Access Code: [email or web address] URL: [email or web address] Date: 11/04/2023 Prepared by: Lavinia Sharps  Exercises - Isometric Ankle Inversion  - 1 x daily - 7 x weekly - 1 sets - 10 reps - 5 hold - Seated Arch Lifts  - 1 x daily - 7 x weekly - 1 sets - 10 reps - 5 hold - Seated Toe Towel Scrunches  - 1 x daily - 7 x weekly - 1 sets - 10 reps - Toe Yoga - Alternating Great Toe and Lesser Toe Extension  - 1 x  daily - 7 x weekly - 1 sets - 10 reps - Standing Heel Raise with Support  - 1 x daily - 7 x weekly - 1 sets - 10 reps - 5 hold - Single Leg Stance with Support  - 1 x daily - 7 x weekly - 1 sets - 10 reps - 5 hold - Standing Gastroc Stretch at Counter  - 1 x daily - 7 x weekly - 3 sets - 10 reps - Clamshell  - 1 x daily - 7 x weekly - 1 sets - 10 reps - Sidelying Hip Abduction  - 1 x daily - 7 x weekly - 1 sets - 10 reps  ASSESSMENT:  CLINICAL IMPRESSION: Therapist providing verbal cues to optimize technique with  exercises in order to achieve the greatest benefit.  Decreased pain level during session.  More symmetrical right vs. Left  mobility and tolerance for weight bearing. Tip toe side stepping caused minimal discomfort.  Less reliance on Ues for support and balance.    OBJECTIVE IMPAIRMENTS: decreased activity tolerance, difficulty walking, decreased strength, increased edema, increased fascial restrictions, impaired perceived functional ability, postural dysfunction, and pain.   ACTIVITY LIMITATIONS: standing, stairs, and locomotion level  PARTICIPATION LIMITATIONS: meal prep, cleaning, laundry, shopping, and community activity  PERSONAL FACTORS: 1-2 comorbidities: previous history of plantar fascitis, no prior ex program  are also affecting patient's functional outcome.   REHAB POTENTIAL: Good  CLINICAL DECISION MAKING: Stable/uncomplicated  EVALUATION COMPLEXITY: Low   GOALS: Goals reviewed with patient? Yes  SHORT TERM GOALS: Target date: 12/02/2023   The patient will demonstrate knowledge of basic self care strategies and exercises to promote healing  Baseline: Goal status: MET 11/17/23  2.  The patient will report a 30% improvement in pain levels with functional activities which are currently difficult including standing and walking Baseline:  Goal status: In progress 11/17/23  3.  Improved postural alignment and strength to allow for standing and walking throughout  the day with moderate difficulty rather than quite a bit of difficulty Baseline:  Goal status: met 2/27    LONG TERM GOALS: Target date: 12/30/2023   The patient will be independent in a safe self progression of a home exercise program to promote further recovery of function  Baseline:  Goal status: ongoing  2.  The patient will report a 60% improvement in pain levels with functional activities which are currently difficult including standing and walking Baseline:  Goal status: ongoing  3.  The patient will  have improved LE strength needed to ascend and descend steps reciprocally  Baseline:  Goal status: ongoing  4.  Patient able to walk 1 mile with pain level <5/10 Baseline:  Goal status: ongoing  5.  LEFS improved to  60/80 indicating improved function with less pain Baseline:  Goal status: partially met 56/80 on 3/6    PLAN:  PT FREQUENCY: 2x/week  PT DURATION: 8 weeks  PLANNED INTERVENTIONS: 97164- PT Re-evaluation, 97110-Therapeutic exercises, 97530- Therapeutic activity, [id number]- Neuromuscular re-education, 97535- Self Care, 16109- Manual therapy, [id number]- Aquatic Therapy, 97014- Electrical stimulation (unattended), [id number]- Electrical stimulation (manual), [id number]- Ultrasound, 60454- Ionotophoresis 4mg /ml Dexamethasone, Patient/Family education, Taping, Dry Needling, Joint mobilization, Cryotherapy, and Moist heat  PLAN FOR NEXT SESSION:assess progress next visit, consider tapering of visits;  ionto series completed;   DN posterior tib (limited amount);  Ankle/foot strengthening; knee and hip strength for improved alignment, continue education on supportive shoe with arch support; glute and quad strength  Lavinia Sharps, PT 12/22/23 5:32 PM Phone: [phone] Fax: [phone]

## 2023-12-29 ENCOUNTER — Ambulatory Visit: Payer: Medicare Other | Admitting: Physical Therapy

## 2023-12-29 DIAGNOSIS — M6281 Muscle weakness (generalized): Secondary | ICD-10-CM

## 2023-12-29 DIAGNOSIS — R252 Cramp and spasm: Secondary | ICD-10-CM | POA: Diagnosis not present

## 2023-12-29 DIAGNOSIS — R262 Difficulty in walking, not elsewhere classified: Secondary | ICD-10-CM

## 2023-12-29 DIAGNOSIS — M25572 Pain in left ankle and joints of left foot: Secondary | ICD-10-CM

## 2023-12-29 NOTE — Therapy (Signed)
 OUTPATIENT PHYSICAL THERAPY LOWER EXTREMITY  TREATMENT NOTE/RECERTIFICATION   Patient Name: Cynthia Berry MRN: [id number] DOB:[birth year], 78 y.o., female Today's Date: 12/29/2023    END OF SESSION:  PT End of Session - 12/29/23 1030     Visit Number 12    Date for PT Re-Evaluation 02/23/24    Authorization Type Medicare    Progress Note Due on Visit 20    PT Start Time 1026   late   PT Stop Time 1059    PT Time Calculation (min) 33 min    Activity Tolerance Patient tolerated treatment well             Past Medical History:  Diagnosis Date   Anemia    Anxiety    Pt very anxious/sensitive to certain meds!   COVID 2022   Depression    Hx of adenomatous polyp of colon 04/30/2016   Hyperlipidemia    Hypertension    Post-operative nausea and vomiting    due to nerves   Past Surgical History:  Procedure Laterality Date   TONSILLECTOMY     as child   Patient Active Problem List   Diagnosis Date Noted   Osteopenia 03/27/2019   Morbid obesity (HCC) 04/19/2017   Hx of adenomatous polyp of colon 04/30/2016   Allergic rhinitis 12/28/2014   Gout 12/28/2014   Hyperglycemia 08/31/2013   Hyperlipidemia 09/30/2009   Depression 05/22/2007   Essential hypertension 05/22/2007    PCP: Shelva Majestic MD  REFERRING PROVIDER: Rodolph Bong MD  REFERRING DIAG: posterior tibialis dysfunction; left ankle pain M25,572   THERAPY DIAG:  Pain in left ankle and joints of left foot  Difficulty in walking, not elsewhere classified  Muscle weakness (generalized)  Rationale for Evaluation and Treatment: Rehabilitation  ONSET DATE: Early November  SUBJECTIVE:   SUBJECTIVE STATEMENT: Feels pretty good.  Trying to do normal things now. Walking has been good. Unsure of the distance maybe 1/4 lap.   I don't do stairs b/c I have weak knees typically one at a time, not painful- had PT for LE strengthening and never could do stairs reciprocally, I didn't stick with it  I've joined  [employer] for 2 years but I haven't gone yet  PERTINENT HISTORY: History of left plantar fascitis; HTN; osteopenia; depression; I had torn ligaments in college right leg PAIN:   Are you having pain? Yes NPRS scale: 0/10 up and down pain levels depending on the day Pain location: left medial ankle pain and swelling  Pain orientation: Left and Medial  PAIN TYPE: burning Pain description: burning  Aggravating factors: standing a long time; turn foot inward, walking a lot (a day long without resting);hobble at first after sitting Relieving factors: boot, ankle support  PRECAUTIONS: None   WEIGHT BEARING RESTRICTIONS: No  FALLS:  Has patient fallen in last 6 months? No  LIVING ENVIRONMENT: Has upstairs but bedroom downstairs  Leisure: travel, stay on the go; I don't like to walk though  OCCUPATION: retired Cabin crew ed Runner, broadcasting/film/video  PLOF: Independent  PATIENT GOALS: strengthen my legs; get around like I want to; be able to go on step without buckling  NEXT MD VISIT: no scheduled follow up  OBJECTIVE:  Note: Objective measures were completed at Evaluation unless otherwise noted.  DIAGNOSTIC FINDINGS: none  PATIENT SURVEYS:  LEFS 48/80 3/6: 56/80  COGNITION: Overall cognitive status: Within functional limits for tasks assessed     EDEMA:  Medial left ankle moderate   POSTURE: moderate to severe pes  planus bil Unable to single leg stand on left, in addition to medial ankle collapse left pelvic drop indicating gluteal weakness  PALPATION: Marked tenderness posterior tibialis  LOWER EXTREMITY ROM: ankle ROM WFLS although ankle inversion is painful; decreased gastroc muscle length  LOWER EXTREMITY MMT:   Ankle: DF 4/5; PF 4-/5, Inv 3+/5, eversion 4/5 Foot intrinsics 4-/5  GAIT: Comments: wearing shows without arch support  FUNCTIONAL TESTS: 5X SIT TO STAND: 12.45 sec  TUG: 8.65 sec TREATMENT DATE: 12/29/23 Nu-Step L3  10 min (while discussing current  status) [facility] Right Start 9 week info (wrote list of ex's and amounts of resistance currently doing on the back) Leg press seat 6: bilateral calf press 40# 10x2 2nd set bil leg press 60# 10x2 Single leg RDL with 7# 10x right/left  Sit to stand chair holding 7# goblet style 10x Therapeutic activity: standing, walking, bearing weight on affected extremity  TREATMENT DATE: 12/22/23 Nu-Step L3  10 min (while discussing current status) Leg press seat 6: bilateral calf press 40# 10x; 2nd set bil leg press 60# 10x Single leg RDL with 5# 10x right/left  Sit to stand chair holding 5# goblet style 10x Seated medium yellow loop ankle foot eversion 10x Seated small green isometric inversion 10x 5 sec holds Standing on toes: side stepping the length of the railing 5 laps Single leg Standing on foam 4 ways 5x  2 sets right/left (very challenging) Standing forward heel taps to low cone 2 sets of 5 Seated yellow medium loop around knees: left heel/toe lifts with 5# resting on knee 2x10 Curtsey 5x right/left Therapeutic activity: standing, walking, bearing weight on affected extremity  TREATMENT DATE: 12/15/23 Nu-Step L3  10 min (while discussing current status) Calf press seat 6 bilateral 40# 10x; 2nd set 10x Standing Y reaches spread apart more Single leg RDL with 5# 10x Sit to stand chair holding 5# goblet style 10x Standing with blue loop around feet side step the length of the railing  Seated isometric inversion with red band 5 sec hold 10x Single leg Standing on foam with left forward/back kicks 10x (very challenging) LEFS Therapeutic activity: standing, walking, bearing weight on affected extremity Ionto #6 with dexamethasone (6 hour wear time) to medial ankle in the area of the posterior tibial tendon, instruction on wearing time and any adverse effects TREATMENT DATE: 12/13/23 Nu-Step L3  5 min (while discussing current status) Discussed walking program 5-10 min initially Discussion on  expected course of recovery Plan of care discussion (decreased treatment frequency at recert) Calf press seat 6 bilateral 30# 12x (some difficulty keeping feet on the platform, try more weight next time) Standing isometric heel raise with side step the length of the railing 3x Seated isometric inversion with yellow band 5 sec hold 10x Standing small green ball between heels squeeze with heel raise 5x 5 sec hold 20 inch box sit to stands 12x (cue to press heels into the floor) Therapeutic activity: standing, walking, bearing weight on affected extremity Ionto #5 with dexamethasone (6 hour wear time) to medial ankle in the area of the posterior tibial tendon, instruction on wearing time and any adverse effects PATIENT EDUCATION:  Education details: Educated patient on anatomy and physiology of current symptoms, prognosis, plan of care as well as initial self care strategies to promote recovery  Person educated: Patient Education method: Explanation Education comprehension: verbalized understanding  HOME EXERCISE PROGRAM: Access Code: [email or web address] URL: [email or web address] Date: 11/04/2023 Prepared by: Lavinia Sharps  Exercises - Isometric Ankle Inversion  -  1 x daily - 7 x weekly - 1 sets - 10 reps - 5 hold - Seated Arch Lifts  - 1 x daily - 7 x weekly - 1 sets - 10 reps - 5 hold - Seated Toe Towel Scrunches  - 1 x daily - 7 x weekly - 1 sets - 10 reps - Toe Yoga - Alternating Great Toe and Lesser Toe Extension  - 1 x daily - 7 x weekly - 1 sets - 10 reps - Standing Heel Raise with Support  - 1 x daily - 7 x weekly - 1 sets - 10 reps - 5 hold - Single Leg Stance with Support  - 1 x daily - 7 x weekly - 1 sets - 10 reps - 5 hold - Standing Gastroc Stretch at Counter  - 1 x daily - 7 x weekly - 3 sets - 10 reps - Clamshell  - 1 x daily - 7 x weekly - 1 sets - 10 reps - Sidelying Hip Abduction  - 1 x daily - 7 x weekly - 1 sets - 10 reps  ASSESSMENT:  CLINICAL IMPRESSION: Susana  reports decreasing pain levels and return to usual activities.  She has not attempted longer distance walking but feels she could walk 1/4 mile. The patient would benefit from a continuation of skilled PT for a further progression of strengthening and functional mobility.  Will continue to update and promote independence in a HEP and initiation of a gym program ([employer]) needed for a return to the highest functional level possible with ADLs.  Will taper visits to promote self efficacy.     OBJECTIVE IMPAIRMENTS: decreased activity tolerance, difficulty walking, decreased strength, increased edema, increased fascial restrictions, impaired perceived functional ability, postural dysfunction, and pain.   ACTIVITY LIMITATIONS: standing, stairs, and locomotion level  PARTICIPATION LIMITATIONS: meal prep, cleaning, laundry, shopping, and community activity  PERSONAL FACTORS: 1-2 comorbidities: previous history of plantar fascitis, no prior ex program  are also affecting patient's functional outcome.   REHAB POTENTIAL: Good  CLINICAL DECISION MAKING: Stable/uncomplicated  EVALUATION COMPLEXITY: Low   GOALS: Goals reviewed with patient? Yes  SHORT TERM GOALS: Target date: 12/02/2023   The patient will demonstrate knowledge of basic self care strategies and exercises to promote healing  Baseline: Goal status: MET 11/17/23  2.  The patient will report a 30% improvement in pain levels with functional activities which are currently difficult including standing and walking Baseline:  Goal status: In progress 11/17/23  3.  Improved postural alignment and strength to allow for standing and walking throughout the day with moderate difficulty rather than quite a bit of difficulty Baseline:  Goal status: met 2/27    LONG TERM GOALS: Target date: 02/23/2024   The patient will be independent in a safe self progression of a home exercise program to promote further recovery of function  Baseline:   Goal status: ongoing  2.  The patient will report a 60% improvement in pain levels with functional activities which are currently difficult including standing and walking Baseline:  Goal status: ongoing  3.  The patient will have improved LE strength needed to ascend and descend steps reciprocally  Baseline:  Goal status: ongoing  4.  Patient able to walk 1 mile with pain level <5/10 Baseline:  Goal status: ongoing  5.  LEFS improved to  60/80 indicating improved function with less pain Baseline:  Goal status: partially met 56/80 on 3/6    PLAN:  PT FREQUENCY: every  2-3 weeks PT DURATION: 8 weeks  PLANNED INTERVENTIONS: 97164- PT Re-evaluation, 97110-Therapeutic exercises, 97530- Therapeutic activity, [id number]- Neuromuscular re-education, 97535- Self Care, 16606- Manual therapy, [id number]- Aquatic Therapy, 97014- Electrical stimulation (unattended), [id number]- Electrical stimulation (manual), [id number]- Ultrasound, 30160- Ionotophoresis 4mg /ml Dexamethasone, Patient/Family education, Taping, Dry Needling, Joint mobilization, Cryotherapy, and Moist heat  PLAN FOR NEXT SESSION:tapering of visits to promote independence; pt given info on [facility] Right Start program;   ionto series completed;   DN posterior tib (limited amount);  Ankle/foot strengthening; knee and hip strength for improved alignment, continue education on supportive shoe with arch support; glute and quad strength  Lavinia Sharps, PT 12/29/23 11:05 AM Phone: [phone] Fax: [phone]

## 2023-12-30 ENCOUNTER — Other Ambulatory Visit: Payer: Self-pay | Admitting: Family Medicine

## 2023-12-30 DIAGNOSIS — Z1231 Encounter for screening mammogram for malignant neoplasm of breast: Secondary | ICD-10-CM

## 2024-01-30 ENCOUNTER — Ambulatory Visit
Admission: RE | Admit: 2024-01-30 | Discharge: 2024-01-30 | Disposition: A | Discharge status: Home / Self Care | Source: Ambulatory Visit | Attending: Family Medicine | Admitting: Family Medicine

## 2024-01-30 DIAGNOSIS — Z1231 Encounter for screening mammogram for malignant neoplasm of breast: Secondary | ICD-10-CM

## 2024-02-02 ENCOUNTER — Encounter: Admitting: Physical Therapy

## 2024-02-02 ENCOUNTER — Ambulatory Visit

## 2024-03-06 ENCOUNTER — Emergency Department (HOSPITAL_COMMUNITY)

## 2024-03-06 ENCOUNTER — Encounter (HOSPITAL_COMMUNITY): Payer: Self-pay

## 2024-03-06 ENCOUNTER — Emergency Department (HOSPITAL_COMMUNITY)
Admission: EM | Admit: 2024-03-06 | Discharge: 2024-03-07 | Disposition: A | Discharge status: Home / Self Care | Attending: Emergency Medicine | Admitting: Emergency Medicine

## 2024-03-06 DIAGNOSIS — S0181XA Laceration without foreign body of other part of head, initial encounter: Secondary | ICD-10-CM | POA: Diagnosis not present

## 2024-03-06 DIAGNOSIS — W01198A Fall on same level from slipping, tripping and stumbling with subsequent striking against other object, initial encounter: Secondary | ICD-10-CM | POA: Insufficient documentation

## 2024-03-06 DIAGNOSIS — I6529 Occlusion and stenosis of unspecified carotid artery: Secondary | ICD-10-CM | POA: Diagnosis not present

## 2024-03-06 DIAGNOSIS — M25511 Pain in right shoulder: Secondary | ICD-10-CM | POA: Diagnosis not present

## 2024-03-06 DIAGNOSIS — S022XXA Fracture of nasal bones, initial encounter for closed fracture: Secondary | ICD-10-CM | POA: Diagnosis not present

## 2024-03-06 DIAGNOSIS — Z23 Encounter for immunization: Secondary | ICD-10-CM | POA: Diagnosis not present

## 2024-03-06 DIAGNOSIS — M79629 Pain in unspecified upper arm: Secondary | ICD-10-CM | POA: Diagnosis not present

## 2024-03-06 DIAGNOSIS — S01111A Laceration without foreign body of right eyelid and periocular area, initial encounter: Secondary | ICD-10-CM | POA: Diagnosis not present

## 2024-03-06 DIAGNOSIS — M7989 Other specified soft tissue disorders: Secondary | ICD-10-CM | POA: Insufficient documentation

## 2024-03-06 DIAGNOSIS — S42291A Other displaced fracture of upper end of right humerus, initial encounter for closed fracture: Secondary | ICD-10-CM | POA: Diagnosis not present

## 2024-03-06 DIAGNOSIS — S4990XA Unspecified injury of shoulder and upper arm, unspecified arm, initial encounter: Secondary | ICD-10-CM | POA: Diagnosis not present

## 2024-03-06 DIAGNOSIS — I10 Essential (primary) hypertension: Secondary | ICD-10-CM | POA: Insufficient documentation

## 2024-03-06 DIAGNOSIS — Z7982 Long term (current) use of aspirin: Secondary | ICD-10-CM | POA: Insufficient documentation

## 2024-03-06 DIAGNOSIS — M79621 Pain in right upper arm: Secondary | ICD-10-CM | POA: Diagnosis not present

## 2024-03-06 DIAGNOSIS — Z79899 Other long term (current) drug therapy: Secondary | ICD-10-CM | POA: Insufficient documentation

## 2024-03-06 DIAGNOSIS — R220 Localized swelling, mass and lump, head: Secondary | ICD-10-CM | POA: Diagnosis not present

## 2024-03-06 DIAGNOSIS — M19011 Primary osteoarthritis, right shoulder: Secondary | ICD-10-CM | POA: Diagnosis not present

## 2024-03-06 DIAGNOSIS — W19XXXA Unspecified fall, initial encounter: Secondary | ICD-10-CM

## 2024-03-06 DIAGNOSIS — S4991XA Unspecified injury of right shoulder and upper arm, initial encounter: Secondary | ICD-10-CM | POA: Diagnosis not present

## 2024-03-06 DIAGNOSIS — S3993XA Unspecified injury of pelvis, initial encounter: Secondary | ICD-10-CM | POA: Diagnosis not present

## 2024-03-06 DIAGNOSIS — S299XXA Unspecified injury of thorax, initial encounter: Secondary | ICD-10-CM | POA: Diagnosis not present

## 2024-03-06 DIAGNOSIS — S42201A Unspecified fracture of upper end of right humerus, initial encounter for closed fracture: Secondary | ICD-10-CM | POA: Diagnosis not present

## 2024-03-06 DIAGNOSIS — M79603 Pain in arm, unspecified: Secondary | ICD-10-CM | POA: Diagnosis not present

## 2024-03-06 MED ORDER — OXYCODONE-ACETAMINOPHEN 5-325 MG PO TABS
1.0000 | ORAL_TABLET | Freq: Once | ORAL | Status: AC
  Administered 2024-03-06: 1 via ORAL
  Filled 2024-03-06: qty 1

## 2024-03-06 MED ORDER — TETANUS-DIPHTH-ACELL PERTUSSIS 5-2.5-18.5 LF-MCG/0.5 IM SUSY
0.5000 mL | PREFILLED_SYRINGE | Freq: Once | INTRAMUSCULAR | Status: AC
  Administered 2024-03-06: 0.5 mL via INTRAMUSCULAR
  Filled 2024-03-06: qty 0.5

## 2024-03-06 MED ORDER — LIDOCAINE-EPINEPHRINE (PF) 2 %-1:200000 IJ SOLN
10.0000 mL | Freq: Once | INTRAMUSCULAR | Status: AC
  Administered 2024-03-06: 10 mL via INTRADERMAL
  Filled 2024-03-06: qty 20

## 2024-03-06 MED ORDER — LIDOCAINE-EPINEPHRINE-TETRACAINE (LET) TOPICAL GEL
3.0000 mL | Freq: Once | TOPICAL | Status: AC
  Administered 2024-03-06: 3 mL via TOPICAL
  Filled 2024-03-06: qty 3

## 2024-03-06 MED ORDER — ACETAMINOPHEN 325 MG PO TABS
650.0000 mg | ORAL_TABLET | Freq: Once | ORAL | Status: DC
  Filled 2024-03-06: qty 2

## 2024-03-06 NOTE — ED Provider Notes (Signed)
 [place] [facility] Provider Note   CSN: [id number] Arrival date & time: 03/06/24  1945     History {Add pertinent medical, surgical, social history, OB history to HPI:1} Chief Complaint  Patient presents with   Fall    Pt arrived by ems d/t fall where pt fell forward hitting right brow and right upper arm. Per ems, pt was helping friend that lost balance which caused her to fall. Pt has hematoma to right brow. No LOC / No Blood thinners    Cynthia Berry is a 78 y.o. female.   Fall Associated symptoms include headaches.  Patient presents after fall.  Medical history includes HLD, HTN, depression, gout, osteopenia, anemia.  Prior to arrival, she had a mechanical fall while helping her friend.  Her friend, who has poor balance, fell into her, causing the patient to fall to the ground.  She did strike her face and right arm.  She believes that her right arm was pinned under her as she fell.  She did not lose consciousness.  She denies use of blood thinners.  She struck the right periorbital area of her face.  She has since had pain to this area as well as her right upper arm.  She denies any other areas of discomfort.  She has not attempted to walk since the fall.     Home Medications Prior to Admission medications   Medication Sig Start Date End Date Taking? Authorizing Provider  ALPRAZolam  (XANAX ) 0.25 MG tablet Do not drive for 8 hours after use. take 1 tablet by mouth twice a day if needed 05/09/23   Almira Jaeger, MD  amLODipine  (NORVASC ) 2.5 MG tablet TAKE 1 TABLET BY MOUTH EVERY DAY 03/14/23   Almira Jaeger, MD  aspirin 81 MG tablet Take 81 mg by mouth daily.    [provider]  benzonatate  (TESSALON ) 100 MG capsule Take 1-2 capsules (100-200 mg total) by mouth 2 (two) times daily as needed for cough. 09/30/23   Versa Gore, NP  escitalopram  (LEXAPRO ) 20 MG tablet Take 1 tablet (20 mg total) by mouth daily. 05/09/23   Almira Jaeger, MD  rosuvastatin  (CRESTOR ) 5 MG tablet TAKE 1 TABLET (5 MG TOTAL) BY MOUTH DAILY. 03/14/23   Almira Jaeger, MD  triamcinolone  cream (KENALOG ) 0.1 % Apply topically 2 (two) times daily. For 7 days maximum 05/09/23   Almira Jaeger, MD      Allergies    Doxycycline , Penicillins, and Sulfa antibiotics    Review of Systems   Review of Systems  Musculoskeletal:  Positive for arthralgias and myalgias.  Skin:  Positive for wound.  Neurological:  Positive for headaches.  All other systems reviewed and are negative.   Physical Exam Updated Vital Signs BP (!) 157/89 (BP Location: Right Arm)   Pulse (!) 106   Temp 98.8 F (37.1 C) (Oral)   Ht 5\' 1"  (1.549 m)   Wt 97.5 kg   LMP  (LMP Unknown)   SpO2 100%   BMI 40.62 kg/m  Physical Exam Vitals and nursing note reviewed.  Constitutional:      General: She is not in acute distress.    Appearance: Normal appearance. She is well-developed. She is not ill-appearing, toxic-appearing or diaphoretic.  HENT:     Head: Normocephalic.     Comments: Right periorbital swelling, small laceration lateral to right eyebrow.  Hemostatic.    Right Ear: External ear normal.  Left Ear: External ear normal.     Nose: Nose normal.     Mouth/Throat:     Mouth: Mucous membranes are moist.  Eyes:     Extraocular Movements: Extraocular movements intact.     Conjunctiva/sclera: Conjunctivae normal.  Neck:     Comments: Cervical collar in place Cardiovascular:     Rate and Rhythm: Normal rate and regular rhythm.     Heart sounds: No murmur heard. Pulmonary:     Effort: Pulmonary effort is normal. No respiratory distress.     Breath sounds: Normal breath sounds.  Chest:     Chest wall: No tenderness.  Abdominal:     General: There is no distension.     Palpations: Abdomen is soft.     Tenderness: There is no abdominal tenderness.  Musculoskeletal:        General: Tenderness and signs of injury present.     Cervical back: Neck  supple.  Skin:    General: Skin is warm and dry.     Coloration: Skin is not jaundiced or pale.  Neurological:     General: No focal deficit present.     Mental Status: She is alert and oriented to person, place, and time.     Cranial Nerves: No cranial nerve deficit.     Sensory: No sensory deficit.     Motor: No weakness.     Coordination: Coordination normal.  Psychiatric:        Mood and Affect: Mood normal.        Behavior: Behavior normal.     ED Results / Procedures / Treatments   Labs (all labs ordered are listed, but only abnormal results are displayed) Labs Reviewed - No data to display  EKG None  Radiology No results found.  Procedures Procedures  {Document cardiac monitor, telemetry assessment procedure when appropriate:1}  Medications Ordered in ED Medications - No data to display  ED Course/ Medical Decision Making/ A&P   {   Click here for ABCD2, HEART and other calculatorsREFRESH Note before signing :1}                              Medical Decision Making  This patient presents to the ED for concern of ***, this involves an extensive number of treatment options, and is a complaint that carries with it a high risk of complications and morbidity.  The differential diagnosis includes ***   Co morbidities / Chronic conditions that complicate the patient evaluation  ***   Additional history obtained:  Additional history obtained from EMR External records from outside source obtained and reviewed including ***   Lab Tests:  I Ordered, and personally interpreted labs.  The pertinent results include:  ***   Imaging Studies ordered:  I ordered imaging studies including ***  I independently visualized and interpreted imaging which showed *** I agree with the radiologist interpretation   Cardiac Monitoring: / EKG:  The patient was maintained on a cardiac monitor.  I personally viewed and interpreted the cardiac monitored which showed an  underlying rhythm of: ***   Problem List / ED Course / Critical interventions / Medication management  Patient presents after ground-level fall.  This was caused by a friend who fell into her.  She struck the right side of her face as well as her right arm.  On arrival in the ED, vital signs notable for mild tachycardia and hypertension.  Patient  is alert and oriented.  She arrives in a cervical collar.  She has some right periorbital swelling with a small laceration lateral to her right eyebrow.  She has no focal neurologic deficits.  Patient describes pain in her right upper arm.  Distal pulses and sensation are intact.  She has good grip strength and is able to flex her elbow.  She has a difficult time flexing her shoulder due to the arm pain.  Patient has no chest or abdominal tenderness.  Tylenol  was ordered for analgesia.  She is due for tetanus and Tdap was ordered.  Imaging studies ordered as well.***. I ordered medication including ***   Reevaluation of the patient after these medicines showed that the patient *** I have reviewed the patients home medicines and have made adjustments as needed   Consultations Obtained:  I requested consultation with the ***,  and discussed lab and imaging findings as well as pertinent plan - they recommend: ***   Social Determinants of Health:  ***   Test / Admission - Considered:  ***   {Document critical care time when appropriate:1} {Document review of labs and clinical decision tools ie heart score, Chads2Vasc2 etc:1}  {Document your independent review of radiology images, and any outside records:1} {Document your discussion with family members, caretakers, and with consultants:1} {Document social determinants of health affecting pt's care:1} {Document your decision making why or why not admission, treatments were needed:1} Final Clinical Impression(s) / ED Diagnoses Final diagnoses:  None    Rx / DC Orders ED Discharge Orders      None

## 2024-03-06 NOTE — Progress Notes (Signed)
 Orthopedic Tech Progress Note Patient Details:  GLANDA SPANBAUER 12/11/1945 [id number]  Ortho Devices Type of Ortho Device: Shoulder immobilizer Ortho Device/Splint Location: RUE Ortho Device/Splint Interventions: Ordered, Application, Adjustment   Post Interventions Patient Tolerated: Poor Instructions Provided: Care of device  [place] A Florinda Hush 03/06/2024, 11:30 PM

## 2024-03-07 DIAGNOSIS — S42291A Other displaced fracture of upper end of right humerus, initial encounter for closed fracture: Secondary | ICD-10-CM | POA: Diagnosis not present

## 2024-03-07 LAB — CBC WITH DIFFERENTIAL/PLATELET
Abs Immature Granulocytes: 0.07 10*3/uL (ref 0.00–0.07)
Basophils Absolute: 0.1 10*3/uL (ref 0.0–0.1)
Basophils Relative: 0 %
Eosinophils Absolute: 0 10*3/uL (ref 0.0–0.5)
Eosinophils Relative: 0 %
HCT: 38.5 % (ref 36.0–46.0)
Hemoglobin: 13 g/dL (ref 12.0–15.0)
Immature Granulocytes: 0 %
Lymphocytes Relative: 9 %
Lymphs Abs: 1.5 10*3/uL (ref 0.7–4.0)
MCH: 30.2 pg (ref 26.0–34.0)
MCHC: 33.8 g/dL (ref 30.0–36.0)
MCV: 89.3 fL (ref 80.0–100.0)
Monocytes Absolute: 0.9 10*3/uL (ref 0.1–1.0)
Monocytes Relative: 6 %
Neutro Abs: 13.2 10*3/uL — ABNORMAL HIGH (ref 1.7–7.7)
Neutrophils Relative %: 85 %
Platelets: 273 10*3/uL (ref 150–400)
RBC: 4.31 MIL/uL (ref 3.87–5.11)
RDW: 12.5 % (ref 11.5–15.5)
WBC: 15.7 10*3/uL — ABNORMAL HIGH (ref 4.0–10.5)
nRBC: 0 % (ref 0.0–0.2)

## 2024-03-07 LAB — COMPREHENSIVE METABOLIC PANEL WITH GFR
ALT: 16 U/L (ref 0–44)
AST: 24 U/L (ref 15–41)
Albumin: 3.1 g/dL — ABNORMAL LOW (ref 3.5–5.0)
Alkaline Phosphatase: 58 U/L (ref 38–126)
Anion gap: 9 (ref 5–15)
BUN: 11 mg/dL (ref 8–23)
CO2: 23 mmol/L (ref 22–32)
Calcium: 8.8 mg/dL — ABNORMAL LOW (ref 8.9–10.3)
Chloride: 105 mmol/L (ref 98–111)
Creatinine, Ser: 0.59 mg/dL (ref 0.44–1.00)
GFR, Estimated: >60 mL/min (ref >60)
Glucose, Bld: 162 mg/dL — ABNORMAL HIGH (ref 70–99)
Potassium: 3.9 mmol/L (ref 3.5–5.1)
Sodium: 137 mmol/L (ref 135–145)
Total Bilirubin: 0.4 mg/dL (ref 0.0–1.2)
Total Protein: 6.4 g/dL — ABNORMAL LOW (ref 6.5–8.1)

## 2024-03-07 LAB — MAGNESIUM: Magnesium: 1.8 mg/dL (ref 1.7–2.4)

## 2024-03-07 MED ORDER — OXYCODONE HCL 5 MG PO TABS: 5.0000 mg | ORAL_TABLET | ORAL | Status: DC | PRN

## 2024-03-07 MED ORDER — ACETAMINOPHEN 325 MG PO TABS
650.0000 mg | ORAL_TABLET | Freq: Four times a day (QID) | ORAL | Status: DC
  Administered 2024-03-07: 650 mg via ORAL
  Filled 2024-03-07: qty 2

## 2024-03-07 MED ORDER — OXYCODONE-ACETAMINOPHEN 5-325 MG PO TABS: 1.0000 | ORAL_TABLET | Freq: Three times a day (TID) | ORAL | 0 refills | Status: AC | PRN

## 2024-03-07 MED ORDER — METHOCARBAMOL 500 MG PO TABS
500.0000 mg | ORAL_TABLET | Freq: Three times a day (TID) | ORAL | Status: DC
  Administered 2024-03-07 (×2): 500 mg via ORAL
  Filled 2024-03-07 (×2): qty 1

## 2024-03-07 MED ORDER — ESCITALOPRAM OXALATE 10 MG PO TABS
20.0000 mg | ORAL_TABLET | Freq: Every day | ORAL | Status: DC
  Administered 2024-03-07: 20 mg via ORAL
  Filled 2024-03-07: qty 2

## 2024-03-07 MED ORDER — METHOCARBAMOL 500 MG PO TABS: 500.0000 mg | ORAL_TABLET | Freq: Two times a day (BID) | ORAL | 0 refills | Status: DC

## 2024-03-07 MED ORDER — AMLODIPINE BESYLATE 5 MG PO TABS
2.5000 mg | ORAL_TABLET | Freq: Every day | ORAL | Status: DC
  Administered 2024-03-07: 2.5 mg via ORAL
  Filled 2024-03-07: qty 1

## 2024-03-07 MED ORDER — IBUPROFEN 400 MG PO TABS
600.0000 mg | ORAL_TABLET | Freq: Three times a day (TID) | ORAL | Status: DC
  Administered 2024-03-07: 600 mg via ORAL
  Filled 2024-03-07: qty 1

## 2024-03-07 NOTE — Progress Notes (Signed)
    Durable Medical Equipment  (From admission, onward)           Start     Ordered   03/07/24 0000  For home use only DME Cane        03/07/24 1026   03/07/24 0000  DME Bedside commode       Question:  Patient needs a bedside commode to treat with the following condition  Answer:  Fracture   03/07/24 1033           Pt is confined to a room with no toilet due to fracture; will require bedside commode.

## 2024-03-07 NOTE — Discharge Planning (Signed)
    Durable Medical Equipment  (From admission, onward)           Start     Ordered   03/07/24 0000  For home use only DME Cane        03/07/24 1026   03/07/24 0000  DME Bedside commode       Question:  Patient needs a bedside commode to treat with the following condition  Answer:  Fracture   03/07/24 1033

## 2024-03-07 NOTE — Evaluation (Signed)
 Physical Therapy Evaluation Patient Details Name: Cynthia Berry MRN: [id number] DOB: 11-13-1945 Today's Date: 03/07/2024  History of Present Illness  Pt is 78 year old presented to [facility] on  03/06/24 for fall due to trying to help a friend with poor balance. Pt with rt humeral head/neck fx. PMH - HTn, gout, anemia, osteopenia  Clinical Impression  Pt with decr in mobility s/p fall with rt shoulder fx. Typically pt is very independent and only fell due to trying to prevent a friend from falling. Pt moving adequately to return home with several days of transitional help from family as well as HHPT and a straight cane.         If plan is discharge home, recommend the following: Help with stairs or ramp for entrance;Assist for transportation;Assistance with cooking/housework;A little help with bathing/dressing/bathroom   Can travel by private vehicle        Equipment Recommendations Cane  Recommendations for Other Services  OT consult    Functional Status Assessment Patient has had a recent decline in their functional status and demonstrates the ability to make significant improvements in function in a reasonable and predictable amount of time.     Precautions / Restrictions Precautions Precautions: Shoulder;Fall Required Braces or Orthoses: Sling Restrictions Other Position/Activity Restrictions: Assume NWB to RUE      Mobility  Bed Mobility Overal bed mobility: Needs Assistance Bed Mobility: Supine to Sit     Supine to sit: Min assist     General bed mobility comments: Assist to elevate trunk into sitting    Transfers Overall transfer level: Needs assistance Equipment used: None Transfers: Sit to/from Stand Sit to Stand: Supervision           General transfer comment: for safety    Ambulation/Gait Ambulation/Gait assistance: Contact guard assist, Supervision Gait Distance (Feet): 150 Feet Assistive device: Straight cane Gait Pattern/deviations: Step-through  pattern, Decreased stride length Gait velocity: decr Gait velocity interpretation: 1.31 - 2.62 ft/sec, indicative of limited community ambulator   General Gait Details: Assist for safety. Incr stability as distance increased  Careers information officer     Tilt Bed    Modified Rankin (Stroke Patients Only)       Balance Overall balance assessment: Needs assistance Sitting-balance support: No upper extremity supported Sitting balance-Leahy Scale: Good     Standing balance support: No upper extremity supported, During functional activity Standing balance-Leahy Scale: Good                               Pertinent Vitals/Pain Pain Assessment Pain Assessment: Faces Faces Pain Scale: Hurts even more Pain Location: rt shoulder Pain Descriptors / Indicators: Grimacing, Guarding Pain Intervention(s): Limited activity within patient's tolerance, Monitored during session, Patient requesting pain meds-RN notified    Home Living Family/patient expects to be discharged to:: Private residence Living Arrangements: Alone Available Help at Discharge: Family;Available 24 hours/day (Family can be present for a couple of days to transition home) Type of Home: House Home Access: Stairs to enter Entrance Stairs-Rails: Left Entrance Stairs-Number of Steps: 4   Home Layout: One level Home Equipment: None      Prior Function Prior Level of Function : Independent/Modified Independent;Driving             Mobility Comments: no assistive device       Extremity/Trunk Assessment   Upper Extremity Assessment Upper Extremity  Assessment: Defer to OT evaluation    Lower Extremity Assessment Lower Extremity Assessment: Overall WFL for tasks assessed       Communication   Communication Communication: No apparent difficulties    Cognition Arousal: Alert Behavior During Therapy: WFL for tasks assessed/performed   PT - Cognitive impairments: No  apparent impairments                         Following commands: Intact       Cueing Cueing Techniques: Verbal cues     General Comments      Exercises     Assessment/Plan    PT Assessment All further PT needs can be met in the next venue of care  PT Problem List Decreased mobility;Decreased balance;Decreased activity tolerance;Pain       PT Treatment Interventions      PT Goals (Current goals can be found in the Care Plan section)  Acute Rehab PT Goals Patient Stated Goal: return home PT Goal Formulation: All assessment and education complete, DC therapy    Frequency       Co-evaluation               AM-PAC PT "6 Clicks" Mobility  Outcome Measure Help needed turning from your back to your side while in a flat bed without using bedrails?: None Help needed moving from lying on your back to sitting on the side of a flat bed without using bedrails?: A Little Help needed moving to and from a bed to a chair (including a wheelchair)?: A Little Help needed standing up from a chair using your arms (e.g., wheelchair or bedside chair)?: A Little Help needed to walk in hospital room?: A Little Help needed climbing 3-5 steps with a railing? : A Little 6 Click Score: 19    End of Session Equipment Utilized During Treatment: Other (comment) (rt sling) Activity Tolerance: Patient tolerated treatment well Patient left: in chair;with call bell/phone within reach;with family/visitor present Nurse Communication: Mobility status;Patient requests pain meds PT Visit Diagnosis: Other abnormalities of gait and mobility (R26.89)    Time: 4098-1191 PT Time Calculation (min) (ACUTE ONLY): 25 min   Charges:   PT Evaluation $PT Eval Low Complexity: 1 Low PT Treatments $Gait Training: 8-22 mins PT General Charges $$ ACUTE PT VISIT: 1 Visit         [facility] PT Acute Rehabilitation Services Office [phone]   Pura Browns [facility] 03/07/2024, 9:54 AM

## 2024-03-07 NOTE — Evaluation (Signed)
 Occupational Therapy Evaluation Patient Details Name: Cynthia Berry MRN: [id number] DOB: [birth year] Today's Date: 03/07/2024   History of Present Illness   Pt is 78 year old presented to [facility] on  03/06/24 for fall due to trying to help a friend with poor balance. Pt with rt humeral head/neck fx. PMH - HTn, gout, anemia, osteopenia     Clinical Impressions Pt is typically independent in ADL and functional transfers/mobility. Very social, drives. Pt today is limited by new shoulder fx. Educated on Shoulder precautions until Pt can follow up with orthopedic MD (handout provided): Educated patient on don doff brace with sling,washing under arms with cloth utilizing "dangle" method to comfort, avoid shoulder movement. Educated on positioning with pillows in chair for comfort and to come out of sling to give neck a break. Educated on sleeping positioning (recommend recliner if possible), pt educated on sequencing of  dressing including RUE in first for dressing and out last for de-robing.  Home exercise program for hand, wrist, elbow  as edema management and to tolerance for pain. Will defer further therapy to shoulder MD after consult. Education complete, recommending 3 in 1 for toilet transfers.      If plan is discharge home, recommend the following:   A little help with walking and/or transfers;A lot of help with bathing/dressing/bathroom;Assistance with cooking/housework;Assist for transportation;Help with stairs or ramp for entrance     Functional Status Assessment   Patient has had a recent decline in their functional status and demonstrates the ability to make significant improvements in function in a reasonable and predictable amount of time.     Equipment Recommendations   BSC/3in1     Recommendations for Other Services   PT consult     Precautions/Restrictions   Precautions Precautions: Shoulder;Fall Type of Shoulder Precautions: conservative - no ortho consult  yet Shoulder Interventions: Shoulder sling/immobilizer;At all times;Off for dressing/bathing/exercises Precaution Booklet Issued: Yes (comment) Recall of Precautions/Restrictions: Intact Required Braces or Orthoses: Sling Restrictions Weight Bearing Restrictions Per Provider Order: Yes RUE Weight Bearing Per Provider Order: Non weight bearing Other Position/Activity Restrictions: Assume NWB to RUE     Mobility Bed Mobility Overal bed mobility: Needs Assistance Bed Mobility: Supine to Sit     Supine to sit: Min assist     General bed mobility comments: Assist to elevate trunk into sitting    Transfers Overall transfer level: Needs assistance Equipment used: None Transfers: Sit to/from Stand Sit to Stand: Supervision           General transfer comment: for safety      Balance Overall balance assessment: Needs assistance Sitting-balance support: No upper extremity supported Sitting balance-Leahy Scale: Good     Standing balance support: No upper extremity supported, During functional activity Standing balance-Leahy Scale: Good                             ADL either performed or assessed with clinical judgement   ADL Overall ADL's : Needs assistance/impaired                                       General ADL Comments: see shoulder section below     Vision Baseline Vision/History: 1 Wears glasses Patient Visual Report: No change from baseline       Perception         Praxis  Pertinent Vitals/Pain Pain Assessment Pain Assessment: Faces Faces Pain Scale: Hurts even more Pain Location: rt shoulder Pain Descriptors / Indicators: Grimacing, Guarding Pain Intervention(s): Monitored during session, Repositioned (sling)     Extremity/Trunk Assessment Upper Extremity Assessment Upper Extremity Assessment: Right hand dominant;RUE deficits/detail RUE Deficits / Details: pending ortho consult, in sling, R handed, educated  NWB, basic hand/wrist/elbow exercises RUE: Unable to fully assess due to immobilization;Shoulder pain at rest RUE Sensation: WNL RUE Coordination: decreased gross motor   Lower Extremity Assessment Lower Extremity Assessment: Defer to PT evaluation   Cervical / Trunk Assessment Cervical / Trunk Assessment: Normal   Communication Communication Communication: No apparent difficulties   Cognition Arousal: Alert Behavior During Therapy: WFL for tasks assessed/performed                                 Following commands: Intact       Cueing  General Comments   Cueing Techniques: Verbal cues      Exercises Exercises: Shoulder Shoulder Exercises Elbow Flexion: AAROM, Self ROM, Right (for comfort) Elbow Extension: AAROM, Self ROM, Right (to comfort) Wrist Flexion: AROM, Right Wrist Extension: AROM, Right Digit Composite Flexion: AROM, Right Composite Extension: AROM, Right Neck Flexion: AROM Neck Extension: AROM Neck Lateral Flexion - Right: AROM Neck Lateral Flexion - Left: AROM   Shoulder Instructions Shoulder Instructions Donning/doffing shirt without moving shoulder: Maximal assistance Method for sponge bathing under operated UE: Min-guard Donning/doffing sling/immobilizer: Maximal assistance Correct positioning of sling/immobilizer: Maximal assistance Pendulum exercises (written home exercise program):  (n/a) ROM for elbow, wrist and digits of operated UE: Min-guard Sling wearing schedule (on at all times/off for ADL's): Independent Proper positioning of operated UE when showering: Supervision/safety Dressing change:  (n/a) Positioning of UE while sleeping: Min-guard    Home Living Family/patient expects to be discharged to:: Private residence Living Arrangements: Alone Available Help at Discharge: Family;Available 24 hours/day (Family can be present for a couple of days to transition home) Type of Home: House Home Access: Stairs to  enter [employer] of Steps: 4 Entrance Stairs-Rails: Left Home Layout: One level     Bathroom Shower/Tub: Chief Strategy Officer: Standard     Home Equipment: None          Prior Functioning/Environment Prior Level of Function : Independent/Modified Independent;Driving             Mobility Comments: no assistive device ADLs Comments: independent    OT Problem List: Decreased range of motion;Impaired balance (sitting and/or standing);Decreased knowledge of use of DME or AE;Impaired UE functional use;Pain;Increased edema   OT Treatment/Interventions:        OT Goals(Current goals can be found in the care plan section)   Acute Rehab OT Goals Patient Stated Goal: get back to independent OT Goal Formulation: With patient/family Time For Goal Achievement: 03/21/24 Potential to Achieve Goals: Good   OT Frequency:       Co-evaluation              AM-PAC OT "6 Clicks" Daily Activity     Outcome Measure Help from another person eating meals?: A Little Help from another person taking care of personal grooming?: A Little Help from another person toileting, which includes using toliet, bedpan, or urinal?: A Little Help from another person bathing (including washing, rinsing, drying)?: A Lot Help from another person to put on and taking off regular upper body clothing?: A Lot  Help from another person to put on and taking off regular lower body clothing?: A Lot 6 Click Score: 15   End of Session Equipment Utilized During Treatment: Other (comment) (sling) Nurse Communication: Mobility status;Weight bearing status  Activity Tolerance: Patient tolerated treatment well Patient left: in chair;with family/visitor present  OT Visit Diagnosis: Other abnormalities of gait and mobility (R26.89);Pain;Muscle weakness (generalized) (M62.81);History of falling (Z91.81) Pain - Right/Left: Right Pain - part of body: Shoulder                Time:  1478-2956 OT Time Calculation (min): 37 min Charges:  OT General Charges $OT Visit: 1 Visit OT Evaluation $OT Eval Low Complexity: 1 Low  [place] OTR/L Acute Rehabilitation Services Office: [phone]  Ebony Goldstein [facility] 03/07/2024, 11:25 AM

## 2024-03-07 NOTE — Progress Notes (Signed)
 Consult received for DME / home health needs.  Therapy team has signed in and will evaluate patient.   TOC to follow for discharge planning needs.  Shepard Dicker, MSW, LCSW Transitions of Care  Clinical Social Worker II [phone]

## 2024-03-07 NOTE — Discharge Instructions (Addendum)
 Call the telephone number below to set up a follow-up appointment with the orthopedic surgery office.  Keep arm in sling.  Take ibuprofen and Tylenol  for pain.  A prescription for narcotic pain medication was sent to your pharmacy.  Take this only as needed.  You have 2 stitches on area of your right eyebrow.  These will need to be removed in 1 week.  If area becomes increasingly red, painful, swollen, or begins draining pus, please return to the emergency department.

## 2024-03-07 NOTE — Discharge Planning (Signed)
 RNCM followed up after PT recommendations.  RNCM spoke with pt at bedside regarding discharge planning for Home Health Services. Offered pt [email or web address] list of home health agencies to choose from.  Pt chose [facility] to render PTservices. [facility] notified. Patient made aware that Gasper Karst will be in contact in 24-48 hours.   DME needs identified at this time include cane.  RNCM provided cane at bedside prior to discharge home.

## 2024-03-07 NOTE — ED Provider Notes (Signed)
 Patient was seen by Dr. Kermit Ped last evening following a fall.  Patient found to have nondisplaced nasal bone fractures.  She was also found to have an acute impacted and displaced fracture of the right humeral head and neck.  There was concerns about the patient going home so patient is currently holding in the ED for possible rehab placement.   Pt seen by PT.  Pt able to ambulate.  Will dc home.  Orders for PT, bedside commode, cane   Trish Furl, MD 03/07/24 [phone]

## 2024-03-07 NOTE — Discharge Planning (Signed)
 [name], BSN, RN, [place] [phone] Pt qualifies for DME [facility]) 3-n-1 and cane.  DME  ordered through [employer].  Mitch of [employer] notified to deliver DME to pt room prior to D/C home.

## 2024-03-07 NOTE — Progress Notes (Signed)
 Transition of Care [facility]) - Emergency Department Mini Assessment   Patient Details  Name: Cynthia Berry MRN: [id number] Date of Birth: 07-Nov-1945  [facility] CM/SW Contact:    [name], RN Phone Number: 03/07/2024, 7:52 AM   Clinical Narrative: RNCM consulted regarding discharge planning for home health.  RNCM met with pt at bedside to discuss her goals and discharge plan.  Pt states that she lives alone and has not gotten out of bed since the fall.  She would like to see how strong she is to go home alone.  RNCM will continue to follow and return to discuss after PT evaluation.   ED Mini Assessment: What brought you to the Emergency Department? : (P) "My friend was falling and I was trying to help  Barriers to Discharge: (P) Other (must enter comment) (PT/OT consult)  Barrier interventions: (P) Awaiting PT recommendations  Means of departure: (P) Not know  Interventions which prevented an admission or readmission: (P) Home Health Consult or Services, SNF Placement    Patient Contact and Communications     Spoke with: (P) patient at bedside  ,          Patient states their goals for this hospitalization and ongoing recovery are:: (P) "Be safe at home.  I live by myself but would like to wait until I get up with PT to decide."      Admission diagnosis:  fall Patient Active Problem List   Diagnosis Date Noted   Osteopenia 03/27/2019   Morbid obesity (HCC) 04/19/2017   Hx of adenomatous polyp of colon 04/30/2016   Allergic rhinitis 12/28/2014   Gout 12/28/2014   Hyperglycemia 08/31/2013   Hyperlipidemia 09/30/2009   Depression 05/22/2007   Essential hypertension 05/22/2007   PCP:  Almira Jaeger, MD Pharmacy:   [phone] [place], [place] - [address] AT [address]NER OF [facility] [address]  [place] [address] Phone: [phone] Fax: [phone]  [id number] [address] [place] - [address] [place] [address] Phone: [phone] Fax: [phone]  [facility] [place], [place] - [address] [place] [address] Phone: [phone] Fax: [phone]  [facility] [address] [place] [address] Phone: [phone] Fax: [phone]

## 2024-03-09 ENCOUNTER — Telehealth: Payer: Self-pay | Admitting: Family Medicine

## 2024-03-09 NOTE — Telephone Encounter (Signed)
 Please see msg and advise if ok to complete paperwork for patient or needing to schedule OV

## 2024-03-09 NOTE — Telephone Encounter (Signed)
 66

## 2024-03-09 NOTE — Telephone Encounter (Signed)
 Received faxed document Surgical Clearance, to be filled out by provider. Patient requested to send it back via Fax . Document is located in providers tray at front office. ** Surgical date is 03/14/24 . Attempted to schedule for an ov for review  but pt Patient states she recently had labwork done at hospital . Please advise

## 2024-03-09 NOTE — Telephone Encounter (Signed)
 We saw her last July and recommended 67-month follow-up-she is overdue and we have not recently evaluated for cardiac symptoms so I agree with visit

## 2024-03-12 ENCOUNTER — Ambulatory Visit (INDEPENDENT_AMBULATORY_CARE_PROVIDER_SITE_OTHER): Admitting: Family Medicine

## 2024-03-12 ENCOUNTER — Telehealth: Payer: Self-pay

## 2024-03-12 ENCOUNTER — Encounter: Payer: Self-pay | Admitting: Family Medicine

## 2024-03-12 VITALS — BP 132/82 | HR 93 | Temp 97.7°F | Ht 61.0 in | Wt 214.8 lb

## 2024-03-12 DIAGNOSIS — E785 Hyperlipidemia, unspecified: Secondary | ICD-10-CM

## 2024-03-12 DIAGNOSIS — I10 Essential (primary) hypertension: Secondary | ICD-10-CM | POA: Diagnosis not present

## 2024-03-12 DIAGNOSIS — F325 Major depressive disorder, single episode, in full remission: Secondary | ICD-10-CM | POA: Diagnosis not present

## 2024-03-12 NOTE — Telephone Encounter (Signed)
Please schedule ov for pt.

## 2024-03-12 NOTE — Telephone Encounter (Signed)
 FYI, see below. You wanted pt to come in prior to surgery.  Copied from CRM [id number]. Topic: Clinical - Medication Question >> Mar 12, 2024  9:54 AM Keitha Pata L wrote: Reason for CRM: patient daughter is calling to get a temp handicap decal so that she can take her mother to surgery. Patient has not been in and can't come in due to current condition and wants a call back in reference to getting a temp decal. Also she has  stitches that aren't dissolvable and may need them removed as well .... Daughter Odilia Bennett cb# [phone]

## 2024-03-12 NOTE — Telephone Encounter (Signed)
 Lets load this up for the time of visit

## 2024-03-12 NOTE — Progress Notes (Signed)
 Phone [phone] In person visit   Subjective:   Cynthia Berry is a 78 y.o. year old very pleasant female patient who presents for/with See problem oriented charting Chief Complaint  Patient presents with   surgery clearance   Past Medical History-  Patient Active Problem List   Diagnosis Date Noted   Osteopenia 03/27/2019    Priority: Medium    Hx of adenomatous polyp of colon 04/30/2016    Priority: Medium    Gout 12/28/2014    Priority: Medium    Hyperlipidemia 09/30/2009    Priority: Medium    Depression 05/22/2007    Priority: Medium    Essential hypertension 05/22/2007    Priority: Medium    Allergic rhinitis 12/28/2014    Priority: Low   Hyperglycemia 08/31/2013    Priority: Low   Morbid obesity (HCC) 04/19/2017    Medications- reviewed and updated Current Outpatient Medications  Medication Sig Dispense Refill   acetaminophen  (TYLENOL ) 500 MG tablet Take 500-1,000 mg by mouth every 6 (six) hours as needed for moderate pain (pain score 4-6).     amLODipine  (NORVASC ) 2.5 MG tablet TAKE 1 TABLET BY MOUTH EVERY DAY 90 tablet 3   Ascorbic Acid (VITAMIN C) 1000 MG tablet Take 1,000 mg by mouth daily.     Cholecalciferol (VITAMIN D) 50 MCG (2000 UT) CAPS Take 2,000 Units by mouth daily.     escitalopram  (LEXAPRO ) 20 MG tablet Take 1 tablet (20 mg total) by mouth daily. 90 tablet 3   loperamide (IMODIUM A-D) 2 MG tablet Take 2-4 mg by mouth 4 (four) times daily as needed for diarrhea or loose stools.     rosuvastatin  (CRESTOR ) 5 MG tablet TAKE 1 TABLET (5 MG TOTAL) BY MOUTH DAILY. (Patient taking differently: Take 5 mg by mouth at bedtime.) 90 tablet 3   ALPRAZolam  (XANAX ) 0.25 MG tablet Do not drive for 8 hours after use. take 1 tablet by mouth twice a day if needed (Patient not taking: Reported on 03/12/2024) 40 tablet 0   methocarbamol  (ROBAXIN ) 500 MG tablet Take 1 tablet (500 mg total) by mouth 2 (two) times daily. (Patient not taking: Reported on 03/12/2024) 20 tablet 0    oxyCODONE -acetaminophen  (PERCOCET/ROXICET) 5-325 MG tablet Take 1 tablet by mouth every 8 (eight) hours as needed for up to 5 days for severe pain (pain score 7-10). (Patient not taking: Reported on 03/12/2024) 15 tablet 0   No current facility-administered medications for this visit.     Objective:  BP 132/82   Pulse 93   Temp 97.7 F (36.5 C)   Ht 5\' 1"  (1.549 m)   Wt 214 lb 12.8 oz (97.4 kg)   LMP  (LMP Unknown)   SpO2 95%   BMI 40.59 kg/m  Gen: NAD, resting comfortably CV: RRR no murmurs rubs or gallops Lungs: CTAB no crackles, wheeze, rhonchi Ext: trace edema Skin: warm, dry Neuro: walking with cane today- for stability with her right shoulder    Assessment and Plan   # Preoperative evaluation-patient with upcoming surgery on Thursday this week for shoulder replacement-total reverse with Dr. Alfredo Ano.  Fracture occurred after fall on 03/06/2024-patient's friend she was trying to support but friend had poor balance and fell onto her causing the patient to fall to the ground striking her face and right arm-right arm was pinned underneath her.  Did not lose consciousness.  Hit periorbital area of the face -in retrospect does believe she lost consciousness- thankfully largely reassuring CT head, maxillofacial and  cervical spine other than nondisplaced nasal fracture. Some very short term headache(s) but nothing persistent -Labs in the emergency department were largely reassuring-other than calcium , protein, albumin very mildly low-patient reports low intake lately- I think yield of repeat is low  -Her white blood cells were also elevated as were neutrophils with left shift-suspect this was stress demargination related with fractures and fall -She also sustained facial fractures-with nondisplaced bilateral nasal bone fractures - plan is for home after surgery.  -able to walk up an incline at brisk pace without chest pain or shortness of breath. Does stairs at home but only 4 without  chest pain or shortness of breath- would not need further cardiac clearance - EKG update today was discussed but would be extremely challenging with patient discomfort with shoulder to disrobe from her dress and we ultimately opted out after joint discussion- might be possible with anesthesia if wears different clothing - labs with anesthesia on Wednesday -we removed 2 sutures near right eye today without difficulty- good wound healing -handicap placard written Not a fragility fracture but consder updating bone density next visit regardless with known osteopenia  #hypertension/history of gout on diuretic-no issues off S: medication: Amlodipine  2.5 mg daily BP Readings from Last 3 Encounters:  03/12/24 132/82  03/07/24 107/62  09/30/23 136/85    A/P: well controlled continue current medications   #hyperlipidemia S: Medication:Rosuvastatin  5 mg daily -Prior simvastatin  but she preferred not to take this along with amlodipine   Lab Results  Component Value Date   CHOL 152 05/09/2023   HDL 56.80 05/09/2023   LDLCALC 79 05/09/2023   LDLDIRECT 104.0 05/16/2018   TRIG 83.0 05/09/2023   CHOLHDL 3 05/09/2023    A/P: reasonable control and close to ideal goal 70 or less for Low Density Lipoprotein (LDL cholesterol)- continue current medications     # Depression S: Medication:Escitalopram  20 mg - Situational alprazolam      03/12/2024    1:22 PM 09/30/2023    9:02 AM 08/02/2023    2:33 PM  Depression screen PHQ 2/9  Decreased Interest 0 0 0  Down, Depressed, Hopeless 0 0 0  PHQ - 2 Score 0 0 0  Altered sleeping 0 0   Tired, decreased energy 0 0   Change in appetite 0 0   Feeling bad or failure about yourself  0 0   Trouble concentrating 0 0   Moving slowly or fidgety/restless 0 0   Suicidal thoughts 0 0   PHQ-9 Score 0 0   Difficult doing work/chores Not difficult at all Not difficult at all   A/P: full remission- continue current medications     # Hyperglycemia/insulin  resistance/prediabetes-A1c at least 5.7 in the past - we agreed to repeat a1c at 6 moth follow up   Recommended follow up: Return in about 6 months (around 09/11/2024) for followup or sooner if needed.Schedule b4 you leave. Future Appointments  Date Time Provider Department Center  03/14/2024 11:00 AM [facility] PAT 4 [facility] None  08/07/2024  3:00 PM [facility] ANNUAL WELLNESS VISIT 1 [facility] PEC    Lab/Order associations:   ICD-10-CM   1. Essential hypertension  I10 EKG 12-Lead      No orders of the defined types were placed in this encounter.   Return precautions advised.  Clarisa Crooked, MD

## 2024-03-12 NOTE — Telephone Encounter (Signed)
 Spoke with pt and she will need a sooner appointment for pre-op clearance. She states it is an emergency because she fell and has a closed displaced fracture of proximal end of right humerus. Surgery is on 03/15/24 and Dr Arlene Ben does not have any open appts until Thursday. Please advise.

## 2024-03-12 NOTE — Patient Instructions (Addendum)
 We will sign off on forms for surgery. I wish you the best with surgery!   Safe to take the methocarbamol    Recommended follow up: Return in about 6 months (around 09/11/2024) for followup or sooner if needed.Schedule b4 you leave.

## 2024-03-13 NOTE — Patient Instructions (Signed)
 SURGICAL WAITING ROOM VISITATION  Patients having surgery or a procedure may have no more than 2 support people in the waiting area - these visitors may rotate.    Children under the age of 50 must have an adult with them who is not the patient.  Due to an increase in RSV and influenza rates and associated hospitalizations, children ages 56 and under may not visit patients in [facility] hospitals.  Visitors with respiratory illnesses are discouraged from visiting and should remain at home.  If the patient needs to stay at the hospital during part of their recovery, the visitor guidelines for inpatient rooms apply. Pre-op nurse will coordinate an appropriate time for 1 support person to accompany patient in pre-op.  This support person may not rotate.    Please refer to the [facility] website for the visitor guidelines for Inpatients (after your surgery is over and you are in a regular room).    Your procedure is scheduled on: 03/15/24   Report to [facility] Main Entrance    Report to admitting at 7:20 AM   Call this number if you have problems the morning of surgery [phone]   Do not eat food :After Midnight.   After Midnight you may have the following liquids until 6:50 AM DAY OF SURGERY  Water Non-Citrus Juices (without pulp, NO RED-Apple, White grape, White cranberry) Black Coffee (NO MILK/CREAM OR CREAMERS, sugar ok)  Clear Tea (NO MILK/CREAM OR CREAMERS, sugar ok) regular and decaf                             Plain Jell-O (NO RED)                                           Fruit ices (not with fruit pulp, NO RED)                                     Popsicles (NO RED)                                                               Sports drinks like Gatorade (NO RED)               The day of surgery:  Drink ONE (1) Pre-Surgery Clear Ensure at 6:50 AM the morning of surgery. Drink in one sitting. Do not sip.  This drink was given to you during your hospital  pre-op  appointment visit. Nothing else to drink after completing the  Pre-Surgery Clear Ensure or G2.          If you have questions, please contact your surgeon's office.   FOLLOW BOWEL PREP AND ANY ADDITIONAL PRE OP INSTRUCTIONS YOU RECEIVED FROM YOUR SURGEON'S OFFICE!!!     Oral Hygiene is also important to reduce your risk of infection.                                    Remember -  BRUSH YOUR TEETH THE MORNING OF SURGERY WITH YOUR REGULAR TOOTHPASTE  DENTURES WILL BE REMOVED PRIOR TO SURGERY PLEASE DO NOT APPLY "Poly grip" OR ADHESIVES!!!   Stop all vitamins and herbal supplements 7 days before surgery.   Take these medicines the morning of surgery with A SIP OF WATER: Tylenol , Amlodipine , Lexapro                                You may not have any metal on your body including hair pins, jewelry, and body piercing             Do not wear make-up, lotions, powders, perfumes, or deodorant  Do not wear nail polish including gel and S&S, artificial/acrylic nails, or any other type of covering on natural nails including finger and toenails. If you have artificial nails, gel coating, etc. that needs to be removed by a nail salon please have this removed prior to surgery or surgery may need to be canceled/ delayed if the surgeon/ anesthesia feels like they are unable to be safely monitored.   Do not shave  48 hours prior to surgery.    Do not bring valuables to the hospital. [facility] IS NOT             RESPONSIBLE   FOR VALUABLES.   Contacts, glasses, dentures or bridgework may not be worn into surgery.  DO NOT BRING YOUR HOME MEDICATIONS TO THE HOSPITAL. PHARMACY WILL DISPENSE MEDICATIONS LISTED ON YOUR MEDICATION LIST TO YOU DURING YOUR ADMISSION IN THE HOSPITAL!    Patients discharged on the day of surgery will not be allowed to drive home.  Someone NEEDS to stay with you for the first 24 hours after anesthesia.              Please read over the following fact sheets you were given:  IF YOU HAVE QUESTIONS ABOUT YOUR PRE-OP INSTRUCTIONS PLEASE CALL [phone] Kayleen Party    If you received a COVID test during your pre-op visit  it is requested that you wear a mask when out in public, stay away from anyone that may not be feeling well and notify your surgeon if you develop symptoms. If you test positive for Covid or have been in contact with anyone that has tested positive in the last 10 days please notify you surgeon.    [place] - Preparing for Surgery Before surgery, you can play an important role.  Because skin is not sterile, your skin needs to be as free of germs as possible.  You can reduce the number of germs on your skin by washing with CHG (chlorahexidine gluconate) soap before surgery.  CHG is an antiseptic cleaner which kills germs and bonds with the skin to continue killing germs even after washing. Please DO NOT use if you have an allergy to CHG or antibacterial soaps.  If your skin becomes reddened/irritated stop using the CHG and inform your nurse when you arrive at Short Stay. Do not shave (including legs and underarms) for at least 48 hours prior to the first CHG shower.  You may shave your face/neck.  Please follow these instructions carefully:  1.  Shower with CHG Soap the night before surgery and the  morning of surgery.  2.  If you choose to wash your hair, wash your hair first as usual with your normal  shampoo.  3.  After you shampoo, rinse your hair and body  thoroughly to remove the shampoo.                             4.  Use CHG as you would any other liquid soap.  You can apply chg directly to the skin and wash.  Gently with a scrungie or clean washcloth.  5.  Apply the CHG Soap to your body ONLY FROM THE NECK DOWN.   Do   not use on face/ open                           Wound or open sores. Avoid contact with eyes, ears mouth and   genitals (private parts).                       Wash face,  Genitals (private parts) with your normal soap.              6.  Wash thoroughly, paying special attention to the area where your    surgery  will be performed.  7.  Thoroughly rinse your body with warm water from the neck down.  8.  DO NOT shower/wash with your normal soap after using and rinsing off the CHG Soap.                9.  Pat yourself dry with a clean towel.            10.  Wear clean pajamas.            11.  Place clean sheets on your bed the night of your first shower and do not  sleep with pets. Day of Surgery : Do not apply any lotions/deodorants the morning of surgery.  Please wear clean clothes to the hospital/surgery center.  FAILURE TO FOLLOW THESE INSTRUCTIONS MAY RESULT IN THE CANCELLATION OF YOUR SURGERY  PATIENT SIGNATURE_________________________________  NURSE SIGNATURE__________________________________  [place]- Preparing for Total Shoulder Arthroplasty    Before surgery, you can play an important role. Because skin is not sterile, your skin needs to be as free of germs as possible. You can reduce the number of germs on your skin by using the following products. Benzoyl Peroxide Gel Reduces the number of germs present on the skin Applied twice a day to shoulder area starting two days before surgery    ==================================================================  Please follow these instructions carefully:  BENZOYL PEROXIDE 5% GEL  Please do not use if you have an allergy to benzoyl peroxide.   If your skin becomes reddened/irritated stop using the benzoyl peroxide.  Starting two days before surgery, apply as follows: Apply benzoyl peroxide in the morning and at night. Apply after taking a shower. If you are not taking a shower clean entire shoulder front, back, and side along with the armpit with a clean wet washcloth.  Place a quarter-sized dollop on your shoulder and rub in thoroughly, making sure to cover the front, back, and side of your shoulder, along with the armpit.   2 days before ____ AM   ____  PM              1 day before ____ AM   ____ PM                         Do this twice a day for two days.  (Last application is the  night before surgery, AFTER using the CHG soap as described below).  Do NOT apply benzoyl peroxide gel on the day of surgery.  ________________________________________________________________________  Benjamen Brand  An incentive spirometer is a tool that can help keep your lungs clear and active. This tool measures how well you are filling your lungs with each breath. Taking long deep breaths may help reverse or decrease the chance of developing breathing (pulmonary) problems (especially infection) following: A long period of time when you are unable to move or be active. BEFORE THE PROCEDURE  If the spirometer includes an indicator to show your best effort, your nurse or respiratory therapist will set it to a desired goal. If possible, sit up straight or lean slightly forward. Try not to slouch. Hold the incentive spirometer in an upright position. INSTRUCTIONS FOR USE  Sit on the edge of your bed if possible, or sit up as far as you can in bed or on a chair. Hold the incentive spirometer in an upright position. Breathe out normally. Place the mouthpiece in your mouth and seal your lips tightly around it. Breathe in slowly and as deeply as possible, raising the piston or the ball toward the top of the column. Hold your breath for 3-5 seconds or for as long as possible. Allow the piston or ball to fall to the bottom of the column. Remove the mouthpiece from your mouth and breathe out normally. Rest for a few seconds and repeat Steps 1 through 7 at least 10 times every 1-2 hours when you are awake. Take your time and take a few normal breaths between deep breaths. The spirometer may include an indicator to show your best effort. Use the indicator as a goal to work toward during each repetition. After each set of 10 deep breaths, practice coughing to be  sure your lungs are clear. If you have an incision (the cut made at the time of surgery), support your incision when coughing by placing a pillow or rolled up towels firmly against it. Once you are able to get out of bed, walk around indoors and cough well. You may stop using the incentive spirometer when instructed by your caregiver.  RISKS AND COMPLICATIONS Take your time so you do not get dizzy or light-headed. If you are in pain, you may need to take or ask for pain medication before doing incentive spirometry. It is harder to take a deep breath if you are having pain. AFTER USE Rest and breathe slowly and easily. It can be helpful to keep track of a log of your progress. Your caregiver can provide you with a simple table to help with this. If you are using the spirometer at home, follow these instructions: SEEK MEDICAL CARE IF:  You are having difficultly using the spirometer. You have trouble using the spirometer as often as instructed. Your pain medication is not giving enough relief while using the spirometer. You develop fever of 100.5 F (38.1 C) or higher. SEEK IMMEDIATE MEDICAL CARE IF:  You cough up bloody sputum that had not been present before. You develop fever of 102 F (38.9 C) or greater. You develop worsening pain at or near the incision site. MAKE SURE YOU:  Understand these instructions. Will watch your condition. Will get help right away if you are not doing well or get worse. Document Released: 02/07/2007 Document Revised: 12/20/2011 Document Reviewed: 04/10/2007 [facility] Patient Information 2014 [place], [place].   ________________________________________________________________________

## 2024-03-13 NOTE — Progress Notes (Signed)
 COVID Vaccine Completed: yes  Date of COVID positive in last 90 days:  PCP - Clarisa Crooked, MD Cardiologist -   Medical/cardiac clearance by Dr. Arlene Ben in media tab dated 03/13/24  Chest x-ray - 03/06/24 Epic EKG - need done DOS Stress Test - n/a ECHO - n/a Cardiac Cath - n/a Pacemaker/ICD device last checked: n/a Spinal Cord Stimulator: n/a  Bowel Prep - no  Sleep Study - n/a CPAP -   Fasting Blood Sugar - n/a Checks Blood Sugar _____ times a day  Last dose of GLP1 agonist-  N/A GLP1 instructions:  Hold 7 days before surgery    Last dose of SGLT-2 inhibitors-  N/A SGLT-2 instructions:  Hold 3 days before surgery    Blood Thinner Instructions:  Last dose: n/a  Time: Aspirin Instructions: Last Dose:  Activity level: Can go up a flight of stairs and perform activities of daily living without stopping and without symptoms of chest pain or shortness of breath.   Anesthesia review:   Patient denies shortness of breath, fever, cough and chest pain at PAT appointment  Patient verbalized understanding of instructions that were given to them at the PAT appointment. Patient was also instructed that they will need to review over the PAT instructions again at home before surgery.

## 2024-03-14 ENCOUNTER — Encounter (HOSPITAL_COMMUNITY): Payer: Self-pay

## 2024-03-14 ENCOUNTER — Encounter (HOSPITAL_COMMUNITY)
Admission: RE | Admit: 2024-03-14 | Discharge: 2024-03-14 | Disposition: A | Discharge status: Home / Self Care | Source: Ambulatory Visit | Attending: Orthopedic Surgery | Admitting: Orthopedic Surgery

## 2024-03-14 ENCOUNTER — Other Ambulatory Visit: Payer: Self-pay | Admitting: Family Medicine

## 2024-03-14 ENCOUNTER — Other Ambulatory Visit: Payer: Self-pay

## 2024-03-14 VITALS — HR 92 | Temp 97.9°F | Ht 61.0 in

## 2024-03-14 DIAGNOSIS — I10 Essential (primary) hypertension: Secondary | ICD-10-CM | POA: Insufficient documentation

## 2024-03-14 DIAGNOSIS — Z01818 Encounter for other preprocedural examination: Secondary | ICD-10-CM | POA: Insufficient documentation

## 2024-03-14 DIAGNOSIS — Z01812 Encounter for preprocedural laboratory examination: Secondary | ICD-10-CM | POA: Diagnosis not present

## 2024-03-14 HISTORY — DX: Unspecified osteoarthritis, unspecified site: M19.90

## 2024-03-14 LAB — SURGICAL PCR SCREEN
MRSA, PCR: NEGATIVE
Staphylococcus aureus: NEGATIVE

## 2024-03-15 ENCOUNTER — Encounter (HOSPITAL_COMMUNITY)
Admission: RE | Disposition: A | Discharge status: Home / Self Care | Payer: Self-pay | Source: Home / Self Care | Attending: Orthopedic Surgery

## 2024-03-15 ENCOUNTER — Ambulatory Visit (HOSPITAL_COMMUNITY): Payer: Self-pay | Admitting: Certified Registered"

## 2024-03-15 ENCOUNTER — Ambulatory Visit (HOSPITAL_COMMUNITY)
Admission: RE | Admit: 2024-03-15 | Discharge: 2024-03-15 | Disposition: A | Discharge status: Home / Self Care | Attending: Orthopedic Surgery | Admitting: Orthopedic Surgery

## 2024-03-15 ENCOUNTER — Other Ambulatory Visit: Payer: Self-pay

## 2024-03-15 ENCOUNTER — Encounter (HOSPITAL_COMMUNITY): Payer: Self-pay | Admitting: Orthopedic Surgery

## 2024-03-15 DIAGNOSIS — M25511 Pain in right shoulder: Secondary | ICD-10-CM | POA: Diagnosis not present

## 2024-03-15 DIAGNOSIS — W19XXXA Unspecified fall, initial encounter: Secondary | ICD-10-CM | POA: Diagnosis not present

## 2024-03-15 DIAGNOSIS — I10 Essential (primary) hypertension: Secondary | ICD-10-CM | POA: Insufficient documentation

## 2024-03-15 DIAGNOSIS — S42201A Unspecified fracture of upper end of right humerus, initial encounter for closed fracture: Secondary | ICD-10-CM | POA: Diagnosis not present

## 2024-03-15 DIAGNOSIS — S42291A Other displaced fracture of upper end of right humerus, initial encounter for closed fracture: Secondary | ICD-10-CM | POA: Insufficient documentation

## 2024-03-15 DIAGNOSIS — M1990 Unspecified osteoarthritis, unspecified site: Secondary | ICD-10-CM | POA: Diagnosis not present

## 2024-03-15 DIAGNOSIS — G8918 Other acute postprocedural pain: Secondary | ICD-10-CM | POA: Diagnosis not present

## 2024-03-15 HISTORY — PX: REVERSE SHOULDER ARTHROPLASTY: SHX5054

## 2024-03-15 SURGERY — ARTHROPLASTY, SHOULDER, TOTAL, REVERSE
Anesthesia: General | Site: Shoulder | Laterality: Right

## 2024-03-15 MED ORDER — TRANEXAMIC ACID 1000 MG/10ML IV SOLN: 1000.0000 mg | INTRAVENOUS | Status: DC

## 2024-03-15 MED ORDER — STERILE WATER FOR IRRIGATION IR SOLN
Status: DC | PRN
  Administered 2024-03-15: 1000 mL

## 2024-03-15 MED ORDER — FENTANYL CITRATE PF 50 MCG/ML IJ SOSY: 25.0000 ug | PREFILLED_SYRINGE | INTRAMUSCULAR | Status: DC | PRN

## 2024-03-15 MED ORDER — CYCLOBENZAPRINE HCL 10 MG PO TABS: 10.0000 mg | ORAL_TABLET | Freq: Three times a day (TID) | ORAL | 1 refills | Status: DC | PRN

## 2024-03-15 MED ORDER — BUPIVACAINE HCL (PF) 0.5 % IJ SOLN
INTRAMUSCULAR | Status: DC | PRN
Start: 2024-03-15 — End: 2024-03-15
  Administered 2024-03-15: 10 mL

## 2024-03-15 MED ORDER — ROCURONIUM BROMIDE 10 MG/ML (PF) SYRINGE
PREFILLED_SYRINGE | INTRAVENOUS | Status: AC
Start: 2024-03-15 — End: ?
  Filled 2024-03-15: qty 10

## 2024-03-15 MED ORDER — PROPOFOL 10 MG/ML IV BOLUS
INTRAVENOUS | Status: DC | PRN
  Administered 2024-03-15: 120 mg via INTRAVENOUS
  Administered 2024-03-15: 50 mg via INTRAVENOUS

## 2024-03-15 MED ORDER — BUPIVACAINE LIPOSOME 1.3 % IJ SUSP
INTRAMUSCULAR | Status: DC | PRN
Start: 2024-03-15 — End: 2024-03-15
  Administered 2024-03-15: 10 mL

## 2024-03-15 MED ORDER — OXYCODONE-ACETAMINOPHEN 5-325 MG PO TABS: 1.0000 | ORAL_TABLET | ORAL | 0 refills | Status: DC | PRN

## 2024-03-15 MED ORDER — DEXAMETHASONE SODIUM PHOSPHATE 10 MG/ML IJ SOLN
INTRAMUSCULAR | Status: AC
  Filled 2024-03-15: qty 1

## 2024-03-15 MED ORDER — FENTANYL CITRATE (PF) 100 MCG/2ML IJ SOLN
INTRAMUSCULAR | Status: DC | PRN
  Administered 2024-03-15 (×2): 50 ug via INTRAVENOUS

## 2024-03-15 MED ORDER — ONDANSETRON HCL 4 MG/2ML IJ SOLN
INTRAMUSCULAR | Status: DC | PRN
  Administered 2024-03-15: 4 mg via INTRAVENOUS

## 2024-03-15 MED ORDER — SUGAMMADEX SODIUM 200 MG/2ML IV SOLN
INTRAVENOUS | Status: DC | PRN
  Administered 2024-03-15: 200 mg via INTRAVENOUS

## 2024-03-15 MED ORDER — LACTATED RINGERS IV SOLN: INTRAVENOUS | Status: DC

## 2024-03-15 MED ORDER — ONDANSETRON HCL 4 MG/2ML IJ SOLN: 4.0000 mg | Freq: Once | INTRAMUSCULAR | Status: DC | PRN

## 2024-03-15 MED ORDER — ROCURONIUM BROMIDE 10 MG/ML (PF) SYRINGE
PREFILLED_SYRINGE | INTRAVENOUS | Status: DC | PRN
  Administered 2024-03-15: 70 mg via INTRAVENOUS

## 2024-03-15 MED ORDER — VANCOMYCIN HCL 1000 MG IV SOLR
INTRAVENOUS | Status: DC | PRN
  Administered 2024-03-15: 1000 mg via TOPICAL

## 2024-03-15 MED ORDER — TRANEXAMIC ACID-NACL 1000-0.7 MG/100ML-% IV SOLN
1000.0000 mg | INTRAVENOUS | Status: AC
  Administered 2024-03-15: 1000 mg via INTRAVENOUS
  Filled 2024-03-15: qty 100

## 2024-03-15 MED ORDER — VANCOMYCIN HCL 1000 MG IV SOLR
INTRAVENOUS | Status: AC
  Filled 2024-03-15: qty 20

## 2024-03-15 MED ORDER — METOCLOPRAMIDE HCL 5 MG/ML IJ SOLN: 5.0000 mg | Freq: Three times a day (TID) | INTRAMUSCULAR | Status: DC | PRN

## 2024-03-15 MED ORDER — PROPOFOL 10 MG/ML IV BOLUS
INTRAVENOUS | Status: AC
  Filled 2024-03-15: qty 20

## 2024-03-15 MED ORDER — PHENYLEPHRINE HCL-NACL 20-0.9 MG/250ML-% IV SOLN
INTRAVENOUS | Status: DC | PRN
Start: 2024-03-15 — End: 2024-03-15
  Administered 2024-03-15: 20 ug/min via INTRAVENOUS

## 2024-03-15 MED ORDER — ONDANSETRON HCL 4 MG/2ML IJ SOLN
INTRAMUSCULAR | Status: AC
  Filled 2024-03-15: qty 2

## 2024-03-15 MED ORDER — 0.9 % SODIUM CHLORIDE (POUR BTL) OPTIME
TOPICAL | Status: DC | PRN
  Administered 2024-03-15: 1000 mL

## 2024-03-15 MED ORDER — CEFAZOLIN SODIUM-DEXTROSE 2-4 GM/100ML-% IV SOLN
2.0000 g | INTRAVENOUS | Status: AC
  Administered 2024-03-15: 2 g via INTRAVENOUS
  Filled 2024-03-15: qty 100

## 2024-03-15 MED ORDER — OXYCODONE HCL 5 MG PO TABS: 5.0000 mg | ORAL_TABLET | Freq: Once | ORAL | Status: DC | PRN

## 2024-03-15 MED ORDER — MIDAZOLAM HCL 2 MG/2ML IJ SOLN: 1.0000 mg | Freq: Once | INTRAMUSCULAR | Status: DC

## 2024-03-15 MED ORDER — ONDANSETRON HCL 4 MG PO TABS: 4.0000 mg | ORAL_TABLET | Freq: Three times a day (TID) | ORAL | 0 refills | Status: DC | PRN

## 2024-03-15 MED ORDER — CHLORHEXIDINE GLUCONATE 0.12 % MT SOLN
15.0000 mL | Freq: Once | OROMUCOSAL | Status: AC
  Administered 2024-03-15: 15 mL via OROMUCOSAL

## 2024-03-15 MED ORDER — FENTANYL CITRATE (PF) 100 MCG/2ML IJ SOLN
INTRAMUSCULAR | Status: AC
  Filled 2024-03-15: qty 2

## 2024-03-15 MED ORDER — OXYCODONE HCL 5 MG/5ML PO SOLN: 5.0000 mg | Freq: Once | ORAL | Status: DC | PRN

## 2024-03-15 MED ORDER — METOCLOPRAMIDE HCL 5 MG PO TABS: 5.0000 mg | ORAL_TABLET | Freq: Three times a day (TID) | ORAL | Status: DC | PRN

## 2024-03-15 MED ORDER — DEXAMETHASONE SODIUM PHOSPHATE 10 MG/ML IJ SOLN
INTRAMUSCULAR | Status: DC | PRN
Start: 2024-03-15 — End: 2024-03-15
  Administered 2024-03-15: 4 mg via INTRAVENOUS

## 2024-03-15 MED ORDER — ACETAMINOPHEN 10 MG/ML IV SOLN: 1000.0000 mg | Freq: Once | INTRAVENOUS | Status: DC | PRN

## 2024-03-15 MED ORDER — LIDOCAINE HCL (PF) 2 % IJ SOLN
INTRAMUSCULAR | Status: AC
  Filled 2024-03-15: qty 5

## 2024-03-15 MED ORDER — FENTANYL CITRATE PF 50 MCG/ML IJ SOSY
50.0000 ug | PREFILLED_SYRINGE | Freq: Once | INTRAMUSCULAR | Status: AC
  Administered 2024-03-15: 50 ug via INTRAVENOUS
  Filled 2024-03-15: qty 2

## 2024-03-15 MED ORDER — CELECOXIB 200 MG PO CAPS: 200.0000 mg | ORAL_CAPSULE | Freq: Every day | ORAL | 2 refills | Status: DC

## 2024-03-15 MED ORDER — ORAL CARE MOUTH RINSE: 15.0000 mL | Freq: Once | OROMUCOSAL | Status: AC

## 2024-03-15 SURGICAL SUPPLY — 61 items
BAG COUNTER SPONGE SURGICOUNT (BAG) IMPLANT
BAG ZIPLOCK 12X15 (MISCELLANEOUS) ×1 IMPLANT
BLADE SAW SGTL 83.5X18.5 (BLADE) ×1 IMPLANT
BNDG COHESIVE 4X5 TAN STRL LF (GAUZE/BANDAGES/DRESSINGS) ×1 IMPLANT
CLSR STERI-STRIP ANTIMIC 1/2X4 (GAUZE/BANDAGES/DRESSINGS) IMPLANT
COOLER ICEMAN CLASSIC (MISCELLANEOUS) ×1 IMPLANT
COVER BACK TABLE 60X90IN (DRAPES) ×1 IMPLANT
COVER SURGICAL LIGHT HANDLE (MISCELLANEOUS) ×1 IMPLANT
CUP SUT UNIV REVERS 36 NEUTRAL (Cup) IMPLANT
DRAPE SHEET LG 3/4 BI-LAMINATE (DRAPES) ×1 IMPLANT
DRAPE SURG 17X11 SM STRL (DRAPES) ×1 IMPLANT
DRAPE SURG ORHT 6 SPLT 77X108 (DRAPES) ×2 IMPLANT
DRAPE TOP 10253 STERILE (DRAPES) ×1 IMPLANT
DRAPE U-SHAPE 47X51 STRL (DRAPES) ×1 IMPLANT
DRESSING AQUACEL AG SP 3.5X6 (GAUZE/BANDAGES/DRESSINGS) ×1 IMPLANT
DRSG AQUACEL AG ADV 3.5X 6 (GAUZE/BANDAGES/DRESSINGS) IMPLANT
DURAPREP 26ML APPLICATOR (WOUND CARE) ×1 IMPLANT
ELECT BLADE TIP CTD 4 INCH (ELECTRODE) ×1 IMPLANT
ELECT PENCIL ROCKER SW 15FT (MISCELLANEOUS) ×1 IMPLANT
ELECT REM PT RETURN 15FT ADLT (MISCELLANEOUS) ×1 IMPLANT
FACESHIELD WRAPAROUND (MASK) ×5 IMPLANT
FACESHIELD WRAPAROUND OR TEAM (MASK) ×5 IMPLANT
FIBERTAPE CERCLAGE TLINK SUT (SUTURE) IMPLANT
GLENOID UNI REV MOD 24 +2 LAT (Joint) IMPLANT
GLENOSPHERE 36 +4 LAT/24 (Joint) IMPLANT
GLOVE BIO SURGEON STRL SZ7.5 (GLOVE) ×1 IMPLANT
GLOVE BIO SURGEON STRL SZ8 (GLOVE) ×1 IMPLANT
GLOVE SS BIOGEL STRL SZ 7 (GLOVE) ×1 IMPLANT
GLOVE SS BIOGEL STRL SZ 7.5 (GLOVE) ×1 IMPLANT
GOWN STRL SURGICAL XL XLNG (GOWN DISPOSABLE) ×2 IMPLANT
INSERT HUMERAL UNI REVERS 36 3 (Insert) IMPLANT
KIT BASIN OR (CUSTOM PROCEDURE TRAY) ×1 IMPLANT
KIT TURNOVER KIT A (KITS) ×1 IMPLANT
MANIFOLD NEPTUNE II (INSTRUMENTS) ×1 IMPLANT
NDL TAPERED W/ NITINOL LOOP (MISCELLANEOUS) ×1 IMPLANT
NEEDLE TAPERED W/ NITINOL LOOP (MISCELLANEOUS) ×1 IMPLANT
NS IRRIG 1000ML POUR BTL (IV SOLUTION) ×1 IMPLANT
PACK SHOULDER (CUSTOM PROCEDURE TRAY) ×1 IMPLANT
PAD ARMBOARD POSITIONER FOAM (MISCELLANEOUS) ×1 IMPLANT
PAD COLD SHLDR WRAP-ON (PAD) ×1 IMPLANT
PIN NITINOL TARGETER 2.8 (PIN) IMPLANT
PIN SET MODULAR GLENOID SYSTEM (PIN) IMPLANT
RESTRAINT HEAD UNIVERSAL NS (MISCELLANEOUS) ×1 IMPLANT
SCREW CENTRAL MODULAR 25 (Screw) IMPLANT
SCREW PERI LOCK 5.5X16 (Screw) IMPLANT
SCREW PERI LOCK 5.5X36 (Screw) IMPLANT
SCREW PERIPHERAL 5.5X20 LOCK (Screw) IMPLANT
SCREW PERIPHERAL 5.5X28 LOCK (Screw) IMPLANT
SLING ARM FOAM STRAP LRG (SOFTGOODS) IMPLANT
SLING ARM FOAM STRAP MED (SOFTGOODS) IMPLANT
STEM HUMERAL UNI REVERS SZ6 (Stem) IMPLANT
STRIP CLOSURE SKIN 1/2X4 (GAUZE/BANDAGES/DRESSINGS) ×1 IMPLANT
SUT MNCRL AB 3-0 PS2 18 (SUTURE) ×1 IMPLANT
SUT MON AB 2-0 CT1 36 (SUTURE) ×1 IMPLANT
SUT VIC AB 1 CT1 36 (SUTURE) ×1 IMPLANT
SUTURE TAPE 1.3 40 TPR END (SUTURE) ×2 IMPLANT
TOWEL GREEN STERILE FF (TOWEL DISPOSABLE) ×1 IMPLANT
TOWEL OR 17X26 10 PK STRL BLUE (TOWEL DISPOSABLE) ×1 IMPLANT
TUBE SUCTION HIGH CAP CLEAR NV (SUCTIONS) ×1 IMPLANT
TUBING CONNECTING 10 (TUBING) ×1 IMPLANT
WATER STERILE IRR 1000ML POUR (IV SOLUTION) ×2 IMPLANT

## 2024-03-15 NOTE — Anesthesia Procedure Notes (Signed)
 Procedure Name: Intubation Date/Time: 03/15/2024 10:34 AM  Performed by: Alwyn Juba, CRNAPre-anesthesia Checklist: Patient identified, Emergency Drugs available, Suction available, Patient being monitored and Timeout performed Patient Re-evaluated:Patient Re-evaluated prior to induction Oxygen Delivery Method: Circle system utilized Preoxygenation: Pre-oxygenation with 100% oxygen Induction Type: IV induction Ventilation: Mask ventilation without difficulty Laryngoscope Size: Mac and 4 Grade View: Grade II Tube type: Oral Tube size: 7.0 mm Number of attempts: 1 Airway Equipment and Method: Stylet Placement Confirmation: ETT inserted through vocal cords under direct vision, positive ETCO2 and breath sounds checked- equal and bilateral Secured at: 22 cm Tube secured with: Tape Dental Injury: Teeth and Oropharynx as per pre-operative assessment

## 2024-03-15 NOTE — Anesthesia Preprocedure Evaluation (Addendum)
 Anesthesia Evaluation  Patient identified by MRN, date of birth, ID band Patient awake    Reviewed: Allergy & Precautions, NPO status , Patient's Chart, lab work & pertinent test results, reviewed documented beta blocker date and time   History of Anesthesia Complications (+) PONV and history of anesthetic complications  Airway Mallampati: II  TM Distance: >3 FB     Dental no notable dental hx.    Pulmonary neg COPD, neg PE   breath sounds clear to auscultation       Cardiovascular hypertension, (-) CAD, (-) Past MI and (-) Cardiac Stents  Rhythm:Regular Rate:Normal     Neuro/Psych neg Seizures PSYCHIATRIC DISORDERS Anxiety Depression       GI/Hepatic ,neg GERD  ,,(+) neg Cirrhosis        Endo/Other    Renal/GU Renal disease     Musculoskeletal  (+) Arthritis , Osteoarthritis,    Abdominal   Peds  Hematology  (+) Blood dyscrasia, anemia   Anesthesia Other Findings   Reproductive/Obstetrics                              Anesthesia Physical Anesthesia Plan  ASA: 2  Anesthesia Plan: General   Post-op Pain Management: Regional block*   Induction: Intravenous  PONV Risk Score and Plan: 2 and Ondansetron and Dexamethasone  Airway Management Planned: Oral ETT  Additional Equipment:   Intra-op Plan:   Post-operative Plan: Extubation in OR  Informed Consent: I have reviewed the patients History and Physical, chart, labs and discussed the procedure including the risks, benefits and alternatives for the proposed anesthesia with the patient or authorized representative who has indicated his/her understanding and acceptance.     Dental advisory given  Plan Discussed with: CRNA  Anesthesia Plan Comments:          Anesthesia Quick Evaluation

## 2024-03-15 NOTE — Evaluation (Signed)
 Occupational Therapy Evaluation Patient Details Name: Cynthia Berry MRN: [id number] DOB: August 28, 1946 Today's Date: 03/15/2024   History of Present Illness   Cynthia Berry is a 78 yo female s/p fall with displaced 2 part prox humeral fx. Underwent R Reverse TSA on 03/15/24. PMH: OA, anemia, anxiety, depression, HTN     Clinical Impressions PTA pt lives alone but since fall dtr and SIL staying with patient prior to this am surgery. Prior to fall, patient was independent and driving.  Education completed regarding compensatory strategies for ADL tasks and functional mobility, management of sling, L ROM per specified parameters in the order set as indicated below, positioning of operative arm in sitting and supine and edema control, including use of "Iceman" Cold Therapy machine. Caregiver present for education, written handouts provided and reviewed using Teach Back and pt/caregiver verbalized/demonstrated understanding. Due to the below listed deficits, pt requires min A  assistance with ADL tasks and Supervision assist with functional mobility. Caregiver will be able to provide necessary level of assistance at discharge. Pt to follow up with MD to progress rehab of the operative shoulder. Daughter and SIL independent with above with + teach back and demonstration.       If plan is discharge home, recommend the following:   A little help with walking and/or transfers;Assistance with cooking/housework;Assist for transportation;Help with stairs or ramp for entrance;A little help with bathing/dressing/bathroom     Functional Status Assessment   Patient has had a recent decline in their functional status and demonstrates the ability to make significant improvements in function in a reasonable and predictable amount of time.     Equipment Recommendations   None recommended by OT      Precautions/Restrictions   Precautions Precautions: Shoulder Shoulder FF 0-60; ER 0-20; Abd 0-45. ROM for ADL  purposes only; AROM elbow/wrist/hand; ok to dangle for bathing, no pendulums, Loosen sling from neck when arm is supported in sitting  Required Braces or Orthoses: Sling Restrictions Weight Bearing Restrictions Per Provider Order: Yes RUE Weight Bearing Per Provider Order: Non weight bearing     Mobility Bed Mobility Overal bed mobility:  (recliner level eval with sleeping and bed mobility instructions provided)                  Transfers Overall transfer level: Modified independent                 General transfer comment: sling in place      Balance Overall balance assessment: No apparent balance deficits (not formally assessed)                                         ADL either performed or assessed with clinical judgement   ADL Overall ADL's : Needs assistance/impaired    Per orders, R shoulder parameters as follows for ADL tasks: Abd 0-45; ER 0-20; FF 0-60.  While moving within specified parameters, pt/caregiver instructed on bathing and how to donn/doff shirt, placing operative arm through sleeve first when donning and off last when doffing.Pt/caregiver educated on compensatory strategies for LB ADL and strategies to reduce risk of falls.  Pt/caregiver educated on donning/doffing sling and to wear the sling at all times with the exception of ADL, and to loosen the neck strap of the sling when the operative arm is in a supported position when sitting. In sitting or supine, pt instructed to  have a pillow behind and under their operative arm to provide support. If assist needed with ambulation, caregiver educated on the importance of walking on pt's non-operative side.  Education regarding use of "IceMan" Cold Therapy completed, including the importance of using a barrier on the shoulder prior to positioning the wrap-on pad. Pt/caregiver verbalized/demonstrated understanding. Teach Back used while caregiver assisted with dressing pt and positioning  "wrap-on pad" to facilitate DC.                                          Vision Baseline Vision/History: 1 Wears glasses Patient Visual Report: No change from baseline              Pertinent Vitals/Pain Pain Assessment Pain Assessment: No/denies pain     Extremity/Trunk Assessment Upper Extremity Assessment Upper Extremity Assessment: Right hand dominant;RUE deficits/detail RUE Deficits / Details: R post op n block active with elbow, wrist, hand WFL's for AAROM   Lower Extremity Assessment Lower Extremity Assessment: Overall WFL for tasks assessed   Cervical / Trunk Assessment Cervical / Trunk Assessment: Normal   Communication Communication Communication: No apparent difficulties   Cognition Arousal: Alert Behavior During Therapy: WFL for tasks assessed/performed                                 Following commands: Intact       Cueing  General Comments   Cueing Techniques: Verbal cues  post op dressing in place on R shoulder, bruising remains on R eye from fall last week   Exercises Exercises: Shoulder Shoulder Exercises Elbow Flexion: 10 reps, AAROM Elbow Extension: AAROM, Self ROM, Right, 10 reps Wrist Flexion: AROM, Right, 10 reps Wrist Extension: AROM, Right, 10 reps Digit Composite Flexion: AROM, Right, 10 reps Composite Extension: AROM, Right, 10 reps   Shoulder Instructions Shoulder Instructions Donning/doffing shirt without moving shoulder: Caregiver independent with task;Patient able to independently direct caregiver Method for sponge bathing under operated UE: Caregiver independent with task;Patient able to independently direct caregiver Donning/doffing sling/immobilizer: Caregiver independent with task;Patient able to independently direct caregiver Correct positioning of sling/immobilizer: Caregiver independent with task;Patient able to independently direct caregiver ROM for elbow, wrist and digits of operated UE:  Caregiver independent with task;Patient able to independently direct caregiver Sling wearing schedule (on at all times/off for ADL's): Caregiver independent with task;Patient able to independently direct caregiver Proper positioning of operated UE when showering: Caregiver independent with task;Patient able to independently direct caregiver Positioning of UE while sleeping: Caregiver independent with task;Patient able to independently direct caregiver    Home Living Family/patient expects to be discharged to:: Private residence Living Arrangements: Alone Available Help at Discharge: Family;Available 24 hours/day Type of Home: House Home Access: Stairs to enter [employer] of Steps: 4 Entrance Stairs-Rails: Left Home Layout: One level     Bathroom Shower/Tub: Chief Strategy Officer: Standard Bathroom Accessibility: Yes How Accessible: Accessible via walker Home Equipment: Shower seat;Rolling Walker (2 wheels);Cane - single point   Additional Comments: daughter has been assisting since fall      Prior Functioning/Environment Prior Level of Function : Independent/Modified Independent;Driving             Mobility Comments: no assistive device ADLs Comments: independent    OT Problem List: Impaired UE functional use    AM-PAC OT "  6 Clicks" Daily Activity     Outcome Measure Help from another person eating meals?: None Help from another person taking care of personal grooming?: None Help from another person toileting, which includes using toliet, bedpan, or urinal?: A Little Help from another person bathing (including washing, rinsing, drying)?: A Little Help from another person to put on and taking off regular upper body clothing?: A Little Help from another person to put on and taking off regular lower body clothing?: A Little 6 Click Score: 20   End of Session Equipment Utilized During Treatment: Gait belt (sling) Nurse Communication: Mobility  status;Weight bearing status  Activity Tolerance: Patient tolerated treatment well Patient left: in chair;with family/visitor present;with call bell/phone within reach  OT Visit Diagnosis: Other (comment) (R UE dysfunction)                Time: 1610-1700 OT Time Calculation (min): 50 min Charges:  OT General Charges $OT Visit: 1 Visit OT Evaluation $OT Eval Low Complexity: 1 Low OT Treatments $Self Care/Home Management : 23-37 mins  Anastasya Jewell OT/L Acute Rehabilitation Department  [phone]  03/15/2024, 5:38 PM

## 2024-03-15 NOTE — Anesthesia Postprocedure Evaluation (Signed)
 Anesthesia Post Note  Patient: Cynthia Berry  Procedure(s) Performed: ARTHROPLASTY, SHOULDER, TOTAL, REVERSE (Right: Shoulder)     Patient location during evaluation: PACU Anesthesia Type: General Level of consciousness: awake and alert Pain management: pain level controlled Vital Signs Assessment: post-procedure vital signs reviewed and stable Respiratory status: spontaneous breathing, nonlabored ventilation, respiratory function stable and patient connected to nasal cannula oxygen Cardiovascular status: blood pressure returned to baseline and stable Postop Assessment: no apparent nausea or vomiting Anesthetic complications: no   No notable events documented.  Last Vitals:  Vitals:   03/15/24 1445 03/15/24 1500  BP: (!) 149/90 (!) 146/99  Pulse: (!) 105 (!) 104  Resp: 16 16  Temp:    SpO2: 90% 90%    Last Pain:  Vitals:   03/15/24 1500  TempSrc:   PainSc: 0-No pain                 Leslye Rast

## 2024-03-15 NOTE — Anesthesia Procedure Notes (Signed)
 Anesthesia Regional Block: Interscalene brachial plexus block   Pre-Anesthetic Checklist: , timeout performed,  Correct Patient, Correct Site, Correct Laterality,  Correct Procedure, Correct Position, site marked,  Risks and benefits discussed,  Surgical consent,  Pre-op evaluation,  At surgeon's request and post-op pain management  Laterality: Right  Prep: Maximum Sterile Barrier Precautions used, chloraprep       Needles:  Injection technique: Single-shot  Needle Type: Echogenic Needle      Needle Gauge: 20     Additional Needles:   Procedures:,,,, ultrasound used (permanent image in chart),,    Narrative:  Start time: 03/15/2024 9:02 AM End time: 03/15/2024 9:08 AM Injection made incrementally with aspirations every 5 mL.  Performed by: Personally  Anesthesiologist: Leslye Rast, MD

## 2024-03-15 NOTE — Transfer of Care (Signed)
 Immediate Anesthesia Transfer of Care Note  Patient: Cynthia Berry  Procedure(s) Performed: ARTHROPLASTY, SHOULDER, TOTAL, REVERSE (Right: Shoulder)  Patient Location: PACU  Anesthesia Type:General  Level of Consciousness: awake, drowsy, and patient cooperative  Airway & Oxygen Therapy: Patient Spontanous Breathing and Patient connected to face mask oxygen  Post-op Assessment: Report given to RN and Post -op Vital signs reviewed and stable  Post vital signs: Reviewed and stable  Last Vitals:  Vitals Value Taken Time  BP 145/71 03/15/24 1236  Temp    Pulse 94 03/15/24 1238  Resp 17 03/15/24 1238  SpO2 95 % 03/15/24 1238  Vitals shown include unfiled device data.  Last Pain:  Vitals:   03/15/24 0910  TempSrc:   PainSc: 0-No pain         Complications: No notable events documented.

## 2024-03-15 NOTE — H&P (Signed)
 Cynthia Berry    Chief Complaint: Right 2 Part Proximal Humerus fracture HPI: The patient is a 78 y.o. female status post ground-level fall sustaining displaced right 2 part proximal humerus fracture.  Due to the degree of displacement and angulation patient is brought to the operating this time for planned right shoulder reverse arthroplasty.  Past Medical History:  Diagnosis Date   Anemia    Anxiety    Pt very anxious/sensitive to certain meds!   Arthritis    thumbs   COVID 2022   Depression    Hx of adenomatous polyp of colon 04/30/2016   Hyperlipidemia    Hypertension    Post-operative nausea and vomiting    due to nerves      Past Surgical History:  Procedure Laterality Date   COLONOSCOPY     TONSILLECTOMY     as child    Family History  Problem Relation Age of Onset   Stroke Mother 3   Heart disease Father 31       mi, smoker   Lung cancer Sister        1/2 sister-lung cancer   Heart disease Sister    Colon cancer Neg Hx    Esophageal cancer Neg Hx    Rectal cancer Neg Hx    Stomach cancer Neg Hx    Breast cancer Neg Hx     Social History:  reports that she has never smoked. She has never used smokeless tobacco. She reports current alcohol use. She reports that she does not use drugs.  BMI: Estimated body mass index is 40.59 kg/m as calculated from the following:   Height as of 03/14/24: 5\' 1"  (1.549 m).   Weight as of 03/12/24: 97.4 kg.  Lab Results  Component Value Date   ALBUMIN 3.1 (L) 03/07/2024   Diabetes: Patient does not have a diagnosis of diabetes. Lab Results  Component Value Date   HGBA1C 5.7 05/09/2023     Smoking Status:   reports that she has never smoked. She has never used smokeless tobacco.     No medications prior to admission.     Physical Exam: Right shoulder demonstrates diffuse swelling, evolving ecchymosis, and global tenderness as noted at her recent office visit.  She is neurovascular intact in the right upper  extremity.  Pain and guarding with any attempts at passive motion.  Imaging  Plain films of the right shoulder demonstrate a markedly displaced and angulated right 2 part proximal humerus fracture.  Vitals  Temp:  [97.9 F (36.6 C)] 97.9 F (36.6 C) (06/04 1058) Pulse Rate:  [92] 92 (06/04 1058) SpO2:  [99 %] 99 % (06/04 1058)  Assessment/Plan  Impression: Right 2 Part Proximal Humerus fracture  Plan of Action: Procedure(s): ARTHROPLASTY, SHOULDER, TOTAL, REVERSE  Cynthia Berry 03/15/2024, 6:22 AM Contact # [phone]

## 2024-03-15 NOTE — Discharge Instructions (Signed)
 Cynthia Berry. Supple, M.D., F.A.A.O.S. [facility] Specializing in Arthroscopic and Reconstructive Surgery of the Shoulder [phone] [address] [place], [place] [address] - Fax [phone]   POST-OP TOTAL SHOULDER REPLACEMENT INSTRUCTIONS  1. Follow up in the office for your first post-op appointment 10-14 days from the date of your surgery. If you do not already have a scheduled appointment, our office will contact you to schedule.  2. The bandage over your incision is waterproof. You may begin showering with this dressing on. You may leave this dressing on until first follow up appointment within 2 weeks. We prefer you leave this dressing in place until follow up however after 5-7 days if you are having itching or skin irritation and would like to remove it you may do so. Go slow and tug at the borders gently to break the bond the dressing has with the skin. At this point if there is no drainage it is okay to go without a bandage or you may cover it with a light guaze and tape. You can also expect significant bruising around your shoulder that will drift down your arm and into your chest wall. This is very normal and should resolve over several days.   3. Wear your sling/immobilizer at all times except to perform the exercises below or to occasionally let your arm dangle by your side to stretch your elbow. You also need to sleep in your sling immobilizer until instructed otherwise. It is ok to remove your sling if you are sitting in a controlled environment and allow your arm to rest in a position of comfort by your side or on your lap with pillows to give your neck and skin a break from the sling. You may remove it to allow arm to dangle by side to shower. If you are up walking around and when you go to sleep at night you need to wear it.  4. Range of motion to your elbow, wrist, and hand are encouraged 3-5 times daily. Exercise to your hand and fingers helps to reduce  swelling you may experience.   5. Prescriptions for a pain medication and a muscle relaxant are provided for you. It is recommended that if you are experiencing pain that you pain medication alone is not controlling, add the muscle relaxant along with the pain medication which can give additional pain relief. The first 1-2 days is generally the most severe of your pain and then should gradually decrease. As your pain lessens it is recommended that you decrease your use of the pain medications to an "as needed basis'" only and to always comply with the recommended dosages of the pain medications.  6. Pain medications can produce constipation along with their use. If you experience this, the use of an over the counter stool softener or laxative daily is recommended.   7. For additional questions or concerns, please do not hesitate to call the office. If after hours there is an answering service to forward your concerns to the physician or physician assistant on call.  8.Pain control following an exparel block  To help control your post-operative pain you received a nerve block  performed with Exparel which is a long acting anesthetic (numbing agent) which can provide pain relief and sensations of numbness (and relief of pain) in the operative shoulder and arm for up to 3 days. Sometimes it provides mixed relief, meaning you may still have numbness in certain areas of the arm but can  still be able to move  parts of that arm, hand, and fingers. We recommend that your prescribed pain medications  be used as needed. We do not feel it is necessary to "pre medicate" and "stay ahead" of pain.  Taking narcotic pain medications when you are not having any pain can lead to unnecessary and potentially dangerous side effects.    While the effects of the nerve block are present, please be aware that while you do not have sensation we recommend you pay careful attention to keep your arm positioned in a protective way.  Also if you have a sling that has a "thumb loop" that helps to keep the sling from sliding backwards, make sure you remove the loop to avoid a constant pressure to the thumb which can cause some nerve damage or skin breakdown.  9. Use the ice machine as much as possible in the first 5-7 days from surgery, then you can wean its use to as needed. The ice typically needs to be replaced every 6 hours, instead of ice you can actually freeze water bottles to put in the cooler and then fill water around them to avoid having to purchase ice. You can have spare water bottles freezing to allow you to rotate them once they have melted. Try to have a thin shirt or light cloth or towel under the ice wrap to protect your skin.   FOR ADDITIONAL INFO ON ICE MACHINE AND INSTRUCTIONS GO TO THE WEBSITE AT  [email or web address]   10. Most people find it more comfortable to sleep in a semi upright position or in a recliner with the arm supported. Again, we do recommend you wear your sling to sleep. It is also ok to try to sleep lying flat in bed with a pillow behind your arm to keep it from sliding or falling backwards. If you are trying to sleep on the non-operative side, please use pillows to position your arm so that it does not slide forwards or backwards. It is very common that sleeping and getting into a comfortable position is difficult after shoulder surgery, this should improve with time.  11. Dressing - It is recommended you wear shirts that are loose or button up the front. To put shirt on allow operative arm to dangle by your side and slide the shirt on that arm, then your head and then the non-operative arm. To remove the shirt do this sequence in reverse. We recommend you wear the sling over top of your shirt to prevent skin irritation. If you notice irritation in your axilla (arm pit) please try to elevate arm away from your body to allow air to get in this area and  consider use of an over the counter ointment such as hydrocortisone or simple lotion.  12.  We recommend that you avoid any dental work or cleaning in the first 3 months following your joint replacement. This is to help minimize the possibility of infection from the bacteria in your mouth that enters your bloodstream during dental work. We also recommend that you take an antibiotic prior to your dental work for the first year after your shoulder replacement to further help reduce that risk. Please simply contact our office for antibiotics to be sent to your pharmacy prior to dental work.  13. Dental Antibiotics:  We recommend waiting at least 3 months for any dental work even cleanings unless there is a Actuary. We also recommend  prophylactic antibiotics for all dental procdeures  the  first year following your joint replacement. In some exceptions we recommend them to be used lifelong. We will provide you with that prescription in follow up office visits, or you can call our office.   POST-OP EXERCISES  No shoulder exercises, can allow arm to dangle and move elbow wrist and hand for hygiene

## 2024-03-15 NOTE — Op Note (Signed)
 03/15/2024  12:13 PM  PATIENT:   Cynthia Berry  78 y.o. female  PRE-OPERATIVE DIAGNOSIS: Severely displaced and angulated right 2 Part Proximal Humerus fracture  POST-OPERATIVE DIAGNOSIS: Same  PROCEDURE: Right shoulder reverse arthroplasty lysing a press-fit size 6 Arthrex stem with a neutral metathesis, +3 constrained polyethylene insert, 36/+4 glenosphere and a small/+2 baseplate  SURGEON:  Eathan Groman, Genevieve Kerry M.D.  ASSISTANTS: Rand Burrs, PA-C  Rand Burrs, PA-C was utilized as an Geophysicist/field seismologist throughout this case, essential for help with positioning the patient, positioning extremity, tissue manipulation, implantation of the prosthesis, suture management, wound closure, and intraoperative decision-making.  ANESTHESIA:   General Endotracheal and interscalene block with Exparel  EBL: 200 cc  SPECIMEN: None  Drains: None   PATIENT DISPOSITION:  PACU - hemodynamically stable.    PLAN OF CARE: Discharge to home after PACU  Brief history:  Patient is a 78 year old female status post ground-level fall sustaining a severely displaced and angulated as well as impacted right 2 part proximal humerus fracture.  Due to the degree of bony impaction and displacement she is brought to the operating this time for planned right shoulder reverse arthroplasty.  Preoperatively, I counseled the patient regarding treatment options and risks versus benefits thereof.  Possible surgical complications were all reviewed including potential for bleeding, infection, neurovascular injury, persistent pain, loss of motion, anesthetic complication, failure of the implant, and possible need for additional surgery. They understand and accept and agrees with our planned procedure.   Procedure detail:  After undergoing routine preop evaluation the patient received prophylactic antibiotics and interscalene block with Exparel was established in the holding area by the anesthesia department.  Subsequently placed  spine on the operating table and underwent this with induction of a general endotracheal anesthesia.  Placed into the beachchair position and appropriately padded and protected.  The right shoulder girdle region was sterilely prepped and draped in standard fashion.  Timeout was called.  A deltopectoral approach to the right shoulder was made for an approximately 10 cm incision.  Skin flaps elevated dissection carried deep in the deltopectoral interval was then developed from proximal to distal with the vein taken laterally.  The conjoined tendon was mobilized and retracted medially.  Adhesions were divided beneath the deltoid.  The long head biceps tendon was then tenodesed at the upper border the pectoralis major tendon with the proximal segment unroofed and excised we used this as our landmark for division between the tuberosities and the subscapularis was separated from the lesser tuberosity using electrocautery and was tagged with a pair of grasping suture tape sutures.  An osteotome was then used to separate the greater tuberosity from the articular segment leaving a appropriate sized margin of bone with the rotator cuff insertion superiorly and posteriorly and at this point then we could remove the articular segment as a single fragment.  There was an additional number of comminuted displaced fragments in the shoulder joint which were removed piecemeal.  We did tag the superior and posterior rotator cuff insertions at the bone tendon junction using grasping suture tape sutures.  The greater tuberosity fragment was then trimmed to the appropriate contours.  This point we then exposed the glenoid and performed a circumferential labral resection.  A guidepin was directed into the center of the glenoid and the glenoid was then reamed with the central followed by the peripheral reamer to a stable subchondral bony bed in preparation completed with a drill and tapped for a 25 mm lag screw.  Our baseplate was then  assembled and inserted with vancomycin powder applied to the threads of the lag screw.  Excellent fixation was achieved.  A 36/+4 glenosphere was then impacted onto the baseplate and the central locking screw was placed.  We then returned our attention to the humeral canal which was opened by hand reaming and we broached to a size 6 at 20 degrees of retroversion.  A neutral metaphyseal reaming guide was then applied and trial reduction showed proper motion stability and soft tissue balance.  Our trial was then removed.  A final implant was assembled.  The canal was irrigated cleaned and dried and the vancomycin powder applied in the canal.  Our final implant was then seated at 20 degrees of retroversion achieving excellent fixation.  Trial reduction showed good fit and fixation with good soft tissue balance.  At this point we had selected a +3 poly.  A plastic constrained poly was impacted on the implant and our final reduction was then performed.  We then confirmed that the greater tuberosity had appropriate reduction around the implant and to the shaft and I should mention that prior to broaching of the humeral canal we did place a fiber cerclage around the humeral metaphysis to protect from potential propagation of the unicortical fractures.  Once her final implant was seated then terminal tightening of the fiber cerclage was completed.  We then repaired the lesser tuberosity using the previously placed sutures back to the humeral shaft using the limbs of the fiber cerclage and then also repaired the subscap to the greater tuberosity and rotator interval all of the previously placed sutures attaching the greater tuberosity back to the implant and to the humeral shaft with the overall construct showing good soft tissue balance and soft tissue envelope around the implant and good stability.  Final irrigation was then completed.  Hemostasis was obtained.  Balance of the vancomycin powder was then spread liberally  throughout the deep soft tissue planes.  The deltopectoral interval was reapproximated with a series of figure-of-eight number Vicryl sutures.  2-0 Monocryl used to close the subcu layer and intracuticular 3-0 Monocryl used to close the skin followed by Steri-Strips and Aquacel dressing.  The right arm was placed into a sling and the patient was awakened, extubated, and taken to the recovery in stable condition.  Glo Larch MD   Contact # [phone]

## 2024-03-19 ENCOUNTER — Telehealth: Payer: Self-pay

## 2024-03-19 ENCOUNTER — Encounter (HOSPITAL_COMMUNITY): Payer: Self-pay | Admitting: Orthopedic Surgery

## 2024-03-19 NOTE — Telephone Encounter (Signed)
 Noted.   Copied from CRM [id number]. Topic: Referral - Status >> Mar 16, 2024  2:35 PM Abigail D wrote: Reason for CRM: [facility] Berry with [facility] called to advise that Cynthia Berry has requested a delay in her start of service to 6/7. They will be seeing her tomorrow.

## 2024-03-28 DIAGNOSIS — Z96611 Presence of right artificial shoulder joint: Secondary | ICD-10-CM | POA: Diagnosis not present

## 2024-03-28 DIAGNOSIS — Z5189 Encounter for other specified aftercare: Secondary | ICD-10-CM | POA: Diagnosis not present

## 2024-04-16 ENCOUNTER — Encounter: Payer: Self-pay | Admitting: Family Medicine

## 2024-04-20 DIAGNOSIS — H2513 Age-related nuclear cataract, bilateral: Secondary | ICD-10-CM | POA: Diagnosis not present

## 2024-04-20 DIAGNOSIS — H35373 Puckering of macula, bilateral: Secondary | ICD-10-CM | POA: Diagnosis not present

## 2024-04-20 DIAGNOSIS — H5203 Hypermetropia, bilateral: Secondary | ICD-10-CM | POA: Diagnosis not present

## 2024-05-02 DIAGNOSIS — Z471 Aftercare following joint replacement surgery: Secondary | ICD-10-CM | POA: Diagnosis not present

## 2024-05-02 DIAGNOSIS — Z5189 Encounter for other specified aftercare: Secondary | ICD-10-CM | POA: Diagnosis not present

## 2024-05-02 DIAGNOSIS — Z96619 Presence of unspecified artificial shoulder joint: Secondary | ICD-10-CM | POA: Diagnosis not present

## 2024-05-02 DIAGNOSIS — Z96611 Presence of right artificial shoulder joint: Secondary | ICD-10-CM | POA: Diagnosis not present

## 2024-05-20 ENCOUNTER — Other Ambulatory Visit: Payer: Self-pay | Admitting: Family Medicine

## 2024-05-22 DIAGNOSIS — M25511 Pain in right shoulder: Secondary | ICD-10-CM | POA: Diagnosis not present

## 2024-05-28 DIAGNOSIS — M25511 Pain in right shoulder: Secondary | ICD-10-CM | POA: Diagnosis not present

## 2024-06-01 DIAGNOSIS — M25511 Pain in right shoulder: Secondary | ICD-10-CM | POA: Diagnosis not present

## 2024-06-04 DIAGNOSIS — M25511 Pain in right shoulder: Secondary | ICD-10-CM | POA: Diagnosis not present

## 2024-06-08 DIAGNOSIS — M25511 Pain in right shoulder: Secondary | ICD-10-CM | POA: Diagnosis not present

## 2024-06-12 DIAGNOSIS — M25511 Pain in right shoulder: Secondary | ICD-10-CM | POA: Diagnosis not present

## 2024-06-14 DIAGNOSIS — M25511 Pain in right shoulder: Secondary | ICD-10-CM | POA: Diagnosis not present

## 2024-06-15 DIAGNOSIS — Z5189 Encounter for other specified aftercare: Secondary | ICD-10-CM | POA: Diagnosis not present

## 2024-06-15 DIAGNOSIS — Z96611 Presence of right artificial shoulder joint: Secondary | ICD-10-CM | POA: Diagnosis not present

## 2024-06-18 DIAGNOSIS — M25511 Pain in right shoulder: Secondary | ICD-10-CM | POA: Diagnosis not present

## 2024-06-21 DIAGNOSIS — M25511 Pain in right shoulder: Secondary | ICD-10-CM | POA: Diagnosis not present

## 2024-06-25 DIAGNOSIS — M25511 Pain in right shoulder: Secondary | ICD-10-CM | POA: Diagnosis not present

## 2024-06-28 DIAGNOSIS — M25511 Pain in right shoulder: Secondary | ICD-10-CM | POA: Diagnosis not present

## 2024-07-10 DIAGNOSIS — M25511 Pain in right shoulder: Secondary | ICD-10-CM | POA: Diagnosis not present

## 2024-07-13 DIAGNOSIS — M25511 Pain in right shoulder: Secondary | ICD-10-CM | POA: Diagnosis not present

## 2024-07-17 DIAGNOSIS — M25511 Pain in right shoulder: Secondary | ICD-10-CM | POA: Diagnosis not present

## 2024-07-19 DIAGNOSIS — M25511 Pain in right shoulder: Secondary | ICD-10-CM | POA: Diagnosis not present

## 2024-07-23 DIAGNOSIS — Z96611 Presence of right artificial shoulder joint: Secondary | ICD-10-CM | POA: Diagnosis not present

## 2024-07-24 DIAGNOSIS — M25511 Pain in right shoulder: Secondary | ICD-10-CM | POA: Diagnosis not present

## 2024-07-27 DIAGNOSIS — M25511 Pain in right shoulder: Secondary | ICD-10-CM | POA: Diagnosis not present

## 2024-07-30 DIAGNOSIS — M25511 Pain in right shoulder: Secondary | ICD-10-CM | POA: Diagnosis not present

## 2024-08-01 DIAGNOSIS — M25511 Pain in right shoulder: Secondary | ICD-10-CM | POA: Diagnosis not present

## 2024-08-06 DIAGNOSIS — M25511 Pain in right shoulder: Secondary | ICD-10-CM | POA: Diagnosis not present

## 2024-08-09 DIAGNOSIS — M25511 Pain in right shoulder: Secondary | ICD-10-CM | POA: Diagnosis not present

## 2024-08-23 ENCOUNTER — Ambulatory Visit

## 2024-08-23 ENCOUNTER — Other Ambulatory Visit (HOSPITAL_BASED_OUTPATIENT_CLINIC_OR_DEPARTMENT_OTHER): Payer: Self-pay

## 2024-08-23 DIAGNOSIS — Z23 Encounter for immunization: Secondary | ICD-10-CM | POA: Diagnosis not present

## 2024-08-23 MED ORDER — FLUZONE HIGH-DOSE 0.5 ML IM SUSY
0.5000 mL | PREFILLED_SYRINGE | Freq: Once | INTRAMUSCULAR | 0 refills | Status: AC
  Filled 2024-08-23: qty 0.5, 1d supply, fill #0

## 2024-09-11 ENCOUNTER — Encounter: Payer: Self-pay | Admitting: Family Medicine

## 2024-09-11 ENCOUNTER — Ambulatory Visit: Payer: Self-pay | Admitting: Family Medicine

## 2024-09-11 ENCOUNTER — Ambulatory Visit: Admitting: Family Medicine

## 2024-09-11 VITALS — BP 130/78 | HR 96 | Temp 97.6°F | Ht 61.0 in | Wt 218.8 lb

## 2024-09-11 DIAGNOSIS — Z131 Encounter for screening for diabetes mellitus: Secondary | ICD-10-CM | POA: Diagnosis not present

## 2024-09-11 DIAGNOSIS — I10 Essential (primary) hypertension: Secondary | ICD-10-CM

## 2024-09-11 DIAGNOSIS — R739 Hyperglycemia, unspecified: Secondary | ICD-10-CM | POA: Diagnosis not present

## 2024-09-11 DIAGNOSIS — E785 Hyperlipidemia, unspecified: Secondary | ICD-10-CM | POA: Diagnosis not present

## 2024-09-11 DIAGNOSIS — Z1211 Encounter for screening for malignant neoplasm of colon: Secondary | ICD-10-CM

## 2024-09-11 DIAGNOSIS — Z6841 Body Mass Index (BMI) 40.0 and over, adult: Secondary | ICD-10-CM | POA: Diagnosis not present

## 2024-09-11 LAB — CBC WITH DIFFERENTIAL/PLATELET
Basophils Absolute: 0.1 K/uL (ref 0.0–0.1)
Basophils Relative: 1.2 % (ref 0.0–3.0)
Eosinophils Absolute: 0.1 K/uL (ref 0.0–0.7)
Eosinophils Relative: 1 % (ref 0.0–5.0)
HCT: 39.2 % (ref 36.0–46.0)
Hemoglobin: 13 g/dL (ref 12.0–15.0)
Lymphocytes Relative: 29.1 % (ref 12.0–46.0)
Lymphs Abs: 1.8 K/uL (ref 0.7–4.0)
MCHC: 33 g/dL (ref 30.0–36.0)
MCV: 87.2 fl (ref 78.0–100.0)
Monocytes Absolute: 0.5 K/uL (ref 0.1–1.0)
Monocytes Relative: 7.6 % (ref 3.0–12.0)
Neutro Abs: 3.8 K/uL (ref 1.4–7.7)
Neutrophils Relative %: 61.1 % (ref 43.0–77.0)
Platelets: 282 K/uL (ref 150.0–400.0)
RBC: 4.5 Mil/uL (ref 3.87–5.11)
RDW: 13.9 % (ref 11.5–15.5)
WBC: 6.3 K/uL (ref 4.0–10.5)

## 2024-09-11 LAB — HEMOGLOBIN A1C: Hgb A1c MFr Bld: 5.6 % (ref 4.6–6.5)

## 2024-09-11 LAB — URINALYSIS, ROUTINE W REFLEX MICROSCOPIC
Bilirubin Urine: NEGATIVE
Hgb urine dipstick: NEGATIVE
Ketones, ur: NEGATIVE
Leukocytes,Ua: NEGATIVE
Nitrite: NEGATIVE
Specific Gravity, Urine: 1.01 (ref 1.000–1.030)
Total Protein, Urine: NEGATIVE
Urine Glucose: NEGATIVE
Urobilinogen, UA: 0.2 (ref 0.0–1.0)
pH: 7 (ref 5.0–8.0)

## 2024-09-11 LAB — COMPREHENSIVE METABOLIC PANEL WITH GFR
ALT: 13 U/L (ref 0–35)
AST: 20 U/L (ref 0–37)
Albumin: 4 g/dL (ref 3.5–5.2)
Alkaline Phosphatase: 62 U/L (ref 39–117)
BUN: 8 mg/dL (ref 6–23)
CO2: 31 meq/L (ref 19–32)
Calcium: 9.6 mg/dL (ref 8.4–10.5)
Chloride: 103 meq/L (ref 96–112)
Creatinine, Ser: 0.52 mg/dL (ref 0.40–1.20)
GFR: 89.43 mL/min (ref >60.00)
Glucose, Bld: 117 mg/dL — ABNORMAL HIGH (ref 70–99)
Potassium: 4.3 meq/L (ref 3.5–5.1)
Sodium: 139 meq/L (ref 135–145)
Total Bilirubin: 0.7 mg/dL (ref 0.2–1.2)
Total Protein: 7.4 g/dL (ref 6.0–8.3)

## 2024-09-11 LAB — LIPID PANEL
Cholesterol: 163 mg/dL (ref 0–200)
HDL: 57.9 mg/dL (ref >39.00)
LDL Cholesterol: 85 mg/dL (ref 0–99)
NonHDL: 104.94
Total CHOL/HDL Ratio: 3
Triglycerides: 99 mg/dL (ref 0.0–149.0)
VLDL: 19.8 mg/dL (ref 0.0–40.0)

## 2024-09-11 MED ORDER — ALPRAZOLAM 0.25 MG PO TABS: ORAL_TABLET | ORAL | 0 refills | Status: AC

## 2024-09-11 NOTE — Progress Notes (Signed)
 Phone [phone] In person visit   Subjective:   Cynthia Berry is a 78 y.o. year old very pleasant female patient who presents for/with See problem oriented charting Chief Complaint  Patient presents with   Hypertension    6 month follow up;    Medical Management of Chronic Issues    Bilateral foot swelling more than normal but mostly in the right foot; has not been as active as usual after surgery;     Past Medical History-  Patient Active Problem List   Diagnosis Date Noted   Osteopenia 03/27/2019    Priority: Medium    Hx of adenomatous polyp of colon 04/30/2016    Priority: Medium    Gout 12/28/2014    Priority: Medium    Hyperlipidemia 09/30/2009    Priority: Medium    Depression 05/22/2007    Priority: Medium    Essential hypertension 05/22/2007    Priority: Medium    Allergic rhinitis 12/28/2014    Priority: Low   Hyperglycemia 08/31/2013    Priority: Low   Morbid obesity (HCC) 04/19/2017    Medications- reviewed and updated Current Outpatient Medications  Medication Sig Dispense Refill   acetaminophen  (TYLENOL ) 500 MG tablet Take 500-1,000 mg by mouth every 6 (six) hours as needed for moderate pain (pain score 4-6).     amLODipine  (NORVASC ) 2.5 MG tablet TAKE 1 TABLET BY MOUTH EVERY DAY 90 tablet 3   Ascorbic Acid (VITAMIN C) 1000 MG tablet Take 1,000 mg by mouth daily.     Cholecalciferol (VITAMIN D) 50 MCG (2000 UT) CAPS Take 2,000 Units by mouth daily.     escitalopram  (LEXAPRO ) 20 MG tablet TAKE 1 TABLET BY MOUTH EVERY DAY 90 tablet 3   loperamide (IMODIUM A-D) 2 MG tablet Take 2-4 mg by mouth 4 (four) times daily as needed for diarrhea or loose stools.     rosuvastatin  (CRESTOR ) 5 MG tablet TAKE 1 TABLET (5 MG TOTAL) BY MOUTH DAILY. 90 tablet 3   ALPRAZolam  (XANAX ) 0.25 MG tablet take 1 tablet by mouth twice a day if needed for anxiety. Do not drive for 8 hours after use. 40 tablet 0   No current facility-administered medications for this visit.      Objective:  BP 130/78 (BP Location: Left Arm, Patient Position: Sitting, Cuff Size: Normal)   Pulse 96   Temp 97.6 F (36.4 C) (Temporal)   Ht 5' 1 (1.549 m)   Wt 218 lb 12.8 oz (99.2 kg)   LMP  (LMP Unknown)   SpO2 97%   BMI 41.34 kg/m  Gen: NAD, resting comfortably CV: RRR no murmurs rubs or gallops Lungs: CTAB no crackles, wheeze, rhonchi Ext: trace edema Skin: warm, dry     Assessment and Plan   #Health maintenance- advised colonoscopy- last in 2017 but told 7 years  # History right shoulder reverse arthroplasty-last visit with Dr. Melita in October about 5 months out from surgery.  She has completed physical therapy and continues to workout at [facility] well -some swelling in shoulder -some swelling in feet with less activity- right side swells very slightly more  # Morbid obesity with BMI over 40-discussed healthy eating/regular exercise/weight loss  - weight up slightly likely related to surgery and activity decrease short term plus the holidays. Starting to pickup her activity in last 1.5 months.   #hypertension/history of gout on diuretic-no issues off S: medication: Amlodipine  2.5 mg daily - some swelling. No compression stockings BP Readings from Last 3 Encounters:  09/11/24 130/78  03/15/24 127/79  03/12/24 132/82  A/P: well controlled continue current medications but does have some swelling. Could try compression stockings -could try foot pedals- considered valsartan. Getting more active.   #hyperlipidemia S: Medication:Rosuvastatin  5 mg daily -Prior simvastatin  but she preferred not to take this along with amlodipine  Lab Results  Component Value Date   CHOL 152 05/09/2023   HDL 56.80 05/09/2023   LDLCALC 79 05/09/2023   LDLDIRECT 104.0 05/16/2018   TRIG 83.0 05/09/2023   CHOLHDL 3 05/09/2023  A/P:  lipids reasonably controlled- continue current medications as long as stable on labs today.     # Depression S: Medication:Escitalopram  20 mg - Situational  alprazolam  usually - once a month but was higher around surgery     09/11/2024    9:24 AM 03/12/2024    1:22 PM 09/30/2023    9:02 AM  Depression screen PHQ 2/9  Decreased Interest 0 0 0  Down, Depressed, Hopeless 0 0 0  PHQ - 2 Score 0 0 0  Altered sleeping 0 0 0  Tired, decreased energy 0 0 0  Change in appetite 0 0 0  Feeling bad or failure about yourself  0 0 0  Trouble concentrating 0 0 0  Moving slowly or fidgety/restless 0 0 0  Suicidal thoughts 0 0 0  PHQ-9 Score 0 0  0   Difficult doing work/chores Not difficult at all Not difficult at all Not difficult at all     Data saved with a previous flowsheet row definition  A/P: full remission- continue current medications     # Hyperglycemia/insulin resistance/prediabetes-A1c at least 5.7 in the past S:  Medication: None Lab Results  Component Value Date   HGBA1C 5.7 05/09/2023   HGBA1C 5.7 10/21/2021   HGBA1C 5.6 08/31/2019  A/P: hopefully stable- update a1c today. Continue current meds for now    Recommended follow up: No follow-ups on file. Future Appointments  Date Time Provider Department Center  10/15/2024  1:40 PM [facility] ANNUAL WELLNESS VISIT 1 [facility] [place]    Lab/Order associations: fasting   ICD-10-CM   1. Hyperglycemia  R73.9 Hemoglobin A1c    2. Screening for diabetes mellitus  Z13.1 Hemoglobin A1c    3. Essential hypertension  I10 Urinalysis, Routine w reflex microscopic    Lipid panel    Comprehensive metabolic panel    CBC with Differential/Platelet    4. Hyperlipidemia, unspecified hyperlipidemia type  E78.5 Urinalysis, Routine w reflex microscopic    Lipid panel    Comprehensive metabolic panel    CBC with Differential/Platelet    5. Morbid obesity (HCC)  E66.01       Meds ordered this encounter  Medications   ALPRAZolam  (XANAX ) 0.25 MG tablet    Sig: take 1 tablet by mouth twice a day if needed for anxiety. Do not drive for 8 hours after use.    Dispense:  40 tablet    Refill:  0     Return precautions advised.  Garnette Lukes, MD

## 2024-09-11 NOTE — Patient Instructions (Addendum)
 [facility] contact Dr. Avram Please call to schedule visit and/or procedure IF you do not hear within a week Address: [address] [place], [place], [place] [address] Phone: [phone]   Please stop by lab before you go If you have mychart- we will send your results within 3 business days of us  receiving them.  If you do not have mychart- we will call you about results within 5 business days of us  receiving them.  *please also note that you will see labs on mychart as soon as they post. I will later go in and write notes on them- will say notes from Dr. Katrinka    Recommended follow up: Return in about 6 months (around 03/12/2025) for followup or sooner if needed.Schedule b4 you leave.

## 2024-10-15 ENCOUNTER — Ambulatory Visit

## 2024-11-12 ENCOUNTER — Encounter: Payer: Self-pay | Admitting: Gastroenterology

## 2025-03-19 ENCOUNTER — Ambulatory Visit: Admitting: Family Medicine
